# Patient Record
Sex: Female | Born: 1965
Health system: Southern US, Community
[De-identification: ages and names within clinical notes are randomized; demographics above are authoritative.]

## PROBLEM LIST (undated history)

## (undated) DIAGNOSIS — K219 Gastro-esophageal reflux disease without esophagitis: Secondary | ICD-10-CM

## (undated) DIAGNOSIS — D069 Carcinoma in situ of cervix, unspecified: Secondary | ICD-10-CM

## (undated) DIAGNOSIS — H409 Unspecified glaucoma: Secondary | ICD-10-CM

## (undated) DIAGNOSIS — M810 Age-related osteoporosis without current pathological fracture: Secondary | ICD-10-CM

## (undated) DIAGNOSIS — D219 Benign neoplasm of connective and other soft tissue, unspecified: Secondary | ICD-10-CM

## (undated) DIAGNOSIS — T8859XA Other complications of anesthesia, initial encounter: Secondary | ICD-10-CM

## (undated) DIAGNOSIS — K589 Irritable bowel syndrome without diarrhea: Secondary | ICD-10-CM

## (undated) HISTORY — DX: Benign neoplasm of connective and other soft tissue, unspecified: D21.9

## (undated) HISTORY — DX: Gastro-esophageal reflux disease without esophagitis: K21.9

## (undated) HISTORY — DX: Carcinoma in situ of cervix, unspecified: D06.9

## (undated) HISTORY — PX: COLPOSCOPY: SHX161

## (undated) HISTORY — DX: Unspecified glaucoma: H40.9

## (undated) HISTORY — PX: SKIN CANCER EXCISION: SHX779

## (undated) HISTORY — PX: PELVIC LAPAROSCOPY: SHX162

## (undated) HISTORY — DX: Age-related osteoporosis without current pathological fracture: M81.0

## (undated) HISTORY — DX: Other disorders of iron metabolism: E83.19

## (undated) HISTORY — DX: Irritable bowel syndrome, unspecified: K58.9

---

## 1985-09-27 ENCOUNTER — Encounter: Payer: Self-pay | Admitting: Gastroenterology

## 1990-07-01 HISTORY — PX: PELVIC LAPAROSCOPY: SHX162

## 1997-07-01 HISTORY — PX: CERVICAL BIOPSY  W/ LOOP ELECTRODE EXCISION: SUR135

## 1998-05-08 ENCOUNTER — Other Ambulatory Visit: Admission: RE | Admit: 1998-05-08 | Discharge: 1998-05-08 | Payer: Self-pay | Admitting: Obstetrics and Gynecology

## 1998-12-13 ENCOUNTER — Other Ambulatory Visit: Admission: RE | Admit: 1998-12-13 | Discharge: 1998-12-13 | Payer: Self-pay | Admitting: Obstetrics and Gynecology

## 1999-02-28 ENCOUNTER — Other Ambulatory Visit: Admission: RE | Admit: 1999-02-28 | Discharge: 1999-02-28 | Payer: Self-pay | Admitting: Obstetrics and Gynecology

## 1999-07-13 ENCOUNTER — Other Ambulatory Visit: Admission: RE | Admit: 1999-07-13 | Discharge: 1999-07-13 | Payer: Self-pay | Admitting: Obstetrics and Gynecology

## 2000-04-23 ENCOUNTER — Other Ambulatory Visit: Admission: RE | Admit: 2000-04-23 | Discharge: 2000-04-23 | Payer: Self-pay | Admitting: Obstetrics and Gynecology

## 2000-07-18 ENCOUNTER — Encounter: Payer: Self-pay | Admitting: Obstetrics and Gynecology

## 2000-07-18 ENCOUNTER — Ambulatory Visit (HOSPITAL_COMMUNITY): Admission: RE | Admit: 2000-07-18 | Discharge: 2000-07-18 | Payer: Self-pay | Admitting: Obstetrics and Gynecology

## 2000-11-20 ENCOUNTER — Other Ambulatory Visit: Admission: RE | Admit: 2000-11-20 | Discharge: 2000-11-20 | Payer: Self-pay | Admitting: Obstetrics and Gynecology

## 2001-04-15 ENCOUNTER — Encounter: Payer: Self-pay | Admitting: Gastroenterology

## 2001-08-25 ENCOUNTER — Other Ambulatory Visit: Admission: RE | Admit: 2001-08-25 | Discharge: 2001-08-25 | Payer: Self-pay | Admitting: Obstetrics and Gynecology

## 2002-07-01 HISTORY — PX: EXPLORATORY LAPAROTOMY: SUR591

## 2002-09-13 ENCOUNTER — Other Ambulatory Visit: Admission: RE | Admit: 2002-09-13 | Discharge: 2002-09-13 | Payer: Self-pay | Admitting: Obstetrics and Gynecology

## 2003-04-08 ENCOUNTER — Inpatient Hospital Stay (HOSPITAL_COMMUNITY): Admission: RE | Admit: 2003-04-08 | Discharge: 2003-04-10 | Payer: Self-pay | Admitting: Obstetrics and Gynecology

## 2003-04-08 ENCOUNTER — Encounter (INDEPENDENT_AMBULATORY_CARE_PROVIDER_SITE_OTHER): Payer: Self-pay

## 2003-10-25 ENCOUNTER — Encounter: Payer: Self-pay | Admitting: Family Medicine

## 2003-12-28 ENCOUNTER — Other Ambulatory Visit: Admission: RE | Admit: 2003-12-28 | Discharge: 2003-12-28 | Payer: Self-pay | Admitting: Obstetrics and Gynecology

## 2004-01-31 ENCOUNTER — Encounter: Payer: Self-pay | Admitting: Family Medicine

## 2004-02-12 ENCOUNTER — Inpatient Hospital Stay (HOSPITAL_COMMUNITY): Admission: AD | Admit: 2004-02-12 | Discharge: 2004-02-12 | Payer: Self-pay | Admitting: Obstetrics and Gynecology

## 2004-08-02 ENCOUNTER — Ambulatory Visit (HOSPITAL_COMMUNITY): Admission: RE | Admit: 2004-08-02 | Discharge: 2004-08-02 | Payer: Self-pay | Admitting: Specialist

## 2005-02-04 ENCOUNTER — Other Ambulatory Visit: Admission: RE | Admit: 2005-02-04 | Discharge: 2005-02-04 | Payer: Self-pay | Admitting: Obstetrics and Gynecology

## 2005-08-28 ENCOUNTER — Encounter: Payer: Self-pay | Admitting: Family Medicine

## 2005-09-12 ENCOUNTER — Encounter: Payer: Self-pay | Admitting: Family Medicine

## 2005-09-19 ENCOUNTER — Encounter: Payer: Self-pay | Admitting: Family Medicine

## 2006-02-13 ENCOUNTER — Other Ambulatory Visit: Admission: RE | Admit: 2006-02-13 | Discharge: 2006-02-13 | Payer: Self-pay | Admitting: Obstetrics and Gynecology

## 2007-03-18 ENCOUNTER — Other Ambulatory Visit: Admission: RE | Admit: 2007-03-18 | Discharge: 2007-03-18 | Payer: Self-pay | Admitting: Obstetrics and Gynecology

## 2007-05-26 ENCOUNTER — Ambulatory Visit: Payer: Self-pay | Admitting: Gastroenterology

## 2007-05-26 LAB — CONVERTED CEMR LAB
ALT: 16 units/L (ref 0–35)
AST: 15 units/L (ref 0–37)
Albumin: 3.8 g/dL (ref 3.5–5.2)
Alkaline Phosphatase: 65 units/L (ref 39–117)
BUN: 7 mg/dL (ref 6–23)
Basophils Relative: 0.6 % (ref 0.0–1.0)
Bilirubin, Direct: 0.1 mg/dL (ref 0.0–0.3)
CO2: 32 meq/L (ref 19–32)
CRP, High Sensitivity: <1 — ABNORMAL LOW (ref 0.00–5.00)
Calcium: 9 mg/dL (ref 8.4–10.5)
Chloride: 104 meq/L (ref 96–112)
Creatinine, Ser: 0.7 mg/dL (ref 0.4–1.2)
Eosinophils Relative: 1.8 % (ref 0.0–5.0)
Ferritin: 9.9 ng/mL — ABNORMAL LOW (ref 10.0–291.0)
Folate: 12.2 ng/mL
GFR calc Af Amer: 118 mL/min
GFR calc non Af Amer: 98 mL/min
Glucose, Bld: 100 mg/dL — ABNORMAL HIGH (ref 70–99)
HCT: 41.7 % (ref 36.0–46.0)
Hemoglobin: 14.3 g/dL (ref 12.0–15.0)
Iron: 101 ug/dL (ref 42–145)
Lymphocytes Relative: 42.4 % (ref 12.0–46.0)
MCHC: 34.3 g/dL (ref 30.0–36.0)
MCV: 97.7 fL (ref 78.0–100.0)
Monocytes Relative: 4.8 % (ref 3.0–11.0)
Neutrophils Relative %: 50.4 % (ref 43.0–77.0)
Platelets: 277 10*3/uL (ref 150–400)
Potassium: 4 meq/L (ref 3.5–5.1)
RBC: 4.26 M/uL (ref 3.87–5.11)
RDW: 11.5 % (ref 11.5–14.6)
Saturation Ratios: 38.7 % (ref 20.0–50.0)
Sed Rate: 17 mm/hr (ref 0–25)
Sodium: 141 meq/L (ref 135–145)
TSH: 1.57 microintl units/mL (ref 0.35–5.50)
Tissue Transglutaminase Ab, IgA: 0.3 units (ref <7)
Total Bilirubin: 0.7 mg/dL (ref 0.3–1.2)
Total Protein: 6.8 g/dL (ref 6.0–8.3)
Transferrin: 186.4 mg/dL — ABNORMAL LOW (ref >=212.0)
Vitamin B-12: 178 pg/mL — ABNORMAL LOW (ref 211–911)
WBC: 9.8 10*3/uL (ref 4.5–10.5)

## 2007-05-27 ENCOUNTER — Ambulatory Visit (HOSPITAL_COMMUNITY): Admission: RE | Admit: 2007-05-27 | Discharge: 2007-05-27 | Payer: Self-pay | Admitting: Gastroenterology

## 2007-06-12 ENCOUNTER — Ambulatory Visit: Payer: Self-pay | Admitting: Gastroenterology

## 2007-06-19 DIAGNOSIS — R1032 Left lower quadrant pain: Secondary | ICD-10-CM | POA: Insufficient documentation

## 2007-06-19 DIAGNOSIS — N809 Endometriosis, unspecified: Secondary | ICD-10-CM | POA: Insufficient documentation

## 2007-06-19 DIAGNOSIS — K625 Hemorrhage of anus and rectum: Secondary | ICD-10-CM | POA: Insufficient documentation

## 2007-06-19 DIAGNOSIS — K5730 Diverticulosis of large intestine without perforation or abscess without bleeding: Secondary | ICD-10-CM | POA: Insufficient documentation

## 2007-06-19 DIAGNOSIS — K589 Irritable bowel syndrome without diarrhea: Secondary | ICD-10-CM | POA: Insufficient documentation

## 2008-04-12 ENCOUNTER — Encounter: Payer: Self-pay | Admitting: Obstetrics and Gynecology

## 2008-04-12 ENCOUNTER — Other Ambulatory Visit: Admission: RE | Admit: 2008-04-12 | Discharge: 2008-04-12 | Payer: Self-pay | Admitting: Obstetrics and Gynecology

## 2008-04-12 ENCOUNTER — Ambulatory Visit: Payer: Self-pay | Admitting: Obstetrics and Gynecology

## 2008-08-29 LAB — CONVERTED CEMR LAB: Pap Smear: NORMAL

## 2008-09-28 ENCOUNTER — Other Ambulatory Visit: Admission: RE | Admit: 2008-09-28 | Discharge: 2008-09-28 | Payer: Self-pay | Admitting: Obstetrics and Gynecology

## 2008-09-28 ENCOUNTER — Ambulatory Visit: Payer: Self-pay | Admitting: Obstetrics and Gynecology

## 2008-09-28 ENCOUNTER — Encounter: Payer: Self-pay | Admitting: Obstetrics and Gynecology

## 2008-12-19 ENCOUNTER — Telehealth (INDEPENDENT_AMBULATORY_CARE_PROVIDER_SITE_OTHER): Payer: Self-pay | Admitting: *Deleted

## 2008-12-19 ENCOUNTER — Ambulatory Visit: Payer: Self-pay | Admitting: Family Medicine

## 2008-12-19 DIAGNOSIS — M719 Bursopathy, unspecified: Secondary | ICD-10-CM

## 2008-12-19 DIAGNOSIS — R5381 Other malaise: Secondary | ICD-10-CM | POA: Insufficient documentation

## 2008-12-19 DIAGNOSIS — N390 Urinary tract infection, site not specified: Secondary | ICD-10-CM | POA: Insufficient documentation

## 2008-12-19 DIAGNOSIS — R5383 Other fatigue: Secondary | ICD-10-CM | POA: Insufficient documentation

## 2008-12-19 DIAGNOSIS — M67919 Unspecified disorder of synovium and tendon, unspecified shoulder: Secondary | ICD-10-CM | POA: Insufficient documentation

## 2009-03-08 ENCOUNTER — Ambulatory Visit: Payer: Self-pay | Admitting: Family Medicine

## 2009-03-08 DIAGNOSIS — J020 Streptococcal pharyngitis: Secondary | ICD-10-CM | POA: Insufficient documentation

## 2009-03-08 LAB — CONVERTED CEMR LAB: Rapid Strep: POSITIVE

## 2009-04-06 ENCOUNTER — Ambulatory Visit: Payer: Self-pay | Admitting: Family Medicine

## 2009-04-06 DIAGNOSIS — L600 Ingrowing nail: Secondary | ICD-10-CM | POA: Insufficient documentation

## 2009-04-17 ENCOUNTER — Ambulatory Visit: Payer: Self-pay | Admitting: Family Medicine

## 2009-04-17 DIAGNOSIS — M79609 Pain in unspecified limb: Secondary | ICD-10-CM | POA: Insufficient documentation

## 2009-04-24 ENCOUNTER — Ambulatory Visit: Payer: Self-pay | Admitting: Obstetrics and Gynecology

## 2009-04-24 ENCOUNTER — Other Ambulatory Visit: Admission: RE | Admit: 2009-04-24 | Discharge: 2009-04-24 | Payer: Self-pay | Admitting: Obstetrics and Gynecology

## 2009-04-24 ENCOUNTER — Encounter: Payer: Self-pay | Admitting: Obstetrics and Gynecology

## 2009-10-19 ENCOUNTER — Ambulatory Visit: Payer: Self-pay | Admitting: Family Medicine

## 2009-10-19 DIAGNOSIS — S92919A Unspecified fracture of unspecified toe(s), initial encounter for closed fracture: Secondary | ICD-10-CM | POA: Insufficient documentation

## 2010-04-23 ENCOUNTER — Ambulatory Visit: Payer: Self-pay | Admitting: Internal Medicine

## 2010-04-23 DIAGNOSIS — J069 Acute upper respiratory infection, unspecified: Secondary | ICD-10-CM | POA: Insufficient documentation

## 2010-05-02 ENCOUNTER — Other Ambulatory Visit: Admission: RE | Admit: 2010-05-02 | Discharge: 2010-05-02 | Payer: Self-pay | Admitting: Obstetrics and Gynecology

## 2010-05-02 ENCOUNTER — Ambulatory Visit: Payer: Self-pay | Admitting: Obstetrics and Gynecology

## 2010-05-04 ENCOUNTER — Ambulatory Visit: Payer: Self-pay | Admitting: Obstetrics and Gynecology

## 2010-08-02 NOTE — Assessment & Plan Note (Signed)
Summary: CONGESTION,ST/CLE   Vital Signs:  Patient profile:   45 year old female Weight:      188.50 pounds Temp:     98.7 degrees F oral Pulse rate:   76 / minute Pulse rhythm:   regular BP sitting:   110 / 72  (left arm) Cuff size:   large  Vitals Entered By: Selena Batten Dance CMA Duncan Dull) (April 23, 2010 9:10 AM) CC: Congestion   History of Present Illness: CC: congestion  3d h/o drainage back of throat, as well as congestion in head.  Feling achey, body pains, back pain.  No cough.  No fever.  just nasal congestion.  Has tried ibuprofen and nyquil.    No abd pain, n/v/d.  No rashes.  No significant sore throat.  + 2yo son sick at home.  + husband sick as well.  No smokers at home.  Current Medications (verified): 1)  Post-Operative Shoe .... Re: 826.0, Toe Fracture  Allergies: 1)  ! Sulfa  Past History:  Social History: Last updated: 12/19/2008 Occupation: Homemaker Married Never Smoked Alcohol use-no Drug use-no Regular exercise-yes  Review of Systems       per HPI  Physical Exam  General:  WDWN, congested Head:  Normocephalic and atraumatic without obvious abnormalities. No apparent alopecia or balding. Eyes:  vision grossly intact.   Ears:  External ear exam shows no significant lesions or deformities.  Otoscopic examination reveals clear canals, tympanic membranes are intact bilaterally without bulging, retraction, inflammation or discharge. Hearing is grossly normal bilaterally. Nose:  External nasal examination shows no deformity or inflammation. slight nasal erythema Mouth:  Oral mucosa and oropharynx without lesions or exudates.  Teeth in good repair.  slight pharyngeal erythema  MMM Neck:  supple.  minimal AC LAD (tender but no nodes palpated) Lungs:  Normal respiratory effort, chest expands symmetrically. Lungs are clear to auscultation, no crackles or wheezes. Heart:  Normal rate and regular rhythm. S1 and S2 normal without gallop, murmur, click, rub or  other extra sounds. Pulses:  2+ rad pulses Extremities:  no edema Skin:  Intact without suspicious lesions or rashes   Impression & Recommendations:  Problem # 1:  VIRAL URI (ICD-465.9) acute URTI.  treat supportively.  red flags to return disucssed.  to call at end of week if still with sxs, would be concern for possible sinusitis and consider abx treatment.  Complete Medication List: 1)  Post-operative Shoe  .... Re: 826.0, toe fracture  Patient Instructions: 1)  Sounds like you have a viral upper respiratory infection. 2)  Antibiotics are not needed for this.  Viral infections usually take 7-10 days to resolve. 3)  Use medication as prescribed: afrin nasal spray for 2-3 days max, push fluids as much as you can.  Continue ibuprofen.  guaifenesin 400mg  1 1/2 pill in am and at noon with plenty of fluids. 4)  Please return if you are not improving as expected, or if you have high fevers (>101.5) or difficulty swallowing, trouble breathing. 5)  Call us by end of week if not feeling better. 6)  Call clinic with questions.  Pleasure to see you today!    Orders Added: 1)  Est. Patient Level III [99213]    Prior Medications: POST-OPERATIVE SHOE () Re: 826.0, toe fracture Current Allergies (reviewed today): ! SULFA

## 2010-08-02 NOTE — Miscellaneous (Signed)
Summary: Vaccine Records/[facility].  Vaccine Records/[facility].   Imported By: Lanelle Bal 12/20/2008 14:30:42  _____________________________________________________________________  External Attachment:    Type:   Image     Comment:   External Document

## 2010-08-02 NOTE — Assessment & Plan Note (Signed)
Summary: STREP THROAT/[facility]   Vital Signs:  Patient profile:   45 year old female Height:      67 inches Weight:      183 pounds BMI:     28.77 Temp:     98.9 degrees F oral Pulse rate:   84 / minute Pulse rhythm:   regular BP sitting:   100 / 70  (left arm) Cuff size:   regular  Vitals Entered By: Lewanda Rife LPN (March 08, 2009 9:52 AM)  CC:  sorethroat.  History of Present Illness: Here for ST--onset x 2d--aching and ST, fever--worse today --no known strep exposure --son  1 and 1/2 yrs old on ABT currently for ear infection since end of last week --hurts to swallow--cereal this morning  --vomited yesterday morning with post nasal drip  Allergies: 1)  ! Sulfa  Review of Systems      See HPI  Physical Exam  General:  alert, well-developed, well-nourished, and well-hydrated.  sick Ears:  R ear normal and L ear normal.   Nose:  no airflow obstruction, mucosal erythema, and mucosal edema.  sinuses +,- Mouth:  no exudates and pharyngeal erythema.   Lungs:  normal respiratory effort, no intercostal retractions, no accessory muscle use, and normal breath sounds.  moist cough only Cervical Nodes:  shotty tender tonsilar nodes, no anterior cervical adenopathy and no posterior cervical adenopathy.   Psych:  Oriented X3 and good eye contact.     Impression & Recommendations:  Problem # 1:  STREPTOCOCCAL PHARYNGITIS (ICD-034.0) Assessment New  will start on amoxicillin two times a day x 10d gargle freq soft simple foods as tolerated and increased by mouth fluids tylenol or IBP as needed fever The following medications were removed from the medication list:    Augmentin 875-125 Mg Tabs (Amoxicillin-pot clavulanate) .Marland Kitchen... 1 by mouth two times a day Her updated medication list for this problem includes:    Diclofenac Sodium 75 Mg Tbec (Diclofenac sodium) .Marland Kitchen... 1 by mouth two times a day    Motrin Ib 200 Mg Tabs (Ibuprofen) .Marland Kitchen... Three caplets every six hours  Amoxicillin 500 Mg Caps (Amoxicillin) .Marland Kitchen... Take 2 two times a day  Orders: Rapid Strep (16109)  Complete Medication List: 1)  Diclofenac Sodium 75 Mg Tbec (Diclofenac sodium) .Marland Kitchen.. 1 by mouth two times a day 2)  Motrin Ib 200 Mg Tabs (Ibuprofen) .... Three caplets every six hours 3)  Amoxicillin 500 Mg Caps (Amoxicillin) .... Take 2 two times a day Prescriptions: AMOXICILLIN 500 MG CAPS (AMOXICILLIN) take 2 two times a day  #40 x 0   Entered and Authorized by:   Gildardo Griffes FNP   Signed by:   Gildardo Griffes FNP on 03/08/2009   Method used:   Electronically to        [facility]* (retail)       [address]       [place], [place]  [address]       Ph: [phone]       Fax: [phone]   RxID:   [id number]   Current Allergies (reviewed today): ! SULFA  Laboratory Results  Date/Time Received: March 08, 2009 9:54 AM  Date/Time Reported: March 08, 2009 9:54 AM   Other Tests  Rapid Strep: positive  Kit Test Internal QC: Positive   (Normal Range: Negative)

## 2010-08-02 NOTE — Assessment & Plan Note (Signed)
Summary: NEW PT TO EST/CLE   Vital Signs:  Patient profile:   45 year old female Height:      67 inches Weight:      181.0 pounds BMI:     28.45 Temp:     98.2 degrees F oral Pulse rate:   88 / minute Pulse rhythm:   regular BP sitting:   110 / 70  (left arm) Cuff size:   regular  History of Present Illness: Chief complaint new patient  45 year old female:  former patient of Dr. Artis Flock.   Before she left [place] she got terribly sick. Lost her voice, and a lot of swollen gland and phlegm going down her throat. Second week in May went to the urgent care in [place]. Put on some Amox for ten days. this is now been over a month, and she continues to have lymphadenopathy, doesn't feel particularly well as when going down her throat and has some occasional cough. She overall feels sort of weak and has some aches and pains.  R shoulder RTC: no specific instance of injury, however the patient does have a new child, 63-month-old, and has been lifting the child significantly. She does have pain with overhead motion and pain with reaching across her body. She denies a prior dislocation, subluxation, or traumatic fracture. At this point she is only tried some occasional over-the-counter medications, and she is done in rehabilitation. He does not wake her up at night, however most of the pain is anterior and somewhat lateral as well. No numbness or tingling   Preventive Screening-Counseling & Management  Alcohol-Tobacco     Smoking Status: never  Caffeine-Diet-Exercise     Caffeine use/day: none     Does Patient Exercise: yes  Safety-Violence-Falls     Seat Belt Use: yes      Drug Use:  no.    Allergies (verified): 1)  ! Sulfa  Past History:  Past Medical History: UTI'S, CHRONIC IRRITABLE BOWEL SYNDROME DIVERTICULOSIS OF COLON Family Hx of CARCINOMA IN SITU OF COLON  ENDOMETRIOSIS   Past Surgical History: OCT 2004 removal of fibroid tumor in uterus 1992 laproscopy for  endometriosis repeated in 1998  Family History: Family History of Arthritis Family History of Neurological disorder (alzeheimers)  Social History: Occupation: Futures trader Married Never Smoked Alcohol use-no Drug use-no Regular exercise-yes Occupation:  stay at home mom Smoking Status:  never Drug Use:  no Does Patient Exercise:  yes Ethnicity:  White Risk analyst Use:  yes Alcohol:  None Caffeine use/day:  none Education:  College  Review of Systems       REVIEW OF SYSTEMS  GEN: No systemic complaints, no fevers, chills, sweats, or other acute illnesses MSK: Detailed in the HPI GI: tolerating PO intake without difficulty Neuro: No numbness, parasthesias, or tingling associated. Otherwise the pertinent positives of the ROS are noted above.   General:  Complains of fatigue and fever. ENT:  Complains of nasal congestion, postnasal drainage, and sore throat. CV:  Denies chest pain or discomfort. Resp:  Complains of cough.  Physical Exam  General:  Well-developed,well-nourished,in no acute distress; alert,appropriate and cooperative throughout examination Head:  Normocephalic and atraumatic without obvious abnormalities. No apparent alopecia or balding. Eyes:  vision grossly intact.   Ears:  External ear exam shows no significant lesions or deformities.  Otoscopic examination reveals clear canals, tympanic membranes are intact bilaterally without bulging, retraction, inflammation or discharge. Hearing is grossly normal bilaterally. Nose:  External nasal examination shows no deformity  or inflammation. Nasal mucosa are pink and moist without lesions or exudates. Mouth:  Oral mucosa and oropharynx without lesions or exudates.  Teeth in good repair. Neck:  supple.  some LAD Lungs:  Normal respiratory effort, chest expands symmetrically. Lungs are clear to auscultation, no crackles or wheezes. Heart:  Normal rate and regular rhythm. S1 and S2 normal without gallop, murmur, click, rub  or other extra sounds. Abdomen:  Bowel sounds positive,abdomen soft and non-tender without masses, organomegaly or hernias noted. Msk:  Shoulder: R Inspection: No muscle wasting or winging Ecchymosis/edema: neg  AC joint, scapula, clavicle: NT Cervical spine: NT, full ROM Spurling's: neg Abduction: full, 5/5 Flexion: full, 5/5 IR, full, lift-off: 5/5 ER at neutral: full, 5/5 AC crossover: POSITVE Neer: pos Hawkins: pos Drop Test: neg Empty Can: pos Supraspinatus insertion: mild-mod T Bicipital groove: NT Speed's: neg Yergason's: neg Sulcus sign: neg Scapular dyskinesis: none C5-T1 intact  Neuro: Sensation intact Grip 5/5  Extremities:  no edema Neurologic:  alert & oriented X3.   Psych:  Cognition and judgment appear intact. Alert and cooperative with normal attention span and concentration. No apparent delusions, illusions, hallucinations   Impression & Recommendations:  Problem # 1:  ROTATOR CUFF SYNDROME, RIGHT (ICD-726.10) Assessment New Shoulder anatomy was reviewed with the patient using and anatomical model.   Rotator cuff strengthening and scapular stabilization exercises were reviewed with the patient.  A handout was given based on a shoulder program from Dr. Graciella Freer of [facility] and the [facility].  Retraining shoulder mechanics and function was emphasized to the patient with rehab done at least 5-6 days a week.  The patient could benefit from formal PT to assist with scapular stabilization and RTC strengthening.   Problem # 2:  FATIGUE (ICD-780.79) Assessment: New ongoing illness with lymphadenopathy, given the length of illness, greater than one-month, I think a course of antibiotics is reasonable. We'll try to broaden his coverage with Augmentin.  Also given Diflucan in case yeast infection ensues.  very well could be viral illness such as CMV, ebv, or other illness contracted while in [place]. if she continues to do poorly, I would have to do a  broad lab work evaluation to try to identify cause, however at this point after our discussion, she and I are both comfortable with doing some trial prior to do an extensive laboratory workup.  Complete Medication List: 1)  Diclofenac Sodium 75 Mg Tbec (Diclofenac sodium) .Marland Kitchen.. 1 by mouth two times a day 2)  Augmentin 875-125 Mg Tabs (Amoxicillin-pot clavulanate) .Marland Kitchen.. 1 by mouth two times a day 3)  Fluconazole 150 Mg Tabs (Fluconazole) .Marland Kitchen.. 1 by mouth x 1  Patient Instructions: 1)  follow-up 6 weeks Prescriptions: FLUCONAZOLE 150 MG TABS (FLUCONAZOLE) 1 by mouth x 1  #1 x 1   Entered and Authorized by:   Hannah Beat MD   Signed by:   Hannah Beat MD on 12/19/2008   Method used:   Print then Give to Patient   RxID:   [id number] AUGMENTIN 875-125 MG TABS (AMOXICILLIN-POT CLAVULANATE) 1 by mouth two times a day  #20 x 0   Entered and Authorized by:   Hannah Beat MD   Signed by:   Hannah Beat MD on 12/19/2008   Method used:   Print then Give to Patient   RxID:   [id number] DICLOFENAC SODIUM 75 MG TBEC (DICLOFENAC SODIUM) 1 by mouth two times a day  #60 x 1   Entered and Authorized by:  Hannah Beat MD   Signed by:   Hannah Beat MD on 12/19/2008   Method used:   Print then Give to Patient   RxID:   [id number]   Prior Medications (reviewed today): None Current Allergies (reviewed today): ! SULFA  Preventive Care Screening  Pap Smear:    Date:  08/29/2008    Next Due:  08/2009    Results:  normal   Mammogram:    Date:  01/30/2008    Next Due:  01/2009    Results:  normal   Last Tetanus Booster:    Date:  09/29/2008    Results:  Tdap   Prior Values:    Colonoscopy:  Results: Normal. Location:  [facility] .   (04/15/2001)   Prevention & Chronic Care Immunizations   Influenza vaccine: Not documented    Tetanus booster: 09/29/2008: Tdap    Pneumococcal vaccine: Not documented  Other Screening   Pap smear: normal   (08/29/2008)   Pap smear due: 08/2009    Mammogram: normal  (01/30/2008)   Mammogram due: 01/2009    Smoking status: never  (12/19/2008)  Lipids   Total Cholesterol: Not documented   LDL: Not documented   LDL Direct: Not documented   HDL: Not documented   Triglycerides: Not documented

## 2010-08-02 NOTE — Letter (Signed)
Summary: [facility]  [facility]   Imported By: Lanelle Bal 02/06/2009 13:00:23  _____________________________________________________________________  External Attachment:    Type:   Image     Comment:   External Document

## 2010-08-02 NOTE — Letter (Signed)
Summary: Bradd Canary, MD  Bradd Canary, MD   Imported By: Lanelle Bal 02/06/2009 13:06:43  _____________________________________________________________________  External Attachment:    Type:   Image     Comment:   External Document

## 2010-08-02 NOTE — Procedures (Signed)
Summary: Gastroenterology EGD  Gastroenterology EGD   Imported By: Francee Piccolo CMA 07/16/2007 14:21:33  _____________________________________________________________________  External Attachment:    Type:   Image     Comment:   External Document

## 2010-08-02 NOTE — Assessment & Plan Note (Signed)
Summary: INFECTED TOENAIL/[facility]   Vital Signs:  Patient profile:   45 year old female Height:      67 inches Weight:      184.8 pounds BMI:     29.05 Temp:     98.3 degrees F oral Pulse rate:   88 / minute Pulse rhythm:   regular BP sitting:   106 / 70  (left arm) Cuff size:   regular  Vitals Entered By: Benny Lennert CMA Duncan Dull) (April 06, 2009 8:56 AM)  History of Present Illness: Chief complaint infected big toe  Over past few months... nail darkened, has had ingrown toenail on right edge on 1st digit.  Has seen oozing yellow/clear bloody fluid. Very tender, warm.  Has self treated with nail clippers and warm compresses./soaks. 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  No fever.    Has history of toenail fungus.Jennifer Kitchen3-4 years ago.   Problems Prior to Update: 1)  Streptococcal Pharyngitis  (ICD-034.0) 2)  Rotator Cuff Syndrome, Right  (ICD-726.10) 3)  Fatigue  (ICD-780.79) 4)  Uti's, Chronic  (ICD-599.0) 5)  Irritable Bowel Syndrome  (ICD-564.1) 6)  Diverticulosis of Colon  (ICD-562.10) 7)  Rectal Bleeding  (ICD-569.3) 8)  Fh of Carcinoma in Situ of Colon  (ICD-230.3) 9)  Abdominal Pain, Left Lower Quadrant  (ICD-789.04) 10)  S/P Laparoscopy With Excision of Endometrial Tissue  () 11)  Endometriosis  (ICD-617.9)  Current Medications (verified): 1)  Keflex 500 Mg Caps (Cephalexin) .Jennifer Velez.. 1 Tab By Mouth Three Times A Day X 7 Days  Allergies: 1)  ! Sulfa  Past History:  Past medical, surgical, family and social histories (including risk factors) reviewed, and no changes noted (except as noted below).  Past Medical History: Reviewed history from 01/30/2009 and no changes required. UTI'S, CHRONIC IRRITABLE BOWEL SYNDROME DIVERTICULOSIS OF COLON Family Hx of CARCINOMA IN SITU OF COLON  ENDOMETRIOSIS  h/o BPPV  Past Surgical History: Reviewed history from 12/19/2008 and no changes required. OCT 2004 removal of fibroid tumor  in uterus 1992 laproscopy for endometriosis repeated in 1998  Family History: Reviewed history from 12/19/2008 and no changes required. Family History of Arthritis Family History of Neurological disorder (alzeheimers)  Social History: Reviewed history from 12/19/2008 and no changes required. Occupation: Homemaker Married Never Smoked Alcohol use-no Drug use-no Regular exercise-yes  Review of Systems General:  Denies fatigue and fever. CV:  Denies chest pain or discomfort.  Physical Exam  General:  Well-developed,well-nourished,in no acute distress; alert,appropriate and cooperative throughout examination Pulses:  R and L posterior tibial pulses are full and equal bilaterally  Extremities:  right 1st digit, medial edge... .Jennifer Kitchenno fluctuance but indurated tissue medially Clearyellow tinged dried drainage at edge   Impression & Recommendations:  Problem # 1:  INGROWN TOENAIL, INFECTED (ICD-703.0) No clear abcess/paronycia that needs to be drained. Ingrown toenail is primary issue...will treat with warm compresses oral antibiotic, but pt needs to return for toenail removal.  The following medications were removed from the medication list:    Amoxicillin 500 Mg Caps (Amoxicillin) .Jennifer Velez... Take 2 two times a day Her updated medication list for this problem includes:    Keflex 500 Mg Caps (Cephalexin) .Jennifer Velez... 1 tab by mouth three times a day x 7 days  Complete Medication List: 1)  Keflex 500 Mg Caps (Cephalexin) .Jennifer Velez.. 1 tab by mouth three times a day x 7 days  Patient Instructions: 1)  Warm compresses three times a day.  Apply antibiotic ointment. 2)  Treeat with oral antibiotic initially.  3)  Return in 1 week for ingrown toenail.Please have patient make the appointment with Dr. Patsy Lager.  Prescriptions: KEFLEX 500 MG CAPS (CEPHALEXIN) 1 tab by mouth three times a day x 7 days  #21 x 0   Entered and Authorized by:   Kerby Nora MD   Signed by:   Kerby Nora MD on 04/06/2009   Method  used:   Electronically to        [facility]* (retail)       [address]       [place], [place]  [address]       Ph: [phone]       Fax: [phone]   RxID:   [id number]   Current Allergies (reviewed today): ! SULFA

## 2010-08-02 NOTE — Progress Notes (Signed)
Summary: rxn to med  Phone Note Call from Patient Call back at Home Phone [phone]   Caller: Spouse- hank Call For: [place] Summary of Call: pt seen today given med, husband states pt c/o abd pain, diarrhea, dizziness, and itching  since taking med. please advise, [place] [facility] Initial call taken by: Liane Comber,  December 19, 2008 2:38 PM  Follow-up for Phone Call        have her d/c both the voltaren and augmentin  Instead of Augmentin - call in Levaquin 500 mg, 1 by mouth daily x 10 days.  Instead of voltaren, can take ibuprofen or alleve over the counter.  I would stop all of them now, OK to take Benadryl. Unusual reaction - Voltaren can cause nausea in some people (dicolfenac).  So, Stop all meds, drink fluids, take benadryl. If trouble breathing, to ER. Basic nausea, care, BRAT diet Follow-up by: Hannah Beat MD,  December 19, 2008 2:51 PM  Additional Follow-up for Phone Call Additional follow up Details #1::        patient advised and medication called in Additional Follow-up by: Benny Lennert CMA,  December 19, 2008 4:29 PM

## 2010-08-02 NOTE — Assessment & Plan Note (Signed)
Summary: REMOVE TOENAIL   History of Present Illness: 45 year old female presents with R great toe f/u for ingrown toenail.  R medial, has been ongoing now for about 6 months - placed on ABX by Dr. Ermalene Searing -- not sure if this has significantly improved.   Continues to have some toe pain.  Allergies: 1)  ! Sulfa  Past History:  Past medical, surgical, family and social histories (including risk factors) reviewed, and no changes noted (except as noted below).  Past Medical History: Reviewed history from 01/30/2009 and no changes required. UTI'S, CHRONIC IRRITABLE BOWEL SYNDROME DIVERTICULOSIS OF COLON Family Hx of CARCINOMA IN SITU OF COLON  ENDOMETRIOSIS  h/o BPPV  Past Surgical History: Reviewed history from 12/19/2008 and no changes required. OCT 2004 removal of fibroid tumor in uterus 1992 laproscopy for endometriosis repeated in 1998  Family History: Reviewed history from 12/19/2008 and no changes required. Family History of Arthritis Family History of Neurological disorder (alzeheimers)  Social History: Reviewed history from 12/19/2008 and no changes required. Occupation: Homemaker Married Never Smoked Alcohol use-no Drug use-no Regular exercise-yes  Review of Systems       Toe pain no f/c/sweats no n/v/d  Physical Exam  General:  GEN: Well-developed,well-nourished,in no acute distress; alert,appropriate and cooperative throughout examination HEENT: Normocephalic and atraumatic without obvious abnormalities. No apparent alopecia or balding. Ears, externally no deformities PULM: Breathing comfortably in no respiratory distress EXT: No clubbing, cyanosis, or edema PSYCH: Normally interactive. Cooperative during the interview. Pleasant. Friendly and conversant. Not anxious or depressed appearing. Normal, full affect.  Skin:  R great toe with medial redness, swelling, painful to touch, some crusting at edge of nail.   Impression &  Recommendations:  Problem # 1:  PAIN IN SOFT TISSUES OF LIMB (ICD-729.5) Assessment Unchanged Toe pain, Tylenol, Motrin, Vicodin as needed   Problem # 2:  INGROWN TOENAIL, INFECTED (ICD-703.0)  Medial R toenail removed  see below  Her updated medication list for this problem includes:    Keflex 500 Mg Caps (Cephalexin) .Marland Kitchen... 1 tab by mouth three times a day x 7 days  Orders: Nail avulsion, partial or complete (11730)  Complete Medication List: 1)  Keflex 500 Mg Caps (Cephalexin) .Marland Kitchen.. 1 tab by mouth three times a day x 7 days 2)  Hydrocodone-acetaminophen 5-325 Mg Tabs (Hydrocodone-acetaminophen) .Marland Kitchen.. 1 - 2  by mouth up to 4 times per day as needed for pain Prescriptions: HYDROCODONE-ACETAMINOPHEN 5-325 MG TABS (HYDROCODONE-ACETAMINOPHEN) 1 - 2  by mouth up to 4 times per day as needed for pain  #30 x 0   Entered and Authorized by:   Hannah Beat MD   Signed by:   Hannah Beat MD on 04/17/2009   Method used:   Print then Give to Patient   RxID:   [id number]      PROCEDURE:  Toenail Removal Site: R great toenail Anesthesia: 9 cc of 1/2 Marcaine 0.5% and 1/2 Lidocaine 1% in a digital block Procedure: Prepped with betadine x 4, toenail lifters used without difficulty and toenail cutters used to remove medial nail to base. Hypertrophic tissue removed medially with scissors. Phenol applied to base of nail x 2. No complications and minimal bleeding. Disposition: To house

## 2010-08-02 NOTE — Letter (Signed)
Summary: [facility]  [facility]   Imported By: Lanelle Bal 02/06/2009 13:02:53  _____________________________________________________________________  External Attachment:    Type:   Image     Comment:   External Document

## 2010-08-02 NOTE — Procedures (Signed)
Summary: [facility]   Imported By: Francee Piccolo CMA 07/16/2007 14:17:43  _____________________________________________________________________  External Attachment:    Type:   Image     Comment:   External Document

## 2010-08-02 NOTE — Assessment & Plan Note (Signed)
Summary: BROKEN TOE ON LEFT FOOT/[facility]   Vital Signs:  Patient profile:   45 year old female Height:      67 inches Weight:      184.8 pounds BMI:     29.05 Temp:     98.1 degrees F oral Pulse rate:   80 / minute Pulse rhythm:   regular BP sitting:   108 / 82  (left arm) Cuff size:   regular  Vitals Entered By: Benny Lennert CMA Duncan Dull) (October 19, 2009 12:16 PM)  History of Present Illness: Chief complaint ? broken toe  The patient injured her 4th toe approx 2 weeks ago, DOI 08/14/2009.  L 4th toe has been swollen, tender and bruised.  Has been wearing a post-operative shoe that is now falling apart.   h/o multiple fx toes in the past.   REVIEW OF SYSTEMS  GEN: No systemic complaints, no fevers, chills, sweats, or other acute illnesses MSK: Detailed in the HPI GI: tolerating PO intake without difficulty Neuro: No numbness, parasthesias, or tingling associated. Otherwise the pertinent positives of the ROS are noted above.    Allergies: 1)  ! Sulfa  Past History:  Past medical, surgical, family and social histories (including risk factors) reviewed, and no changes noted (except as noted below).  Past Medical History: Reviewed history from 01/30/2009 and no changes required. UTI'S, CHRONIC IRRITABLE BOWEL SYNDROME DIVERTICULOSIS OF COLON Family Hx of CARCINOMA IN SITU OF COLON  ENDOMETRIOSIS  h/o BPPV  Past Surgical History: Reviewed history from 12/19/2008 and no changes required. OCT 2004 removal of fibroid tumor in uterus 1992 laproscopy for endometriosis repeated in 1998  Family History: Reviewed history from 12/19/2008 and no changes required. Family History of Arthritis Family History of Neurological disorder (alzeheimers)  Social History: Reviewed history from 12/19/2008 and no changes required. Occupation: Homemaker Married Never Smoked Alcohol use-no Drug use-no Regular exercise-yes  Physical Exam  General:  GEN:  Well-developed,well-nourished,in no acute distress; alert,appropriate and cooperative throughout examination HEENT: Normocephalic and atraumatic without obvious abnormalities. No apparent alopecia or balding. Ears, externally no deformities PULM: Breathing comfortably in no respiratory distress EXT: No clubbing, cyanosis, or edema PSYCH: Normally interactive. Cooperative during the interview. Pleasant. Friendly and conversant. Not anxious or depressed appearing. Normal, full affect.  Msk:  Echymosis: no Edema: no ROM: full LE B Gait: heel toe, non-antalgic MT pain: no Callus pattern: none Lateral Mall: NT Medial Mall: NT Talus: NT Navicular: NT Cuboid: NT Calcaneous: NT Metatarsals: NT 5th MT: NT Phalanges: MARKEDLY TENDER ON L 4TH TOE Fat Pad: NT Peroneals: NT Post Tib: NT Great Toe: Nml motion Ant Drawer: neg ATFL: NT CFL: NT Deltoid: NT    Impression & Recommendations:  Problem # 1:  CLOSED FRACTURE OF ONE OR MORE PHALANGES OF FOOT (ICD-826.0) Assessment New Xray, 4th toe Indication: pain Findings: There is evidence of a minimally displaced, minimally angulated stable fracture IP fracture of the 4th toe  The patient will be placed in a post-operative shoe for immobilization, cont to wear for the next 2-3 weeks. Tape as able with tearable athletic tape.  A UNIVERSAL FRACTURE CHARGE HAS BEEN APPLIED IN THE CARE OF THIS INJURY.  No co-pay should be applied in the future, and there is a 90 day follow-up period for subsequent care of this injury.   Orders: Toe Fx (36644)  Complete Medication List: 1)  Post-operative Shoe  .... Re: 826.0, toe fracture  Other Orders: Radiology other (Radiology Other) Prescriptions: POST-OPERATIVE SHOE Re: 826.0, toe  fracture  #1 x 0   Entered and Authorized by:   Hannah Beat MD   Signed by:   Hannah Beat MD on 10/19/2009   Method used:   Print then Give to Patient   RxID:   [id number]   Current Allergies (reviewed  today): ! SULFA

## 2010-09-07 ENCOUNTER — Other Ambulatory Visit: Payer: Self-pay | Admitting: Family Medicine

## 2010-09-07 ENCOUNTER — Encounter: Payer: Self-pay | Admitting: Family Medicine

## 2010-09-07 ENCOUNTER — Ambulatory Visit (INDEPENDENT_AMBULATORY_CARE_PROVIDER_SITE_OTHER): Payer: 59 | Admitting: Family Medicine

## 2010-09-07 DIAGNOSIS — J029 Acute pharyngitis, unspecified: Secondary | ICD-10-CM | POA: Insufficient documentation

## 2010-09-07 LAB — MONONUCLEOSIS SCREEN: Mono Screen: NEGATIVE

## 2010-09-07 LAB — CONVERTED CEMR LAB: Rapid Strep: POSITIVE

## 2010-09-08 ENCOUNTER — Encounter: Payer: Self-pay | Admitting: Family Medicine

## 2010-09-11 NOTE — Assessment & Plan Note (Signed)
Summary: ST,HA/CLE   [facility]   Vital Signs:  Patient profile:   45 year old female Height:      67 inches Weight:      181 pounds BMI:     28.45 Temp:     98.4 degrees F oral Pulse rate:   76 / minute Pulse rhythm:   regular BP sitting:   100 / 70  (left arm) Cuff size:   large  Vitals Entered By: Benny Lennert CMA Duncan Dull) (September 07, 2010 9:59 AM)  History of Present Illness: Chief complaint sore throat, headache  Acute Visit History:      The patient complains of headache and sore throat.  These symptoms began 2 weeks ago.  She denies cough, earache, fever, nasal discharge, and nausea.  Other comments include: swollen glands.Marland Kitchen and tender. OSme post nasal drip.        Problems Prior to Update: 1)  Viral Uri  (ICD-465.9) 2)  Closed Fracture of One or More Phalanges of Foot  (ICD-826.0) 3)  Pain in Soft Tissues of Limb  (ICD-729.5) 4)  Pain in Soft Tissues of Limb  (ICD-729.5) 5)  Ingrown Toenail, Infected  (ICD-703.0) 6)  Streptococcal Pharyngitis  (ICD-034.0) 7)  Rotator Cuff Syndrome, Right  (ICD-726.10) 8)  Fatigue  (ICD-780.79) 9)  Uti's, Chronic  (ICD-599.0) 10)  Irritable Bowel Syndrome  (ICD-564.1) 11)  Diverticulosis of Colon  (ICD-562.10) 12)  Rectal Bleeding  (ICD-569.3) 13)  Fh of Carcinoma in Situ of Colon  (ICD-230.3) 14)  Abdominal Pain, Left Lower Quadrant  (ICD-789.04) 15)  S/P Laparoscopy With Excision of Endometrial Tissue  () 16)  Endometriosis  (ICD-617.9)  Current Medications (verified): 1)  Amoxicillin 500 Mg Tabs (Amoxicillin) .... 2 Tab By Mouth Two Times A Day X 10 Days  Allergies: 1)  ! Sulfa  Past History:  Past medical, surgical, family and social histories (including risk factors) reviewed, and no changes noted (except as noted below).  Past Medical History: Reviewed history from 01/30/2009 and no changes required. UTI'S, CHRONIC IRRITABLE BOWEL SYNDROME DIVERTICULOSIS OF COLON Family Hx of CARCINOMA IN SITU OF COLON  ENDOMETRIOSIS    h/o BPPV  Past Surgical History: Reviewed history from 12/19/2008 and no changes required. OCT 2004 removal of fibroid tumor in uterus 1992 laproscopy for endometriosis repeated in 1998  Family History: Reviewed history from 12/19/2008 and no changes required. Family History of Arthritis Family History of Neurological disorder (alzeheimers)  Social History: Reviewed history from 12/19/2008 and no changes required. Occupation: Homemaker Married Never Smoked Alcohol use-no Drug use-no Regular exercise-yes  Review of Systems      See HPI       facial headache and neck soreness General:  Complains of fatigue; full ROM of neck. CV:  Denies chest pain or discomfort. Resp:  Denies shortness of breath, sputum productive, and wheezing.  Physical Exam  General:  Well-developed,well-nourished,in no acute distress; alert,appropriate and cooperative throughout examination Nose:  External nasal examination shows no deformity or inflammation. Nasal mucosa are pink and moist without lesions or exudates. Mouth:  pharynx pink and moist, no exudates, and no posterior lymphoid hypertrophy.   Neck:  cervical lymphadenopathy.   Lungs:  Normal respiratory effort, chest expands symmetrically. Lungs are clear to auscultation, no crackles or wheezes. Heart:  Normal rate and regular rhythm. S1 and S2 normal without gallop, murmur, click, rub or other extra sounds. Pulses:  R and L posterior tibial pulses are full and equal bilaterally  Extremities:  no edema  Impression & Recommendations:  Problem # 1:  PHARYNGITIS, ACUTE (ICD-462) Strep test was borderline positive ... has some symptoms that correspon with this diagnosis.Marland Kitchen but chronicity of symptoms and no fever is unusual.. also having some PND.   Will send for strep culture... check mono as well given fatigue. Will treat emperically with amox.  Her updated medication list for this problem includes:    Amoxicillin 500 Mg Tabs (Amoxicillin)  .Marland Kitchen... 2 tab by mouth two times a day x 10 days  Orders: TLB-Mono Test (Monospot) [id number]) T-Culture, Throat ([id number])  Complete Medication List: 1)  Amoxicillin 500 Mg Tabs (Amoxicillin) .... 2 tab by mouth two times a day x 10 days  Other Orders: Rapid Strep ([id number])  Patient Instructions: 1)  Start antibiotic.Marland Kitchen we will call with lab results early next week. 2)  Call if fever on antibiotics.Marland Kitchen or headhace worsening.  3)  Ibuprofen as needed sore throat. Prescriptions: AMOXICILLIN 500 MG TABS (AMOXICILLIN) 2 tab by mouth two times a day x 10 days  #40 x 0   Entered and Authorized by:   Kerby Nora MD   Signed by:   Kerby Nora MD on 09/07/2010   Method used:   Electronically to        [facility]* (retail)       [address]       [place], [place]  [address]       Ph: [phone]       Fax: [phone]   RxID:   [id number]    Orders Added: 1)  Rapid Strep [[id number]] 2)  TLB-Mono Test (Monospot) [[id number]] 3)  T-Culture, Throat [[id number]] 4)  Est. Patient Level III [32202]    Prior Medications (reviewed today): None Current Allergies (reviewed today): ! SULFA  Laboratory Results    Other Tests  Rapid Strep: positive  Kit Test Internal QC: Negative   (Normal Range: Negative)

## 2010-11-13 NOTE — Assessment & Plan Note (Signed)
[facility]                         GASTROENTEROLOGY OFFICE NOTE   NAME:Arlington, ALANA DAYTON                         MRN:          [id number]  DATE:06/12/2007                            DOB:          1966/05/03    Shahed has really had no response in terms of her gas, bloating, and  vague abdominal discomfort to anticholinergic or probiotic therapy. She  had a CT scan of the abdomen on 05/27/2007 that showed some benign cysts  in her kidneys and spleen, and a 2.1 sonometer uterine fibroid. CT scan  otherwise was unremarkable, as was laboratory data except for a low  serum B12 level of 178 ng%. and a slightly low ferritin of 9.9 nanograms  percent. Her CBC and metabolic profile otherwise were normal and her  celiac panel was negative.   VITAL SIGNS:  Her weight today is 185 pounds and her blood pressure is  92/60, and pulse was 60 and regular.  ABDOMEN:  Unremarkable.   ASSESSMENT:  I think that it is certainly possible that Marnesha has  idiopathic bacterial overgrowth syndrome with associated B12 deficiency.  I have decided at this time to treat her for this condition and see how  she does symptomatically before repeating her colonoscopy that was last  done in 2002. She is also followed by Dr. Eda Paschal and may need follow  up laparoscopic exam depending on her clinical course. She has had  previous resection of a localized uterine fibroid and lysis of  endometrial implants.   RECOMMENDATIONS:  1. Xifaxan 400 mg t.i.d. for 10 days along with continued Align      probiotic therapy.  2. GI follow up in approximately a months' time and decide if further      workup is needed including a colonoscopy and perhaps an endoscopy      and small bowel biopsy.  3. Parental B12 replacement therapy begun today.     Vania Rea. Jarold Motto, MD, Caleen Essex, FAGA  Electronically Signed    DRP/MedQ  DD: 06/12/2007  DT: 06/13/2007  Job #: [id number]   cc:   Reuel Boom L.  Eda Paschal, M.D.

## 2010-11-13 NOTE — Assessment & Plan Note (Signed)
[facility]                         GASTROENTEROLOGY OFFICE NOTE   NAME:Jennifer Velez, Jennifer Velez                         MRN:          [id number]  DATE:05/26/2007                            DOB:          Jul 31, 1965    Ms. Kretchmer is a 45 year old white female, self-referred for evaluation of  abdominal gas, abdominal bloating, left lower quadrant pain, and  alternating diarrhea and constipation.   HISTORY OF PRESENT ILLNESS:  I have known Ms. Commerford for many years.  She  has had recurrent pelvic endometriosis with some colonic involvement  requiring laparoscopic lysis of endometrial implants by Dr. Eda Paschal  going back as far as 1992.  She relates that she also, in 2004, had  removal of a uterine tumor by Dr. Eda Paschal.  I do not have these  records for review.  In any case, she now describes rather persistent  left lower quadrant pain with rather severe abdominal gas, bloating, and  passage of flatus with alternating diarrhea and constipation, mostly  diarrhea.  She has had no rectal bleeding, anorexia, weight loss, fever,  chills, upper GI, or hepatobiliary complaints.  She denies abuse of  sorbitol, fructose, and does not use Lactose in her diet.  Her family of  origin is unclear, and she gives no history of osteoporosis or other  known complications of malabsorption.  She denies any upper GI  complaints such as reflux, dysphagia, never had hepatitis or  pancreatitis.   On reviewing her chart, she had a negative CT scan of the abdomen in  1998.  She relates that she recently had multiple lab data performed by  Dr. Artis Flock which was unremarkable.  She and her husband are in the  process of adopting a child from [place].  She did have endoscopic exam in  1987 and colonoscopy exam in October 2002.   PAST MEDICAL HISTORY:  Otherwise unremarkable without chronic medical  problems.   MEDICATIONS:  None.   ALLERGIES:  SULFA.   FAMILY HISTORY:  Remarkable in that her  mother has colon polyps, and she  had an aunt that has ulcerative colitis.   SOCIAL HISTORY:  She is married and lives with her husband.  She does  not smoke or use ethanol.   REVIEW OF SYSTEMS:  Noncontributory.  Last menstrual period was May 15, 2007.  She denies cardiovascular, pulmonary, genitourinary,  neurologic, orthopedic, endocrine, or neuropsychiatric difficulties  otherwise.   EXAM:  Healthy-appearing white female appearing her stated age.  She is 5 feet 7 inches and weighs 186 pounds.  Blood pressure is 110/78,  pulse 66 and regular.  I could not appreciate stigmata of chronic liver disease or thyromegaly.  Her chest was clear, and she was in a regular rhythm without murmurs,  gallops, or rubs.  ABDOMEN:  No organomegaly, masses, but there was some tenderness over  the left lower quadrant area without a definite mass.  Bowel sounds were  normal.  EXTREMITIES:  Extremities are unremarkable.  MENTAL STATUS:  Clear.  She refused rectal exam.   ASSESSMENT:  Ms. Schoppe has a long history  of irritable bowel syndrome  complicated by endometrial involvement of her rectosigmoid area.  She  has had several surgical procedures and laparoscopies by Dr. Eda Paschal.   RECOMMENDATIONS:  1. Check  pelvic - abdominal CT scan.  2. Consider a followup colonoscopy depending on CT results.  3. Screening laboratory parameters including CBC, sed rate, CRP, liver      profile, and celiac panel.  4. Trial of low gas diet along with Align probiotic therapy.  5. Trial of Bentyl 10 mg  t.i.d. before meals.  6. The patient may need repeat laparoscopic exam per Dr. Eda Paschal      depending on clinical workup and clinical course.     Vania Rea. Jarold Motto, MD, Caleen Essex, FAGA  Electronically Signed    DRP/MedQ  DD: 05/26/2007  DT: 05/26/2007  Job #: [id number]   cc:   Reuel Boom L. Eda Paschal, M.D.

## 2010-11-16 NOTE — Discharge Summary (Signed)
   NAME:  Jennifer Velez, Jennifer Velez                            ACCOUNT NO.:  [id number]   MEDICAL RECORD NO.:  [id number]                   PATIENT TYPE:  INP   LOCATION:  9303                                 FACILITY:  [facility]   PHYSICIAN:  Daniel L. Eda Paschal, M.D.           DATE OF BIRTH:  09/05/1965   DATE OF ADMISSION:  04/08/2003  DATE OF DISCHARGE:  04/10/2003                                 DISCHARGE SUMMARY   HOSPITAL COURSE:  The patient is a 45 year old gravida 1, para 0, AB 1 who  was admitted to the hospital with progressively increasing dysmenorrhea, a  large leiomyomata uteri and a history of endometriosis.  On the day of  admission she was taken to the operating room, a myomectomy and excision of  endometriosis was done through a laparotomy incision.  Postoperatively she  did well and by the second postoperative day she was voiding well, passing  gas, and was ready for discharge.   DISCHARGE MEDICATION:  Motrin for pain relief.   WOUND CARE:  Routine.   FOLLOW UP:  She will return to the office in 2 days for staple removal.   CONDITION ON DISCHARGE:  Improved.   PATHOLOGY:  Final pathology report not available at time of dictation.   DISCHARGE DIAGNOSIS:  Increasing dysmenorrhea with leiomyomata uteri and  endometriosis.   OPERATION:  Myomectomy and excision of endometriosis.                                               Daniel L. Eda Paschal, M.D.    Tonette Bihari  D:  04/10/2003  T:  04/10/2003  Job:  [id number]

## 2010-11-16 NOTE — H&P (Signed)
Jennifer Velez, Jennifer Velez                              ACCOUNT NO.:  [id number]   MEDICAL RECORD NO.:  [id number]                   PATIENT TYPE:   LOCATION:                                       FACILITY:   PHYSICIAN:  Daniel L. Eda Paschal, M.D.           DATE OF BIRTH:   DATE OF ADMISSION:  04/08/2003  DATE OF DISCHARGE:                                HISTORY & PHYSICAL   CHIEF COMPLAINT:  Symptomatic fibroid.   HISTORY OF PRESENT ILLNESS:  The patient is a 45 year old gravida 1, para 0,  AB1 who had presented to the office with progressively increasing  dysmenorrhea.  On ultrasound the patient has an 8 cm leiomyoma which I  believe at least partially explains her severe dysmenorrhea.  It is not  responding to Motrin or other nonsteroidal anti-inflammatory drugs.  She  also has a history of endometriosis and has had two previous laparoscopies  for treatment of that.  As a result of the above and also her desire to  conceive, she enters the hospital now for myomectomy.  We will also excise  any recurrent endometriosis that is found.  She understands that there is a  small, but real risk of hysterectomy as a result of the above.   PAST MEDICAL HISTORY:  1. Two previous laparoscopies for endometriosis.  2. A LEEP done for CIN III.   CURRENT MEDICATIONS:  Nonsteroidal anti-inflammatory drugs.   ALLERGIES:  SULFA.   FAMILY HISTORY:  Completely noncontributory.   REVIEW OF SYSTEMS:  HEENT:  Negative.  CARDIAC:  Negative.  RESPIRATORY:  Negative.  GASTROINTESTINAL:  Negative.  GENITOURINARY:  Negative.  NEUROMUSCULAR:  Negative.  ENDOCRINE:  Negative.  PSYCHIATRIC:  Negative.   PHYSICAL EXAMINATION:  GENERAL:  The patient is a well-developed and well-  nourished female in no acute distress.  VITAL SIGNS:  Blood pressure 110/60, pulse 80 and regular, respirations 16  and nonlabored.  She is afebrile.  HEENT:  All within normal limits.  NECK:  Supple.  Trachea midline.  Thyroid is not  enlarged.  LUNGS:  Clear to P&A.  HEART:  No thrills, heaves, or murmurs.  BREASTS:  No masses.  ABDOMEN:  Soft without guarding, rebound, or masses.  PELVIC:  External is within normal limits.  BUS within normal limits.  Vaginal examination is normal.  Cervix is clean.  Uterus is enlarged by an 8-  9 cm fibroid.  In addition, there is nodularity behind the uterus.  Adnexa  are palpably normal.  Rectovaginal is confirmatory.  EXTREMITIES:  Within normal limits.   ADMISSION IMPRESSION:  1. Large myoma.  2. History of endometriosis.  3. Severe progressive dysmenorrhea.   PLAN:  Exploratory laparotomy with myomectomy and excision of endometriosis.  Daniel L. Eda Paschal, M.D.    Tonette Bihari  D:  04/08/2003  T:  04/08/2003  Job:  [id number]

## 2010-11-16 NOTE — Op Note (Signed)
NAME:  Jennifer Velez, Jennifer Velez                            ACCOUNT NO.:  [id number]   MEDICAL RECORD NO.:  [id number]                   PATIENT TYPE:  INP   LOCATION:  9399                                 FACILITY:  [facility]   PHYSICIAN:  Daniel L. Eda Paschal, M.D.           DATE OF BIRTH:  1965-10-17   DATE OF PROCEDURE:  04/08/2003  DATE OF DISCHARGE:                                 OPERATIVE REPORT   PREOPERATIVE DIAGNOSES:  1. Severe dysmenorrhea.  2. Leiomyomata uteri.  3. Recurrent endometriosis.   POSTOPERATIVE DIAGNOSES:  1. Severe dysmenorrhea.  2. Leiomyomata uteri.  3. Recurrent endometriosis.   OPERATION:  Exploratory laparotomy with myomectomy and excision of  endometriosis of both ovaries.   SURGEON:  Daniel L. Eda Paschal, M.D.   FIRST ASSISTANT:  Raynald Kemp, M.D.   ANESTHESIA:  General endotracheal.   FINDINGS:  At the time of surgery the patient had a very large 8 cm myoma  that came off the posterior wall of the uterus.  Although it was not  pedunculated, it really did not go deep into the myometrium and once it had  been removed, there really was very little myometrial disturbance.  From  that point of view, I think the patient could be allowed to labor.  The  fallopian tubes were normal with luxuriant fimbriae.  Ovaries had evidence  of endometriosis on both of them.  On the left ovary it was a reddish  endometriosis that had formed almost an adhesive-type covering to the ovary,  and it covered almost the entire ovary although it did not limit the  mobility of the ovary.  On the right it was more brownish-black  endometriosis and was more spotty and probably covered a half of the ovary.  When indigo carmine was introduced before the myomectomy and again after the  myomectomy, neither tube was patent; however, she was just finishing her  period and there was some thought that this might have something to do with  it.  It clearly did not look like she had tubal disease  in terms of how her  tubes looked, and a previous HSG had shown patency.  Clearly the tubes had  not been compromised during the surgery.   DESCRIPTION OF PROCEDURE:  After adequate general endotracheal anesthesia,  the patient was placed in the supine position and prepped and draped in the  usual sterile manner.  A Jarcho cannula was inserted into the uterus and a  Foley catheter was put into the bladder.  A Pfannenstiel incision was made.  The fascia was opened transversely.  The peritoneum was entered vertically.  Subcutaneous bleeders were clamped and bovied as encountered.  When the  peritoneal cavity was opened, the above findings were noted.  First the  myoma was grasped and elevated.  A plane could almost be created undermining  the serosa to separate most of the myoma from the  myometrium.  Then the  myoma was cut into and by doing this, the final plane could be seen and the  myoma could be removed.  It weighed 171 g.  The defect in the myometrium,  which was very superficial, was closed with a running locking 0 Vicryl, and  the serosal defect was closed with a 0 Prolene.  Attention was next turned  to the ovaries.  Microsurgical technique was utilized.  Copious irrigation  was done with Ringer's lactate.  A [employer] Micro-Electro unit was utilized  and all areas of endometriosis were either excised or cauterized.  At the  termination of the procedure both ovaries were now clean and there was no  bleeding noted.  Copious irrigation was done with Ringer's lactate.  Two  sponge, needle, and instrument counts were correct.  The peritoneum was  closed with a running 0 Vicryl, subfascial and suprafascial spaces were  copiously irrigated with Ringer's lactate, and then the fascia was closed  with two running 0 Vicryl.  The skin was closed with staples.  Estimated  blood loss for the entire procedure was 500 mL with none replaced.  The  patient tolerated the procedure well and left the  operating room in  satisfactory condition draining clear urine from her Foley catheter.                                               Daniel L. Eda Paschal, M.D.    Tonette Bihari  D:  04/08/2003  T:  04/08/2003  Job:  [id number]

## 2011-01-24 ENCOUNTER — Encounter: Payer: Self-pay | Admitting: Gynecology

## 2011-01-29 ENCOUNTER — Telehealth: Payer: Self-pay

## 2011-01-29 NOTE — Telephone Encounter (Signed)
Yes

## 2011-01-29 NOTE — Telephone Encounter (Signed)
PT SET UP APPT FOR 01-31-11 TO SEE YOU BECAUSE OF NO PERIOD [facility] 2012. SEE YOUR OV NOTE OF 11/2/111. YOU TOLD HER TO CALL IF NO PERIOD IF GREATER  THAN 60 DAYS. SHE STATES SHE JUST GOT "SO BUSY" FORGOT TO CALL TIL RECENTLY.  SINCE SHE MADE THE 01/31/11 APPT. WITH YOU-- SHE STARTED HER PERIOD 01-17-11 THRU 01-23-11. STATES IT WAS INTERMITTENT SEVERE HEAVY BLEEDING WITH CRAMPS. DO YOU STILL WANT HER TO COME IN ON 01-31-11?

## 2011-01-29 NOTE — Telephone Encounter (Signed)
PT. NOTIFIED BY VOICEMAIL TO KEEP 01-31-11 APPT. PER DR. GOTTSEGENS NOTE.

## 2011-01-29 NOTE — Telephone Encounter (Signed)
yes

## 2011-01-31 ENCOUNTER — Ambulatory Visit (INDEPENDENT_AMBULATORY_CARE_PROVIDER_SITE_OTHER): Payer: 59 | Admitting: Obstetrics and Gynecology

## 2011-01-31 ENCOUNTER — Encounter: Payer: Self-pay | Admitting: Obstetrics and Gynecology

## 2011-01-31 DIAGNOSIS — R635 Abnormal weight gain: Secondary | ICD-10-CM

## 2011-01-31 DIAGNOSIS — N951 Menopausal and female climacteric states: Secondary | ICD-10-CM

## 2011-01-31 DIAGNOSIS — Z780 Asymptomatic menopausal state: Secondary | ICD-10-CM

## 2011-01-31 NOTE — Progress Notes (Signed)
The patient came in today with the following history. She had a period in March and then ever had another one until July. She is having severe hot flashes although she said it got better since she started her period in July. She is also having a normal weight gain.  Abdomen is soft without guarding rebound or masses. Pelvic exam: External within normal limits BUS is within normal limits,  vaginal exam within normal limits, cervix is clean ,uterus is normal size and shape, adnexa is within normal limits rectovaginal is confirmatory.  Assessment: 1. Menopausal symptoms with amenorrhea 2. Weight gain  Plan: We do an FSH and a TSH. She will call me for the results in the next. We had a long discussion about HRT. I told her I favored her taking it especially at her age therefore but the risks of her small except for slight increased risk of DVT.

## 2011-02-04 ENCOUNTER — Ambulatory Visit (INDEPENDENT_AMBULATORY_CARE_PROVIDER_SITE_OTHER): Payer: 59 | Admitting: Obstetrics and Gynecology

## 2011-02-04 ENCOUNTER — Encounter: Payer: Self-pay | Admitting: Obstetrics and Gynecology

## 2011-02-04 DIAGNOSIS — N390 Urinary tract infection, site not specified: Secondary | ICD-10-CM

## 2011-02-04 DIAGNOSIS — B373 Candidiasis of vulva and vagina: Secondary | ICD-10-CM

## 2011-02-04 DIAGNOSIS — R82998 Other abnormal findings in urine: Secondary | ICD-10-CM

## 2011-02-04 DIAGNOSIS — R319 Hematuria, unspecified: Secondary | ICD-10-CM

## 2011-02-04 MED ORDER — FLUCONAZOLE 200 MG PO TABS: 200.0000 mg | ORAL_TABLET | Freq: Every day | ORAL | Status: AC

## 2011-02-04 MED ORDER — NITROFURANTOIN MONOHYD MACRO 100 MG PO CAPS: 100.0000 mg | ORAL_CAPSULE | Freq: Two times a day (BID) | ORAL | Status: AC

## 2011-02-04 NOTE — Progress Notes (Signed)
The patient came in today with dysuria, frequency ,back pain, low-grade temperature.this all has an onset of 48-hours.  Abdomen is soft without guarding rebound or masses. External within normal limits, BUN is within normal limits, vaginal exam is positive for KOH, cervix is clean, uterus is normal size and shape but somewhat tender, adnexa fails to reveal masses.  Assessment: 1. UTI  2.vaginitis.  Plan: 1. Macrobid twice a day for 7 days 2. Diflucan 200 mg daily for 4 days 3. Urine sent for culture her. We will tell patient she needs followup culture.

## 2011-02-06 NOTE — Progress Notes (Signed)
Addended by: Trellis Paganini on: 02/06/2011 04:52 PM   Modules accepted: Orders

## 2011-02-06 NOTE — Progress Notes (Signed)
Quick Note:  PT. NOTIFIED OF URINE CULTURE RESULTS PER DR. GOTTSEGENS NOTE BELOW. ______

## 2011-02-20 ENCOUNTER — Encounter: Payer: Self-pay | Admitting: Obstetrics and Gynecology

## 2011-02-20 ENCOUNTER — Other Ambulatory Visit: Payer: 59 | Admitting: Gynecology

## 2011-02-20 DIAGNOSIS — N390 Urinary tract infection, site not specified: Secondary | ICD-10-CM

## 2011-02-20 DIAGNOSIS — Z5189 Encounter for other specified aftercare: Secondary | ICD-10-CM

## 2011-03-14 ENCOUNTER — Telehealth: Payer: Self-pay

## 2011-03-14 NOTE — Telephone Encounter (Addendum)
OPENED IN ERROR

## 2011-05-10 ENCOUNTER — Other Ambulatory Visit (HOSPITAL_COMMUNITY)
Admission: RE | Admit: 2011-05-10 | Discharge: 2011-05-10 | Disposition: A | Discharge status: Home / Self Care | Payer: 59 | Source: Ambulatory Visit | Attending: Obstetrics and Gynecology | Admitting: Obstetrics and Gynecology

## 2011-05-10 ENCOUNTER — Ambulatory Visit (INDEPENDENT_AMBULATORY_CARE_PROVIDER_SITE_OTHER): Payer: 59 | Admitting: Obstetrics and Gynecology

## 2011-05-10 ENCOUNTER — Encounter: Payer: Self-pay | Admitting: Obstetrics and Gynecology

## 2011-05-10 VITALS — BP 124/78 | Ht 67.0 in | Wt 184.0 lb

## 2011-05-10 DIAGNOSIS — N951 Menopausal and female climacteric states: Secondary | ICD-10-CM

## 2011-05-10 DIAGNOSIS — K589 Irritable bowel syndrome without diarrhea: Secondary | ICD-10-CM | POA: Insufficient documentation

## 2011-05-10 DIAGNOSIS — R823 Hemoglobinuria: Secondary | ICD-10-CM

## 2011-05-10 DIAGNOSIS — Z780 Asymptomatic menopausal state: Secondary | ICD-10-CM

## 2011-05-10 DIAGNOSIS — D069 Carcinoma in situ of cervix, unspecified: Secondary | ICD-10-CM | POA: Insufficient documentation

## 2011-05-10 DIAGNOSIS — Z01419 Encounter for gynecological examination (general) (routine) without abnormal findings: Secondary | ICD-10-CM | POA: Insufficient documentation

## 2011-05-10 DIAGNOSIS — E7800 Pure hypercholesterolemia, unspecified: Secondary | ICD-10-CM

## 2011-05-10 NOTE — Progress Notes (Signed)
Patient came to see me today for annual GYN exam. This summer she developed amenorrhea with hot flashes. She had an elevated FSH. We didn't intervene with HRT. Starting in September she's had regular periods without hot flashes. She has dysmenorrhea and menorrhagia due to her endometriosis but for the moment ibuprofen is adequate. She is up-to-date on mammograms. It is time for lab work and she ate 3 hours before office visit.  Physical examination:  Kennon Portela present HEENT within normal limits. Neck: Thyroid not large. No masses. Supraclavicular nodes: not enlarged. Breasts: Examined in both sitting midline position. No skin changes and no masses. Abdomen: Soft no guarding rebound or masses or hernia. Pelvic: External: Within normal limits. BUS: Within normal limits. Vaginal:within normal limits. Good estrogen effect. No evidence of cystocele rectocele or enterocele. Cervix: clean. Uterus: Normal size and shape. Adnexa: No masses. Rectovaginal exam: Confirmatory and negative. Extremities: Within normal limits.  Assessment: Endometriosis. Previous diagnosis of menopause probably reversed.  Plan: We rechecked an [facility]. We'll inform patient.

## 2011-08-12 ENCOUNTER — Other Ambulatory Visit: Payer: Self-pay | Admitting: Family Medicine

## 2011-08-12 ENCOUNTER — Telehealth: Payer: Self-pay | Admitting: Family Medicine

## 2011-08-12 DIAGNOSIS — Z79899 Other long term (current) drug therapy: Secondary | ICD-10-CM

## 2011-08-12 DIAGNOSIS — Z209 Contact with and (suspected) exposure to unspecified communicable disease: Secondary | ICD-10-CM

## 2011-08-12 DIAGNOSIS — Z0000 Encounter for general adult medical examination without abnormal findings: Secondary | ICD-10-CM

## 2011-08-12 NOTE — Telephone Encounter (Signed)
Patient has a physical appointment scheduled for 09/02/11 with labs prior.  Patient does not know what labs are ordered for physicals, but she needs the following labs done for an adoption.  Could you please add these labs to the order if necessary:  HB Antigen; HIV; Blood Count HB HCT WBC; Urinalysis

## 2011-08-12 NOTE — Telephone Encounter (Signed)
Labs ordered for future.

## 2011-08-12 NOTE — Telephone Encounter (Signed)
Same as husband

## 2011-08-22 ENCOUNTER — Other Ambulatory Visit (INDEPENDENT_AMBULATORY_CARE_PROVIDER_SITE_OTHER): Payer: 59

## 2011-08-22 DIAGNOSIS — Z209 Contact with and (suspected) exposure to unspecified communicable disease: Secondary | ICD-10-CM

## 2011-08-22 DIAGNOSIS — Z0000 Encounter for general adult medical examination without abnormal findings: Secondary | ICD-10-CM

## 2011-08-22 DIAGNOSIS — Z79899 Other long term (current) drug therapy: Secondary | ICD-10-CM

## 2011-08-22 LAB — URINALYSIS, ROUTINE W REFLEX MICROSCOPIC
Bilirubin Urine: NEGATIVE
Hgb urine dipstick: NEGATIVE
Ketones, ur: NEGATIVE
Nitrite: NEGATIVE
Specific Gravity, Urine: 1.02 (ref 1.000–1.030)
Total Protein, Urine: NEGATIVE
Urine Glucose: NEGATIVE
Urobilinogen, UA: 0.2 (ref 0.0–1.0)
pH: 7 (ref 5.0–8.0)

## 2011-08-22 LAB — CBC WITH DIFFERENTIAL/PLATELET
Basophils Absolute: 0 10*3/uL (ref 0.0–0.1)
Basophils Relative: 0.7 % (ref 0.0–3.0)
Eosinophils Absolute: 0.1 10*3/uL (ref 0.0–0.7)
Eosinophils Relative: 1.4 % (ref 0.0–5.0)
HCT: 42.1 % (ref 36.0–46.0)
Hemoglobin: 14.4 g/dL (ref 12.0–15.0)
Lymphocytes Relative: 46.4 % — ABNORMAL HIGH (ref 12.0–46.0)
Lymphs Abs: 3 10*3/uL (ref 0.7–4.0)
MCHC: 34.1 g/dL (ref 30.0–36.0)
MCV: 99.5 fl (ref 78.0–100.0)
Monocytes Absolute: 0.4 10*3/uL (ref 0.1–1.0)
Monocytes Relative: 5.9 % (ref 3.0–12.0)
Neutro Abs: 2.9 10*3/uL (ref 1.4–7.7)
Neutrophils Relative %: 45.6 % (ref 43.0–77.0)
Platelets: 254 10*3/uL (ref 150.0–400.0)
RBC: 4.23 Mil/uL (ref 3.87–5.11)
RDW: 12.5 % (ref 11.5–14.6)
WBC: 6.4 10*3/uL (ref 4.5–10.5)

## 2011-08-22 LAB — BASIC METABOLIC PANEL
BUN: 12 mg/dL (ref 6–23)
CO2: 30 mEq/L (ref 19–32)
Calcium: 9 mg/dL (ref 8.4–10.5)
Chloride: 103 mEq/L (ref 96–112)
Creatinine, Ser: 0.7 mg/dL (ref 0.4–1.2)
GFR: 95.26 mL/min (ref >60.00)
Glucose, Bld: 102 mg/dL — ABNORMAL HIGH (ref 70–99)
Potassium: 4 mEq/L (ref 3.5–5.1)
Sodium: 140 mEq/L (ref 135–145)

## 2011-08-23 LAB — HIV ANTIBODY (ROUTINE TESTING W REFLEX): HIV: NONREACTIVE

## 2011-08-23 LAB — HEPATITIS B SURFACE ANTIBODY, QUANTITATIVE: Hepatitis B-Post: 113 m[IU]/mL

## 2011-08-26 ENCOUNTER — Other Ambulatory Visit: Payer: 59

## 2011-09-02 ENCOUNTER — Encounter: Payer: Self-pay | Admitting: Family Medicine

## 2011-09-02 ENCOUNTER — Ambulatory Visit (INDEPENDENT_AMBULATORY_CARE_PROVIDER_SITE_OTHER): Payer: 59 | Admitting: Family Medicine

## 2011-09-02 DIAGNOSIS — Z0000 Encounter for general adult medical examination without abnormal findings: Secondary | ICD-10-CM

## 2011-09-02 NOTE — Progress Notes (Signed)
Patient Name: Jennifer Velez Date of Birth: [birth year] Medical Record Number: [id number] Gender: female Date of Encounter: 09/02/2011  History of Present Illness:  Jennifer Velez is a 46 y.o. very pleasant female patient who presents with the following:  Health Maintenance Summary Reviewed and updated, unless pt declines services.  Tobacco History Reviewed. Non-smoker Alcohol: No concerns, no excessive use Exercise Habits: Some activity, rec at least 30 mins 5 times a week STD concerns: none Drug Use: None Menses regular: no - going through menopause Lumps or breast concerns: no  Health Maintenance  Topic Date Due  . Mammogram  02/20/2012  . Influenza Vaccine  03/31/2012  . Pap Smear  05/09/2012  . Tetanus/tdap  09/30/2018   Labs reviewed with the patient.   CBC:    Component Value Date/Time   WBC 6.4 08/22/2011 0925   HGB 14.4 08/22/2011 0925   HCT 42.1 08/22/2011 0925   PLT 254.0 08/22/2011 0925   MCV 99.5 08/22/2011 0925   NEUTROABS 2.9 08/22/2011 0925   LYMPHSABS 3.0 08/22/2011 0925   MONOABS 0.4 08/22/2011 0925   EOSABS 0.1 08/22/2011 0925   BASOSABS 0.0 08/22/2011 0925    Basic Metabolic Panel:    Component Value Date/Time   NA 140 08/22/2011 0925   K 4.0 08/22/2011 0925   CL 103 08/22/2011 0925   CO2 30 08/22/2011 0925   BUN 12 08/22/2011 0925   CREATININE 0.7 08/22/2011 0925   GLUCOSE 102* 08/22/2011 0925   CALCIUM 9.0 08/22/2011 0925    Lab Results  Component Value Date   ALT 16 05/26/2007   AST 15 05/26/2007   ALKPHOS 65 05/26/2007   BILITOT 0.7 05/26/2007    Patient Active Problem List  Diagnoses  . DIVERTICULOSIS OF COLON  . IRRITABLE BOWEL SYNDROME  . UTI'S, CHRONIC  . ENDOMETRIOSIS  . FATIGUE  . ABDOMINAL PAIN, LEFT LOWER QUADRANT  . CLOSED FRACTURE OF ONE OR MORE PHALANGES OF FOOT  . CIN III (cervical intraepithelial neoplasia III)   Past Medical History  Diagnosis Date  . Endometriosis   . Fibroid   . CIN III (cervical intraepithelial neoplasia  III)   . IBS (irritable bowel syndrome)    Past Surgical History  Procedure Date  . Pelvic laparoscopy   . Cervical biopsy  w/ loop electrode excision 1999  . Exploratory laparotomy 2004    myomectomy and excision endometriosis  . Colposcopy    History  Substance Use Topics  . Smoking status: Never Smoker   . Smokeless tobacco: Never Used  . Alcohol Use: No   Family History  Problem Relation Age of Onset  . Diverticulitis Mother   . Alzheimer's disease Maternal Aunt   . Alzheimer's disease Maternal Grandmother    Allergies  Allergen Reactions  . Sulfonamide Derivatives    No current outpatient prescriptions on file prior to visit.     Medication list has been reviewed and updated.  Review of Systems:  General: Denies fever, chills, sweats. No significant weight loss. Eyes: Denies blurring,significant itching ENT: Denies earache, sore throat, and hoarseness.  Cardiovascular: Denies chest pains, palpitations, dyspnea on exertion,  Respiratory: Denies cough, dyspnea at rest,wheeezing Breast: no concerns about lumps GI: Denies nausea, vomiting, diarrhea, constipation, change in bowel habits, abdominal pain, melena, hematochezia GU: Denies dysuria, hematuria, urinary hesitancy, nocturia, denies STD risk, no concerns about discharge Musculoskeletal: INTERMITTENT BACK PAIN Derm: Denies rash, itching Neuro: Denies  paresthesias, frequent falls, frequent headaches Psych: Denies depression, anxiety Endocrine: Denies cold intolerance,  heat intolerance, polydipsia Heme: Denies enlarged lymph nodes Allergy: No hayfever   Physical Examination: Filed Vitals:   09/02/11 0854  BP: 120/78  Pulse: 74  Temp: 98.1 F (36.7 C)  TempSrc: Oral  Height: 5\' 7"  (1.702 m)  Weight: 161 lb 6.4 oz (82.736 kg)  SpO2: 98%    Body mass index is 28.57 kg/(m^2).   Wt Readings from Last 3 Encounters:  09/02/11 182 lb 6.4 oz (82.736 kg)  05/10/11 184 lb (83.462 kg)  09/07/10 181 lb  (82.101 kg)    GEN: well developed, well nourished, no acute distress Eyes: conjunctiva and lids normal, PERRLA, EOMI ENT: TM clear, nares clear, oral exam WNL Neck: supple, no lymphadenopathy, no thyromegaly, no JVD Pulm: clear to auscultation and percussion, respiratory effort normal CV: regular rate and rhythm, S1-S2, no murmur, rub or gallop, no bruits Chest: no scars, masses, no lumps BREAST: defer GI: soft, non-tender; no hepatosplenomegaly, masses; active bowel sounds all quadrants GU: defer Lymph: no cervical, axillary or inguinal adenopathy MSK: gait normal, muscle tone and strength WNL, no joint swelling, effusions, discoloration, crepitus. Essentially normal range of motion at the waist. Some mild tenderness at the SI joints as well as in the paraspinal region around L4-S1. SKIN: clear, good turgor, color WNL, no rashes, lesions, or ulcerations Neuro: normal mental status, normal strength, sensation, and motion Psych: alert; oriented to person, place and time, normally interactive and not anxious or depressed in appearance.   Assessment and Plan: Health Maint: The patient's preventative maintenance and recommended screening tests for an annual wellness exam were reviewed in full today. Brought up to date unless services declined.  Counselled on the importance of diet, exercise, and its role in overall health and mortality. The patient's FH and SH was reviewed, including their home life, tobacco status, and drug and alcohol status.  All adoption forms completed  Given basic back HEP

## 2011-11-14 ENCOUNTER — Telehealth: Payer: Self-pay | Admitting: Family Medicine

## 2011-11-14 NOTE — Telephone Encounter (Signed)
The patient's questions were forwarded to billing for review and awaiting response.  Meantime, patient has called to check on status and I explained I would send to another person/supervisor for response.  Sent to Anabell Trisha Mangle and Cydney Ok for review.

## 2011-12-05 NOTE — Telephone Encounter (Signed)
Jennifer Velez contacted [employer] and they said that the were recoding and rebilling. Notified husband.

## 2012-03-04 ENCOUNTER — Encounter: Payer: Self-pay | Admitting: Obstetrics and Gynecology

## 2012-05-13 ENCOUNTER — Encounter: Payer: 59 | Admitting: Obstetrics and Gynecology

## 2012-06-11 ENCOUNTER — Encounter: Payer: Self-pay | Admitting: Obstetrics and Gynecology

## 2012-06-11 ENCOUNTER — Ambulatory Visit (INDEPENDENT_AMBULATORY_CARE_PROVIDER_SITE_OTHER): Payer: 59 | Admitting: Obstetrics and Gynecology

## 2012-06-11 VITALS — BP 120/74 | Ht 67.5 in | Wt 182.0 lb

## 2012-06-11 DIAGNOSIS — Z780 Asymptomatic menopausal state: Secondary | ICD-10-CM

## 2012-06-11 DIAGNOSIS — D219 Benign neoplasm of connective and other soft tissue, unspecified: Secondary | ICD-10-CM | POA: Insufficient documentation

## 2012-06-11 DIAGNOSIS — N809 Endometriosis, unspecified: Secondary | ICD-10-CM | POA: Insufficient documentation

## 2012-06-11 DIAGNOSIS — K589 Irritable bowel syndrome without diarrhea: Secondary | ICD-10-CM | POA: Insufficient documentation

## 2012-06-11 DIAGNOSIS — Z01419 Encounter for gynecological examination (general) (routine) without abnormal findings: Secondary | ICD-10-CM

## 2012-06-11 LAB — CBC WITH DIFFERENTIAL/PLATELET
Basophils Absolute: 0 10*3/uL (ref 0.0–0.1)
Basophils Relative: 1 % (ref 0–1)
Eosinophils Absolute: 0.1 10*3/uL (ref 0.0–0.7)
Eosinophils Relative: 2 % (ref 0–5)
HCT: 39.5 % (ref 36.0–46.0)
Hemoglobin: 14.3 g/dL (ref 12.0–15.0)
Lymphocytes Relative: 45 % (ref 12–46)
Lymphs Abs: 2.3 10*3/uL (ref 0.7–4.0)
MCH: 34.1 pg — ABNORMAL HIGH (ref 26.0–34.0)
MCHC: 36.2 g/dL — ABNORMAL HIGH (ref 30.0–36.0)
MCV: 94.3 fL (ref 78.0–100.0)
Monocytes Absolute: 0.4 10*3/uL (ref 0.1–1.0)
Monocytes Relative: 8 % (ref 3–12)
Neutro Abs: 2.4 10*3/uL (ref 1.7–7.7)
Neutrophils Relative %: 44 % (ref 43–77)
Platelets: 273 10*3/uL (ref 150–400)
RBC: 4.19 MIL/uL (ref 3.87–5.11)
RDW: 13.4 % (ref 11.5–15.5)
WBC: 5.2 10*3/uL (ref 4.0–10.5)

## 2012-06-11 LAB — LIPID PANEL
Cholesterol: 179 mg/dL (ref 0–200)
HDL: 46 mg/dL (ref >39)
LDL Cholesterol: 117 mg/dL — ABNORMAL HIGH (ref 0–99)
Total CHOL/HDL Ratio: 3.9 Ratio
Triglycerides: 80 mg/dL (ref <150)
VLDL: 16 mg/dL (ref 0–40)

## 2012-06-11 NOTE — Progress Notes (Signed)
Patient came back to see me today for her annual GYN exam. Last year she had an elevated FSH. She has had just one episode of bleeding for short period of time. For a while she had significant hot flashes but they have resolved. She has not had a baseline bone density. She had her mammogram this year. She does her blood work elsewhere. She has a strong history of endometriosis. She has had 2 laparoscopies for this. She also had an exploratory laparotomy with myomectomy and excision of endometriosis of both ovaries in 2004. She had a LEEP for CIN 3 in 1999 and has had normal Pap smears since then. Her last Pap smear was 2012. She has adopted one child and is getting ready to adopt the second child. She is not having any pelvic pain.  Physical examination:Jennifer Velez present. HEENT within normal limits. Neck: Thyroid not large. No masses. Supraclavicular nodes: not enlarged. Breasts: Examined in both sitting and lying  position. No skin changes and no masses. Abdomen: Soft no guarding rebound or masses or hernia. Pelvic: External: Within normal limits. BUS: Within normal limits. Vaginal:within normal limits. Good estrogen effect. No evidence of cystocele rectocele or enterocele. Cervix: clean. Uterus: Normal size and shape. Adnexa: No masses. Rectovaginal exam: Confirmatory and negative. Extremities: Within normal limits.  Assessment: #1. CIN-3 #2. Endometriosis  Plan: Continue yearly mammograms. Schedule  bone density. Call when necessary need for HRT. The new Pap smear guidelines were discussed with the patient. Pap done.

## 2012-06-11 NOTE — Patient Instructions (Signed)
Schedule  bone density. Report abnormal bleeding.

## 2012-06-12 LAB — URINALYSIS W MICROSCOPIC + REFLEX CULTURE
Bacteria, UA: NONE SEEN
Bilirubin Urine: NEGATIVE
Casts: NONE SEEN
Crystals: NONE SEEN
Glucose, UA: NEGATIVE mg/dL
Hgb urine dipstick: NEGATIVE
Ketones, ur: NEGATIVE mg/dL
Leukocytes, UA: NEGATIVE
Nitrite: NEGATIVE
Protein, ur: NEGATIVE mg/dL
Specific Gravity, Urine: 1.017 (ref 1.005–1.030)
Squamous Epithelial / LPF: NONE SEEN
Urobilinogen, UA: 0.2 mg/dL (ref 0.0–1.0)
pH: 7 (ref 5.0–8.0)

## 2012-10-20 ENCOUNTER — Ambulatory Visit (INDEPENDENT_AMBULATORY_CARE_PROVIDER_SITE_OTHER): Payer: 59

## 2012-10-20 DIAGNOSIS — M8580 Other specified disorders of bone density and structure, unspecified site: Secondary | ICD-10-CM

## 2012-10-20 DIAGNOSIS — Z780 Asymptomatic menopausal state: Secondary | ICD-10-CM

## 2012-10-20 DIAGNOSIS — M899 Disorder of bone, unspecified: Secondary | ICD-10-CM

## 2012-10-22 ENCOUNTER — Other Ambulatory Visit: Payer: Self-pay | Admitting: Gynecology

## 2012-10-22 DIAGNOSIS — M8580 Other specified disorders of bone density and structure, unspecified site: Secondary | ICD-10-CM

## 2012-10-26 ENCOUNTER — Encounter: Payer: Self-pay | Admitting: Gynecology

## 2012-10-29 ENCOUNTER — Other Ambulatory Visit: Payer: Self-pay | Admitting: *Deleted

## 2012-10-29 DIAGNOSIS — M8580 Other specified disorders of bone density and structure, unspecified site: Secondary | ICD-10-CM

## 2012-11-04 ENCOUNTER — Telehealth: Payer: Self-pay | Admitting: *Deleted

## 2012-11-04 NOTE — Telephone Encounter (Signed)
Pts husband called c/o pt throwing up with bile and green stuff in vomit and it has an odor. They called at 445pm and she has been sick since 12noon today. I advised an urgent care or ER visit. He did mention that she was bleeding vaginally too. I advised to get the vomiting under control then to follow up with [place] regarding her bleeding due to her being in menopause. They agreed that she would go to urgent care or er. KW

## 2012-11-05 ENCOUNTER — Ambulatory Visit (INDEPENDENT_AMBULATORY_CARE_PROVIDER_SITE_OTHER): Payer: 59 | Admitting: Gynecology

## 2012-11-05 ENCOUNTER — Encounter: Payer: Self-pay | Admitting: Gynecology

## 2012-11-05 ENCOUNTER — Other Ambulatory Visit: Payer: Self-pay | Admitting: Gynecology

## 2012-11-05 DIAGNOSIS — M549 Dorsalgia, unspecified: Secondary | ICD-10-CM

## 2012-11-05 DIAGNOSIS — M949 Disorder of cartilage, unspecified: Secondary | ICD-10-CM

## 2012-11-05 DIAGNOSIS — N950 Postmenopausal bleeding: Secondary | ICD-10-CM

## 2012-11-05 DIAGNOSIS — M8580 Other specified disorders of bone density and structure, unspecified site: Secondary | ICD-10-CM

## 2012-11-05 DIAGNOSIS — M899 Disorder of bone, unspecified: Secondary | ICD-10-CM

## 2012-11-05 DIAGNOSIS — R32 Unspecified urinary incontinence: Secondary | ICD-10-CM

## 2012-11-05 LAB — CBC WITH DIFFERENTIAL/PLATELET
Basophils Absolute: 0 10*3/uL (ref 0.0–0.1)
Basophils Relative: 0 % (ref 0–1)
Eosinophils Absolute: 0 10*3/uL (ref 0.0–0.7)
Eosinophils Relative: 0 % (ref 0–5)
HCT: 40.7 % (ref 36.0–46.0)
Hemoglobin: 14.3 g/dL (ref 12.0–15.0)
Lymphocytes Relative: 18 % (ref 12–46)
Lymphs Abs: 1.1 10*3/uL (ref 0.7–4.0)
MCH: 34 pg (ref 26.0–34.0)
MCHC: 35.1 g/dL (ref 30.0–36.0)
MCV: 96.9 fL (ref 78.0–100.0)
Monocytes Absolute: 0.5 10*3/uL (ref 0.1–1.0)
Monocytes Relative: 8 % (ref 3–12)
Neutro Abs: 4.5 10*3/uL (ref 1.7–7.7)
Neutrophils Relative %: 74 % (ref 43–77)
Platelets: 226 10*3/uL (ref 150–400)
RBC: 4.2 MIL/uL (ref 3.87–5.11)
RDW: 13.4 % (ref 11.5–15.5)
WBC: 6.1 10*3/uL (ref 4.0–10.5)

## 2012-11-05 LAB — URINALYSIS W MICROSCOPIC + REFLEX CULTURE
Casts: NONE SEEN
Glucose, UA: NEGATIVE mg/dL
Ketones, ur: 15 mg/dL — AB
Leukocytes, UA: NEGATIVE
Nitrite: NEGATIVE
Protein, ur: 100 mg/dL — AB
Specific Gravity, Urine: 1.025 (ref 1.005–1.030)
Urobilinogen, UA: 1 mg/dL (ref 0.0–1.0)
pH: 5.5 (ref 5.0–8.0)

## 2012-11-05 LAB — TSH: TSH: 0.707 u[IU]/mL (ref 0.350–4.500)

## 2012-11-05 LAB — HCG, SERUM, QUALITATIVE: Preg, Serum: NEGATIVE

## 2012-11-05 MED ORDER — MEGESTROL ACETATE 20 MG PO TABS: 20.0000 mg | ORAL_TABLET | Freq: Every day | ORAL | Status: DC

## 2012-11-05 NOTE — Patient Instructions (Signed)
Follow up for ultrasound as scheduled 

## 2012-11-05 NOTE — Progress Notes (Addendum)
Patient presents complaining of vaginal bleeding. Patient of Dr. Verl Dicker who is over one year without bleeding and elevated FSH historically. Noted this past week breast tenderness and bloating in a premenstrual type feeling and then started bleeding in the last 24-40 hours fairly heavily. Also episode of vomiting in the last 48 hours where she also has become incontinent with the vomiting. On questioning she does have a history of SUI symptoms with loss of urine with coughing sneezing laughing. Does have history of leiomyoma and endometriosis status post exploratory laparotomy 2004 by Dr. Eda Paschal. Also notes some low back pain although this is been more of a chronic issue and she does a lot of yard work with bending and stooping. No neurologic symptoms such as leg pain radiation numbness tingling weakness. Lastly recently DEXA 09/2012 T score at -2.1 FRAX 4.1%/0.4%.  Exam was Radiographer, therapeutic Spine straight without paramount muscle spasm or point tenderness. No CVA tenderness. Pelvic external BUS vagina with menses type flow. Cervix grossly normal. Uterus retroverted grossly normal in size midline mobile nontender. Adnexa without masses or tenderness.  Assessment and plan: 1. Postmenopausal bleeding. History of early menopause. Sounds as if she had an aberrant ovulation historically with breast tenderness and bloating preceding the bleeding. We'll check baseline CBC hCG and sonohysterogram. Discussed various possibilities and differential diagnoses with her. Patient will follow up for results and ultrasound. 2. Low back pain/nausea vomiting. Urine does show rbc's. I discussed possibilities of stone but again difficult to interpret given the vaginal bleeding. She has no overt CVA tenderness or abdominal symptoms on exam. Will monitor at present. Would consider urologic evaluation if continues.  I suspect her low back pain is more orthopedic in a given the names of practice is to call and be evaluated this  and seems to be more of a chronic issue. 3. Stress urinary incontinence. Chronic in nature. Reviewed options to include Kegel exercises versus urologic referral to consider surgery. Patient would prefer to monitor at present. 4. Osteopenia T score -2.1 FRAX 4.1%/0.4%. Check vitamin D TSH. Increase calcium vitamin D reviewed. Increased weight-bearing exercise discussed. She is active on the farm working outside. Plan repeat DEXA in 2 years for trending and consideration for treatment if worsening.

## 2012-11-06 LAB — VITAMIN D 25 HYDROXY (VIT D DEFICIENCY, FRACTURES): Vit D, 25-Hydroxy: 33 ng/mL (ref 30–89)

## 2012-11-08 LAB — URINE CULTURE: Colony Count: >=100000

## 2012-11-09 ENCOUNTER — Encounter: Payer: Self-pay | Admitting: Gynecology

## 2012-11-09 ENCOUNTER — Ambulatory Visit (INDEPENDENT_AMBULATORY_CARE_PROVIDER_SITE_OTHER): Payer: 59

## 2012-11-09 ENCOUNTER — Ambulatory Visit (INDEPENDENT_AMBULATORY_CARE_PROVIDER_SITE_OTHER): Payer: 59 | Admitting: Gynecology

## 2012-11-09 DIAGNOSIS — D259 Leiomyoma of uterus, unspecified: Secondary | ICD-10-CM

## 2012-11-09 DIAGNOSIS — N950 Postmenopausal bleeding: Secondary | ICD-10-CM

## 2012-11-09 DIAGNOSIS — D252 Subserosal leiomyoma of uterus: Secondary | ICD-10-CM

## 2012-11-09 DIAGNOSIS — D251 Intramural leiomyoma of uterus: Secondary | ICD-10-CM

## 2012-11-09 DIAGNOSIS — N839 Noninflammatory disorder of ovary, fallopian tube and broad ligament, unspecified: Secondary | ICD-10-CM

## 2012-11-09 DIAGNOSIS — M549 Dorsalgia, unspecified: Secondary | ICD-10-CM

## 2012-11-09 DIAGNOSIS — N8300 Follicular cyst of ovary, unspecified side: Secondary | ICD-10-CM

## 2012-11-09 NOTE — Patient Instructions (Addendum)
Office will call with biopsy results 

## 2012-11-09 NOTE — Progress Notes (Signed)
Patient presents for sonohysterogram due to history of postmenopausal/perimenopausal bleeding. She states she is almost one year out from her LMP although not quite one year. Had episode of bleeding per my prior note. Had elevated FSH per Dr. Eda Paschal in the past.  Ultrasound shows uterus is overall normal in size with endometrial echo 12.7 mm. Several small myomas noted the largest 27 mm. Right and left ovaries grossly normal. Probable corpus luteum left ovary. Sonohysterogram performed sterile technique easy catheter introduction good distention no abnormalities noted. Endometrial sample taken. Patient tolerated well.  Assessment and plan: Peri/postmenopausal bleeding which I think was ovulatory given her other symptoms of bloating breast tenderness PMS type symptoms. Ultrasound overall was normal. Does have a generous endometrium which I think is in response to the ovulation. Endometrial sample taken. At this point recommend observation as long as biopsy is normal. Assuming no further bleeding then we'll monitor. If recurrent bleeding or other issues she knows to call.

## 2012-11-11 ENCOUNTER — Other Ambulatory Visit: Payer: Self-pay | Admitting: *Deleted

## 2012-11-11 ENCOUNTER — Telehealth: Payer: Self-pay | Admitting: *Deleted

## 2012-11-11 MED ORDER — CIPROFLOXACIN HCL 250 MG PO TABS: 250.0000 mg | ORAL_TABLET | Freq: Two times a day (BID) | ORAL | Status: DC

## 2012-11-11 NOTE — Telephone Encounter (Signed)
Yes I would take one Megace pill a day for the next several days. If the bleeding continues I would recommend office visit.

## 2012-11-11 NOTE — Telephone Encounter (Signed)
Pt had SHGM on Monday had some spotting after procedure which she knew that was expected, but pt said yesterday bleeding increased and today heavy. She does have some megace left over asked if you want her to take some? Please advise

## 2012-11-11 NOTE — Telephone Encounter (Signed)
Pt informed with the below note. 

## 2012-11-30 ENCOUNTER — Telehealth: Payer: Self-pay | Admitting: *Deleted

## 2012-11-30 NOTE — Telephone Encounter (Signed)
Pt called stated has been seen 2x lately with bleeding. And is still bleeding. Per last telephone call from pt, Dr Audie Box advised Ov if still had problems with it. I left this message on her VM. KW

## 2012-12-02 ENCOUNTER — Ambulatory Visit (INDEPENDENT_AMBULATORY_CARE_PROVIDER_SITE_OTHER): Payer: 59 | Admitting: Gynecology

## 2012-12-02 ENCOUNTER — Encounter: Payer: Self-pay | Admitting: Gynecology

## 2012-12-02 DIAGNOSIS — N920 Excessive and frequent menstruation with regular cycle: Secondary | ICD-10-CM

## 2012-12-02 LAB — CBC WITH DIFFERENTIAL/PLATELET
Basophils Absolute: 0.1 10*3/uL (ref 0.0–0.1)
Basophils Relative: 1 % (ref 0–1)
Eosinophils Absolute: 0.1 10*3/uL (ref 0.0–0.7)
Eosinophils Relative: 1 % (ref 0–5)
HCT: 37.5 % (ref 36.0–46.0)
Hemoglobin: 13.3 g/dL (ref 12.0–15.0)
Lymphocytes Relative: 52 % — ABNORMAL HIGH (ref 12–46)
Lymphs Abs: 3.4 10*3/uL (ref 0.7–4.0)
MCH: 33.5 pg (ref 26.0–34.0)
MCHC: 35.5 g/dL (ref 30.0–36.0)
MCV: 94.5 fL (ref 78.0–100.0)
Monocytes Absolute: 0.5 10*3/uL (ref 0.1–1.0)
Monocytes Relative: 8 % (ref 3–12)
Neutro Abs: 2.5 10*3/uL (ref 1.7–7.7)
Neutrophils Relative %: 38 % — ABNORMAL LOW (ref 43–77)
Platelets: 231 10*3/uL (ref 150–400)
RBC: 3.97 MIL/uL (ref 3.87–5.11)
RDW: 13 % (ref 11.5–15.5)
WBC: 6.5 10*3/uL (ref 4.0–10.5)

## 2012-12-02 MED ORDER — MEGESTROL ACETATE 20 MG PO TABS: 20.0000 mg | ORAL_TABLET | Freq: Two times a day (BID) | ORAL | Status: DC

## 2012-12-02 NOTE — Patient Instructions (Signed)
Start Megace twice daily. Call if bleeding continues.

## 2012-12-02 NOTE — Progress Notes (Signed)
Patient presents with continued bleeding since her sonohysterogram. It ranges from light to very heavy with bleeding 2 episodes. Had been treated with Megace before the ultrasound and stopped it with the sonohysterogram. Her sonohysterogram was negative except for a small intramural myoma. She did have apparent tufting of the endometrium anteriorly which was thought to be catheter related. No overt obvious polyp or submucous myoma. Her endometrial biopsy showed proliferative endometrium. Her TSH and hemoglobin were normal. Has known elevated FSH in the past.  Exam with Sherrilyn Rist Asst. Abdomen soft nontender without masses guarding rebound organomegaly. External BUS vagina with light menses flow. Cervix normal. Uterus normal size midline mobile nontender. Adnexa without masses or tenderness.  Assessment and plan: Dysfunctional bleeding. Thought to be a aberrant ovulation with subsequent menses. Biopsy negative. Sonohysterogram with small myoma. Cavity grossly normal with the exception of some tufting of the endometrium. Recheck CBC and FSH. Megace 20 mg twice a day #40. If bleeding resolves then will follow. If persists discussed various options to include low-dose oral contraceptives, hysteroscopy D&C make sure that we're not missing a cavitary abnormality up to including hysterectomy.

## 2012-12-03 LAB — FOLLICLE STIMULATING HORMONE: FSH: 61.5 m[IU]/mL

## 2013-03-23 ENCOUNTER — Encounter: Payer: Self-pay | Admitting: Gynecology

## 2013-05-06 ENCOUNTER — Other Ambulatory Visit: Payer: Self-pay

## 2013-05-10 ENCOUNTER — Encounter: Payer: Self-pay | Admitting: Internal Medicine

## 2013-05-10 ENCOUNTER — Ambulatory Visit (INDEPENDENT_AMBULATORY_CARE_PROVIDER_SITE_OTHER): Payer: 59 | Admitting: Internal Medicine

## 2013-05-10 VITALS — BP 110/74 | HR 76 | Temp 99.0°F | Ht 67.0 in | Wt 187.0 lb

## 2013-05-10 DIAGNOSIS — J029 Acute pharyngitis, unspecified: Secondary | ICD-10-CM

## 2013-05-10 DIAGNOSIS — J020 Streptococcal pharyngitis: Secondary | ICD-10-CM

## 2013-05-10 LAB — POCT RAPID STREP A (OFFICE): Rapid Strep A Screen: POSITIVE — AB

## 2013-05-10 MED ORDER — AMOXICILLIN 500 MG PO CAPS: 500.0000 mg | ORAL_CAPSULE | Freq: Three times a day (TID) | ORAL | Status: DC

## 2013-05-10 NOTE — Progress Notes (Signed)
HPI: Pt presents to the office today with complaints of sore throat, ear pain, and fever that started Saturday evening. Pt endorses sore throat, bilateral ear pain, fever, chills, and fatigue. Pt tried OTC Ibuprofen for pain and fever.  Past Medical History  Diagnosis Date  . CIN III (cervical intraepithelial neoplasia III)   . IBS (irritable bowel syndrome)   . Fibroid   . Endometriosis   . Osteopenia 09/2012    T score -2.1 FRAX 4.1%/0.4%    Current Outpatient Prescriptions  Medication Sig Dispense Refill  . Calcium Carbonate-Vitamin D (CALCIUM + D PO) Take by mouth.      . IBUPROFEN PO Take by mouth.      . megestrol (MEGACE) 20 MG tablet Take 1 tablet (20 mg total) by mouth 2 (two) times daily.  40 tablet  0   No current facility-administered medications for this visit.    Allergies  Allergen Reactions  . Sulfonamide Derivatives     Family History  Problem Relation Age of Onset  . Diverticulitis Mother   . Alzheimer's disease Maternal Aunt   . Alzheimer's disease Maternal Grandmother   . Alzheimer's disease Maternal Aunt     History   Social History  . Marital Status: Married    Spouse Name: N/A    Number of Children: N/A  . Years of Education: N/A   Occupational History  . Not on file.   Social History Main Topics  . Smoking status: Never Smoker   . Smokeless tobacco: Never Used  . Alcohol Use: No  . Drug Use: No  . Sexual Activity: Yes    Birth Control/ Protection: None   Other Topics Concern  . Not on file   Social History Narrative  . No narrative on file    ROS:  Constitutional:Endorses fever, chills, malaise, and fatigue.  Denies abrupt weight changes.  HEENT:Endorses ear pain and sore throat.  Denies  ringing in the ears, wax buildup, runny nose, nasal congestion, or bloody nose Respiratory: Denies difficulty breathing, shortness of breath, cough or sputum production.  .  Gastrointestinal: Denies abdominal pain, bloating, nausea, vomiting,  constipation, diarrhea or blood in the stool.    No other specific complaints in a complete review of systems (except as listed in HPI above).  PE:  BP 110/74  Pulse 76  Temp(Src) 99 F (37.2 C) (Oral)  Ht 5\' 7"  (1.702 m)  Wt 187 lb (84.823 kg)  BMI 29.28 kg/m2  LMP 11/02/2012 Wt Readings from Last 3 Encounters:  05/10/13 187 lb (84.823 kg)  06/11/12 182 lb (82.555 kg)  09/02/11 182 lb 6.4 oz (82.736 kg)    General: Appears their stated age, well developed, well nourished in NAD. HEENT: Head: normal shape and size; Eyes: sclera white, no icterus, conjunctiva pink, PERRLA and EOMs intact; Ears: Tm's gray and intact, normal light reflex; Nose: mucosa pink and moist, septum midline; Throat/Mouth: Teeth present, mucosa pink and moist, Bilateral tonsils 1+, with bilateral excudate and erythema.  Neck: Normal range of motion. Neck supple, trachea midline. Bilateral Anterior cervical lymphadenopathy, mobile, >1cm, tender to palpation. Cardiovascular: Normal rate and rhythm. S1,S2 noted.  No murmur, rubs or gallops noted. No JVD or BLE edema. No carotid bruits noted. Pulmonary/Chest: Normal effort and positive vesicular breath sounds. No respiratory distress. No wheezes, rales or ronchi noted.  Psychiatric: Mood and affect normal. Behavior is normal. Judgment and thought content normal.    Assessment and Plan: Strep throat: Positive Rapid strep Prescribed Amoxicillin 500mg   take one tablet three times a day for 10 days; #30, no refills Gargle with warm salt water Follow up in 3-5 days if no improvement or worsening symptoms  \Omayra Tulloch, Jacques Earthly, Student-NP

## 2013-05-10 NOTE — Progress Notes (Signed)
HPI  Pt presents to the clinic today with c/o sore throat, ear pain and fever. This started 2 days ago. She has taken Ibuprofen OTC for pain and fever. She has no history of allergies or asthma. She has had sick contacts.  Review of Systems      Past Medical History  Diagnosis Date  . CIN III (cervical intraepithelial neoplasia III)   . IBS (irritable bowel syndrome)   . Fibroid   . Endometriosis   . Osteopenia 09/2012    T score -2.1 FRAX 4.1%/0.4%    Family History  Problem Relation Age of Onset  . Diverticulitis Mother   . Alzheimer's disease Maternal Aunt   . Alzheimer's disease Maternal Grandmother   . Alzheimer's disease Maternal Aunt     History   Social History  . Marital Status: Married    Spouse Name: N/A    Number of Children: N/A  . Years of Education: N/A   Occupational History  . Not on file.   Social History Main Topics  . Smoking status: Never Smoker   . Smokeless tobacco: Never Used  . Alcohol Use: No  . Drug Use: No  . Sexual Activity: Yes    Birth Control/ Protection: None   Other Topics Concern  . Not on file   Social History Narrative  . No narrative on file    Allergies  Allergen Reactions  . Sulfonamide Derivatives      Constitutional: Positive headache, fatigue and fever. Denies abrupt weight changes.  HEENT:  Positive sore throat. Denies eye redness, eye pain, pressure behind the eyes, facial pain, nasal congestion, ear pain, ringing in the ears, wax buildup, runny nose or bloody nose. Respiratory: Denies cough, difficulty breathing or shortness of breath.  Cardiovascular: Denies chest pain, chest tightness, palpitations or swelling in the hands or feet.   No other specific complaints in a complete review of systems (except as listed in HPI above).  Objective:   BP 110/74  Pulse 76  Temp(Src) 99 F (37.2 C) (Oral)  Ht 5\' 7"  (1.702 m)  Wt 187 lb (84.823 kg)  BMI 29.28 kg/m2  LMP 11/02/2012 Wt Readings from Last 3  Encounters:  05/10/13 187 lb (84.823 kg)  06/11/12 182 lb (82.555 kg)  09/02/11 182 lb 6.4 oz (82.736 kg)     General: Appears her stated age, well developed, well nourished in NAD. HEENT: Head: normal shape and size; Eyes: sclera white, no icterus, conjunctiva pink, PERRLA and EOMs intact; Ears: Tm's gray and intact, normal light reflex; Nose: mucosa pink and moist, septum midline; Throat/Mouth: + PND. Teeth present, mucosa erythematous and moist, white exudate noted on bilateral tonsillar pillars, no lesions or ulcerations noted.  Neck: Mild  tonsillar lymphadenopathy. Neck supple, trachea midline. No massses, lumps or thyromegaly present.  Cardiovascular: Normal rate and rhythm. S1,S2 noted.  No murmur, rubs or gallops noted. No JVD or BLE edema. No carotid bruits noted. Pulmonary/Chest: Normal effort and positive vesicular breath sounds. No respiratory distress. No wheezes, rales or ronchi noted.      Assessment & Plan:   Acute Bacterial Pharyngitis:  RST- positive eRx for Amoxicillin 500 mg TID x 10days Gargle with salt water for comfort.  RTC as needed or if symptoms persist.

## 2013-05-10 NOTE — Patient Instructions (Signed)
Strep Throat  Strep throat is an infection of the throat caused by a bacteria named Streptococcus pyogenes. Your caregiver may call the infection streptococcal "tonsillitis" or "pharyngitis" depending on whether there are signs of inflammation in the tonsils or back of the throat. Strep throat is most common in children aged 49 15 years during the cold months of the year, but it can occur in people of any age during any season. This infection is spread from person to person (contagious) through coughing, sneezing, or other close contact.  SYMPTOMS   · Fever or chills.  · Painful, swollen, red tonsils or throat.  · Pain or difficulty when swallowing.  · White or yellow spots on the tonsils or throat.  · Swollen, tender lymph nodes or "glands" of the neck or under the jaw.  · Red rash all over the body (rare).  DIAGNOSIS   Many different infections can cause the same symptoms. A test must be done to confirm the diagnosis so the right treatment can be given. A "rapid strep test" can help your caregiver make the diagnosis in a few minutes. If this test is not available, a light swab of the infected area can be used for a throat culture test. If a throat culture test is done, results are usually available in a day or two.  TREATMENT   Strep throat is treated with antibiotic medicine.  HOME CARE INSTRUCTIONS   · Gargle with 1 tsp of salt in 1 cup of warm water, 3 4 times per day or as needed for comfort.  · Family members who also have a sore throat or fever should be tested for strep throat and treated with antibiotics if they have the strep infection.  · Make sure everyone in your household washes their hands well.  · Do not share food, drinking cups, or personal items that could cause the infection to spread to others.  · You may need to eat a soft food diet until your sore throat gets better.  · Drink enough water and fluids to keep your urine clear or pale yellow. This will help prevent dehydration.  · Get plenty of  rest.  · Stay home from school, daycare, or work until you have been on antibiotics for 24 hours.  · Only take over-the-counter or prescription medicines for pain, discomfort, or fever as directed by your caregiver.  · If antibiotics are prescribed, take them as directed. Finish them even if you start to feel better.  SEEK MEDICAL CARE IF:   · The glands in your neck continue to enlarge.  · You develop a rash, cough, or earache.  · You cough up green, yellow-brown, or bloody sputum.  · You have pain or discomfort not controlled by medicines.  · Your problems seem to be getting worse rather than better.  SEEK IMMEDIATE MEDICAL CARE IF:   · You develop any new symptoms such as vomiting, severe headache, stiff or painful neck, chest pain, shortness of breath, or trouble swallowing.  · You develop severe throat pain, drooling, or changes in your voice.  · You develop swelling of the neck, or the skin on the neck becomes red and tender.  · You have a fever.  · You develop signs of dehydration, such as fatigue, dry mouth, and decreased urination.  · You become increasingly sleepy, or you cannot wake up completely.  Document Released: 06/14/2000 Document Revised: 06/03/2012 Document Reviewed: 08/16/2010  ExitCare® Patient Information ©2014 [employer].

## 2013-05-10 NOTE — Addendum Note (Signed)
Addended by: Lorre Munroe on: 05/10/2013 11:06 AM   Modules accepted: Orders

## 2013-05-10 NOTE — Progress Notes (Signed)
Pre-visit discussion using our clinic review tool. No additional management support is needed unless otherwise documented below in the visit note.  

## 2013-05-24 ENCOUNTER — Telehealth: Payer: Self-pay | Admitting: Family Medicine

## 2013-05-24 ENCOUNTER — Other Ambulatory Visit: Payer: Self-pay | Admitting: Internal Medicine

## 2013-05-24 MED ORDER — CEFDINIR 300 MG PO CAPS: 300.0000 mg | ORAL_CAPSULE | Freq: Two times a day (BID) | ORAL | Status: DC

## 2013-05-24 MED ORDER — PREDNISONE 10 MG PO TABS: ORAL_TABLET | ORAL | Status: DC

## 2013-05-24 NOTE — Telephone Encounter (Signed)
Patient notified as instructed by telephone. Pt said both of her ears are hurting; pain scale 6-7/ Pts glands feel swollen on sides of neck but pt said not like strep where when she swallows it is like cut glass. Pt leaves tomorrow morning for 4 1/2 hr car trip and visiting older family members and request antibiotic to [facility].Please advise.

## 2013-05-24 NOTE — Telephone Encounter (Signed)
Pt left vm that she was dx with strep throat on 11/10 by Nicki Reaper, NP.  She has finished course of abx but is still having a sore throat and her ears are "stopped up".  She is leaving to go out of town tomorrow and is requesting another round of abx.  Forwarded to PCP and Nicki Reaper, NP.

## 2013-05-24 NOTE — Telephone Encounter (Signed)
Jennifer Velez has left for the day and pt request cb since going out of town 05/25/13 in AM. Pt want to start antibiotic tonight. Pt request cb today at [phone].[place]

## 2013-05-24 NOTE — Telephone Encounter (Signed)
Jennifer Velez notified antibiotic has been sent to [facility].  Dosing instructions given.  Advised if not improving to call and schedule an office visit after she gets back in to town after the holiday.

## 2013-05-24 NOTE — Telephone Encounter (Signed)
omnicef 300 mg, 2 tabs po daily x 10 days, #20

## 2013-05-24 NOTE — Telephone Encounter (Signed)
She shouldn't need another round of abx, lets try some prednisone to see if this will help with inflammation I have called it into her pharmacy

## 2013-05-24 NOTE — Addendum Note (Signed)
Addended by: Damita Lack on: 05/24/2013 04:49 PM   Modules accepted: Orders

## 2013-06-17 ENCOUNTER — Ambulatory Visit (INDEPENDENT_AMBULATORY_CARE_PROVIDER_SITE_OTHER): Payer: 59 | Admitting: Women's Health

## 2013-06-17 ENCOUNTER — Other Ambulatory Visit (HOSPITAL_COMMUNITY)
Admission: RE | Admit: 2013-06-17 | Discharge: 2013-06-17 | Disposition: A | Discharge status: Home / Self Care | Payer: 59 | Source: Ambulatory Visit | Attending: Gynecology | Admitting: Gynecology

## 2013-06-17 ENCOUNTER — Encounter: Payer: Self-pay | Admitting: Women's Health

## 2013-06-17 VITALS — BP 112/72 | Ht 67.5 in | Wt 189.8 lb

## 2013-06-17 DIAGNOSIS — Z13220 Encounter for screening for lipoid disorders: Secondary | ICD-10-CM

## 2013-06-17 DIAGNOSIS — Z01419 Encounter for gynecological examination (general) (routine) without abnormal findings: Secondary | ICD-10-CM | POA: Insufficient documentation

## 2013-06-17 DIAGNOSIS — Z833 Family history of diabetes mellitus: Secondary | ICD-10-CM

## 2013-06-17 LAB — LIPID PANEL
Cholesterol: 177 mg/dL (ref 0–200)
HDL: 38 mg/dL — ABNORMAL LOW (ref >39)
LDL Cholesterol: 107 mg/dL — ABNORMAL HIGH (ref 0–99)
Total CHOL/HDL Ratio: 4.7 Ratio
Triglycerides: 162 mg/dL — ABNORMAL HIGH (ref <150)
VLDL: 32 mg/dL (ref 0–40)

## 2013-06-17 LAB — GLUCOSE, RANDOM: Glucose, Bld: 102 mg/dL — ABNORMAL HIGH (ref 70–99)

## 2013-06-17 LAB — CBC WITH DIFFERENTIAL/PLATELET
Basophils Absolute: 0 10*3/uL (ref 0.0–0.1)
Basophils Relative: 0 % (ref 0–1)
Eosinophils Absolute: 0.1 10*3/uL (ref 0.0–0.7)
Eosinophils Relative: 1 % (ref 0–5)
HCT: 38.5 % (ref 36.0–46.0)
Hemoglobin: 13.7 g/dL (ref 12.0–15.0)
Lymphocytes Relative: 34 % (ref 12–46)
Lymphs Abs: 2.4 10*3/uL (ref 0.7–4.0)
MCH: 32.7 pg (ref 26.0–34.0)
MCHC: 35.6 g/dL (ref 30.0–36.0)
MCV: 91.9 fL (ref 78.0–100.0)
Monocytes Absolute: 0.4 10*3/uL (ref 0.1–1.0)
Monocytes Relative: 6 % (ref 3–12)
Neutro Abs: 4.1 10*3/uL (ref 1.7–7.7)
Neutrophils Relative %: 59 % (ref 43–77)
Platelets: 296 10*3/uL (ref 150–400)
RBC: 4.19 MIL/uL (ref 3.87–5.11)
RDW: 13.2 % (ref 11.5–15.5)
WBC: 7 10*3/uL (ref 4.0–10.5)

## 2013-06-17 NOTE — Patient Instructions (Signed)
Health Recommendations for Postmenopausal Women Respected and ongoing research has looked at the most common causes of death, disability, and poor quality of life in postmenopausal women. The causes include heart disease, diseases of blood vessels, diabetes, depression, cancer, and bone loss (osteoporosis). Many things can be done to help lower the chances of developing these and other common problems: CARDIOVASCULAR DISEASE Heart Disease: A heart attack is a medical emergency. Know the signs and symptoms of a heart attack. Below are things women can do to reduce their risk for heart disease.   Do not smoke. If you smoke, quit.  Aim for a healthy weight. Being overweight causes many preventable deaths. Eat a healthy and balanced diet and drink an adequate amount of liquids.  Get moving. Make a commitment to be more physically active. Aim for 30 minutes of activity on most, if not all days of the week.  Eat for heart health. Choose a diet that is low in saturated fat and cholesterol and eliminate trans fat. Include whole grains, vegetables, and fruits. Read and understand the labels on food containers before buying.  Know your numbers. Ask your caregiver to check your blood pressure, cholesterol (total, HDL, LDL, triglycerides) and blood glucose. Work with your caregiver on improving your entire clinical picture.  High blood pressure. Limit or stop your table salt intake (try salt substitute and food seasonings). Avoid salty foods and drinks. Read labels on food containers before buying. Eating well and exercising can help control high blood pressure. STROKE  Stroke is a medical emergency. Stroke may be the result of a blood clot in a blood vessel in the brain or by a brain hemorrhage (bleeding). Know the signs and symptoms of a stroke. To lower the risk of developing a stroke:  Avoid fatty foods.  Quit smoking.  Control your diabetes, blood pressure, and irregular heart rate. THROMBOPHLEBITIS  (BLOOD CLOT) OF THE LEG  Becoming overweight and leading a stationary lifestyle may also contribute to developing blood clots. Controlling your diet and exercising will help lower the risk of developing blood clots. CANCER SCREENING  Breast Cancer: Take steps to reduce your risk of breast cancer.  You should practice "breast self-awareness." This means understanding the normal appearance and feel of your breasts and should include breast self-examination. Any changes detected, no matter how small, should be reported to your caregiver.  After age 40, you should have a clinical breast exam (CBE) every year.  Starting at age 40, you should consider having a mammogram (breast X-ray) every year.  If you have a family history of breast cancer, talk to your caregiver about genetic screening.  If you are at high risk for breast cancer, talk to your caregiver about having an MRI and a mammogram every year.  Intestinal or Stomach Cancer: Tests to consider are a rectal exam, fecal occult blood, sigmoidoscopy, and colonoscopy. Women who are high risk may need to be screened at an earlier age and more often.  Cervical Cancer:  Beginning at age 30, you should have a Pap test every 3 years as long as the past 3 Pap tests have been normal.  If you have had past treatment for cervical cancer or a condition that could lead to cancer, you need Pap tests and screening for cancer for at least 20 years after your treatment.  If you had a hysterectomy for a problem that was not cancer or a condition that could lead to cancer, then you no longer need Pap tests.    If you are between ages 65 and 70, and you have had normal Pap tests going back 10 years, you no longer need Pap tests.  If Pap tests have been discontinued, risk factors (such as a new sexual partner) need to be reassessed to determine if screening should be resumed.  Some medical problems can increase the chance of getting cervical cancer. In these  cases, your caregiver may recommend more frequent screening and Pap tests.  Uterine Cancer: If you have vaginal bleeding after reaching menopause, you should notify your caregiver.  Ovarian cancer: Other than yearly pelvic exams, there are no reliable tests available to screen for ovarian cancer at this time except for yearly pelvic exams.  Lung Cancer: Yearly chest X-rays can detect lung cancer and should be done on high risk women, such as cigarette smokers and women with chronic lung disease (emphysema).  Skin Cancer: A complete body skin exam should be done at your yearly examination. Avoid overexposure to the sun and ultraviolet light lamps. Use a strong sun block cream when in the sun. All of these things are important in lowering the risk of skin cancer. MENOPAUSE Menopause Symptoms: Hormone therapy products are effective for treating symptoms associated with menopause:  Moderate to severe hot flashes.  Night sweats.  Mood swings.  Headaches.  Tiredness.  Loss of sex drive.  Insomnia.  Other symptoms. Hormone replacement carries certain risks, especially in older women. Women who use or are thinking about using estrogen or estrogen with progestin treatments should discuss that with their caregiver. Your caregiver will help you understand the benefits and risks. The ideal dose of hormone replacement therapy is not known. The Food and Drug Administration (FDA) has concluded that hormone therapy should be used only at the lowest doses and for the shortest amount of time to reach treatment goals.  OSTEOPOROSIS Protecting Against Bone Loss and Preventing Fracture: If you use hormone therapy for prevention of bone loss (osteoporosis), the risks for bone loss must outweigh the risk of the therapy. Ask your caregiver about other medications known to be safe and effective for preventing bone loss and fractures. To guard against bone loss or fractures, the following is recommended:  If  you are less than age 50, take 1000 mg of calcium and at least 600 mg of Vitamin D per day.  If you are greater than age 50 but less than age 70, take 1200 mg of calcium and at least 600 mg of Vitamin D per day.  If you are greater than age 70, take 1200 mg of calcium and at least 800 mg of Vitamin D per day. Smoking and excessive alcohol intake increases the risk of osteoporosis. Eat foods rich in calcium and vitamin D and do weight bearing exercises several times a week as your caregiver suggests. DIABETES Diabetes Melitus: If you have Type I or Type 2 diabetes, you should keep your blood sugar under control with diet, exercise and recommended medication. Avoid too many sweets, starchy and fatty foods. Being overweight can make control more difficult. COGNITION AND MEMORY Cognition and Memory: Menopausal hormone therapy is not recommended for the prevention of cognitive disorders such as Alzheimer's disease or memory loss.  DEPRESSION  Depression may occur at any age, but is common in elderly women. The reasons may be because of physical, medical, social (loneliness), or financial problems and needs. If you are experiencing depression because of medical problems and control of symptoms, talk to your caregiver about this. Physical activity and   exercise may help with mood and sleep. Community and volunteer involvement may help your sense of value and worth. If you have depression and you feel that the problem is getting worse or becoming severe, talk to your caregiver about treatment options that are best for you. ACCIDENTS  Accidents are common and can be serious in the elderly woman. Prepare your house to prevent accidents. Eliminate throw rugs, place hand bars in the bath, shower and toilet areas. Avoid wearing high heeled shoes or walking on wet, snowy, and icy areas. Limit or stop driving if you have vision or hearing problems, or you feel you are unsteady with you movements and  reflexes. HEPATITIS C Hepatitis C is a type of viral infection affecting the liver. It is spread mainly through contact with blood from an infected person. It can be treated, but if left untreated, it can lead to severe liver damage over years. Many people who are infected do not know that the virus is in their blood. If you are a "baby-boomer", it is recommended that you have one screening test for Hepatitis C. IMMUNIZATIONS  Several immunizations are important to consider having during your senior years, including:   Tetanus, diptheria, and pertussis booster shot.  Influenza every year before the flu season begins.  Pneumonia vaccine.  Shingles vaccine.  Others as indicated based on your specific needs. Talk to your caregiver about these. Document Released: 08/09/2005 Document Revised: 06/03/2012 Document Reviewed: 04/04/2008 ExitCare Patient Information 2014 [employer].  

## 2013-06-17 NOTE — Progress Notes (Signed)
Jennifer Velez May 09, 1966 [id number]    History:    The patient presents for annual exam.  Postmenopausal with no bleeding. FSH  59 - 11/2012.  DUB, negative sonohysterogram and negative biopsy 08/2012 bleeding stopped with Megace. CIN-3 1999 LEEP with normal Paps after. 09/2012 Osteopenia T score -2.1, FRAX 4.1%/0.4%.  Normal mammograms history. Negative colonoscopy 2002, history of IBS.  Past medical history, past surgical history, family history and social history were all reviewed and documented in the EPIC chart. Infertility/endometriosis.  Has 2 adopted special needs sons from [place] ages 2, Molli Haz[place] cleft lip and palate and 49 yo, both doing well. Stay-at-home mom, had been in education.  ROS:  A  ROS was performed and pertinent positives and negatives are included in the history.  Exam:  Filed Vitals:   06/17/13 1212  BP: 112/72    General appearance:  Normal Head/Neck:  Normal, without cervical or supraclavicular adenopathy. Thyroid:  Symmetrical, normal in size, without palpable masses or nodularity. Respiratory  Effort:  Normal  Auscultation:  Clear without wheezing or rhonchi Cardiovascular  Auscultation:  Regular rate, without rubs, murmurs or gallops  Edema/varicosities:  Not grossly evident Abdominal  Soft,nontender, without masses, guarding or rebound.  Liver/spleen:  No organomegaly noted  Hernia:  None appreciated  Skin  Inspection:  Grossly normal  Palpation:  Grossly normal Neurologic/psychiatric  Orientation:  Normal with appropriate conversation.  Mood/affect:  Normal  Genitourinary    Breasts: Examined lying and sitting.     Right: Without masses, retractions, discharge or axillary adenopathy.     Left: Without masses, retractions, discharge or axillary adenopathy.   Inguinal/mons:  Normal without inguinal adenopathy  External genitalia:  Normal  BUS/Urethra/Skene's glands:  Normal  Bladder:  Normal  Vagina:  Normal  Cervix:  Normal  Uterus:  normal in  size, shape and contour.  Midline and mobile  Adnexa/parametria:     Rt: Without masses or tenderness.   Lt: Without masses or tenderness.  Anus and perineum: Normal  Digital rectal exam: Normal sphincter tone without palpated masses or tenderness  Assessment/Plan:  47 y.o.MWF G0  for annual exam with no complaints.  Postmenopausal/no bleeding Endometriosis 1999 LEEP with normal Paps after IBS Osteopenia.  Plan: SBE's, continue annual mammogram, calcium rich diet, vitamin D 2000 daily, continue regular exercise. Home safety and fall prevention discussed. Repeat colonoscopy. CBC, glucose, lipid panel, UA, Pap. Instructed to call if any further bleeding.   Harrington Challenger WHNP, 1:18 PM 06/17/2013

## 2013-06-18 LAB — URINALYSIS W MICROSCOPIC + REFLEX CULTURE
Bacteria, UA: NONE SEEN
Bilirubin Urine: NEGATIVE
Casts: NONE SEEN
Crystals: NONE SEEN
Glucose, UA: NEGATIVE mg/dL
Hgb urine dipstick: NEGATIVE
Ketones, ur: NEGATIVE mg/dL
Leukocytes, UA: NEGATIVE
Nitrite: NEGATIVE
Protein, ur: NEGATIVE mg/dL
Specific Gravity, Urine: 1.021 (ref 1.005–1.030)
Squamous Epithelial / LPF: NONE SEEN
Urobilinogen, UA: 0.2 mg/dL (ref 0.0–1.0)
pH: 7 (ref 5.0–8.0)

## 2013-08-10 ENCOUNTER — Telehealth: Payer: Self-pay | Admitting: Family Medicine

## 2013-08-10 NOTE — Telephone Encounter (Signed)
Patient Information:  Caller Name: Mallie Mussel  Phone: [phone]  Patient: Jennifer Velez, Jennifer Velez  Gender: Female  DOB: 10-28-1965  Age: 48 Years  PCP: Owens Loffler (Family Practice)  Pregnant: No  Office Follow Up:  Does the office need to follow up with this patient?: Yes  Instructions For The Office: would like an appt today if one opens up; can be at the office in 78mins; wants to know if there is anything she can start on today  RN Note:  appt scheduled 2/11 at [place]; doesn't want to use [facility] or another [facility] location; advised against taking Prednisone and Cefdinir left over from last illness, een though sxs similar  Symptoms  Reason For Call & Symptoms: c/o cold/cough that started 08/07/13; had a fever Sunday, but not today; c/o chest and head congestion; sore in chest; hoarse; coughing up some yellowy green phlegm; nasal congestion is the same; sore throat; slight chills  Reviewed Health History In EMR: Yes  Reviewed Medications In EMR: Yes  Reviewed Allergies In EMR: Yes  Reviewed Surgeries / Procedures: Yes  Date of Onset of Symptoms: 08/07/2013  Treatments Tried: Motrin, Nyquil  Treatments Tried Worked: Yes OB / GYN:  LMP: Unknown  Guideline(s) Used:  Cough  Disposition Per Guideline:   Go to Office Now  Reason For Disposition Reached:   Wheezing is present  Advice Given:  Coughing Spasms:  Drink warm fluids. Inhale warm mist (Reason: both relax the airway and loosen up the phlegm).  Suck on cough drops or hard candy to coat the irritated throat.  Prevent Dehydration:  Drink adequate liquids.  This will help soothe an irritated or dry throat and loosen up the phlegm.  Avoid Tobacco Smoke:  Smoking or being exposed to smoke makes coughs much worse.  Call Back If:  You become worse.  Caution - Dextromethorphan:   Do not try to completely suppress coughs that produce mucus and phlegm. Remember that coughing is helpful in bringing up mucus from the lungs and preventing  pneumonia.  OTC Cough Syrup - Dextromethorphan:  Cough syrups containing the cough suppressant dextromethorphan (DM) may help decrease your cough. Cough syrups work best for coughs that keep you awake at night. They can also sometimes help in the late stages of a respiratory infection when the cough is dry and hacking. They can be used along with cough drops.  RN Overrode Recommendation:  Patient Already Has Appt, Document Patient  2/11 at [phone]

## 2013-08-11 ENCOUNTER — Encounter: Payer: Self-pay | Admitting: Internal Medicine

## 2013-08-11 ENCOUNTER — Ambulatory Visit (INDEPENDENT_AMBULATORY_CARE_PROVIDER_SITE_OTHER): Payer: 59 | Admitting: Internal Medicine

## 2013-08-11 VITALS — BP 106/64 | HR 81 | Temp 98.4°F | Wt 189.8 lb

## 2013-08-11 DIAGNOSIS — J019 Acute sinusitis, unspecified: Secondary | ICD-10-CM | POA: Insufficient documentation

## 2013-08-11 MED ORDER — AMOXICILLIN 500 MG PO TABS: 1000.0000 mg | ORAL_TABLET | Freq: Two times a day (BID) | ORAL | Status: DC

## 2013-08-11 NOTE — Progress Notes (Signed)
   Subjective:    Patient ID: Jennifer Velez, female    DOB: February 14, 1966, 48 y.o.   MRN: [id number]  HPI Has been sick for about 4-5 days Started as aching, fever and head congestion Better 2 days ago after nyquil/ibuprofen Worsened yesterday with congestion in chest and head Terrible chest cough last night Cough produces "yucky" mucus Yellow green nasal discharge  No fever No obvious PND Bad maxillary pressure Ears feel pressure also Tried allergy pill also--no help Some SOB-- chest feels full  Current Outpatient Prescriptions on File Prior to Visit  Medication Sig Dispense Refill  . Calcium Carbonate-Vitamin D (CALCIUM + D PO) Take by mouth.      . IBUPROFEN PO Take by mouth.       No current facility-administered medications on file prior to visit.    Allergies  Allergen Reactions  . Sulfonamide Derivatives     Past Medical History  Diagnosis Date  . CIN III (cervical intraepithelial neoplasia III)   . IBS (irritable bowel syndrome)   . Fibroid   . Endometriosis   . Osteopenia 09/2012    T score -2.1 FRAX 4.1%/0.4%    Past Surgical History  Procedure Laterality Date  . Pelvic laparoscopy    . Cervical biopsy  w/ loop electrode excision  1999  . Exploratory laparotomy  2004    myomectomy and excision endometriosis  . Colposcopy      Family History  Problem Relation Age of Onset  . Diverticulitis Mother   . Alzheimer's disease Maternal Aunt   . Alzheimer's disease Maternal Grandmother   . Alzheimer's disease Maternal Aunt     History   Social History  . Marital Status: Married    Spouse Name: N/A    Number of Children: N/A  . Years of Education: N/A   Occupational History  . Not on file.   Social History Main Topics  . Smoking status: Never Smoker   . Smokeless tobacco: Never Used  . Alcohol Use: No  . Drug Use: No  . Sexual Activity: Yes    Birth Control/ Protection: None   Other Topics Concern  . Not on file   Social History Narrative    . No narrative on file    Review of Systems No rash No vomiting or diarrhea 19 year old son with some cough now    Objective:   Physical Exam  Constitutional: She appears well-developed and well-nourished. No distress.  HENT:  Moderate maxillary tenderness TMs normal Mild nasal inflammation  Neck: Normal range of motion. Neck supple.  Mildly tender anterior cervical nodes  Pulmonary/Chest: Effort normal and breath sounds normal. No respiratory distress. She has no wheezes. She has no rales.          Assessment & Plan:

## 2013-08-11 NOTE — Assessment & Plan Note (Signed)
Seems to have secondary bacterial infection Discussed supportive care amoxil

## 2013-08-11 NOTE — Patient Instructions (Signed)

## 2013-08-11 NOTE — Progress Notes (Signed)
Pre-visit discussion using our clinic review tool. No additional management support is needed unless otherwise documented below in the visit note.  

## 2013-08-13 ENCOUNTER — Telehealth: Payer: Self-pay

## 2013-08-13 MED ORDER — AMOXICILLIN-POT CLAVULANATE 875-125 MG PO TABS: 1.0000 | ORAL_TABLET | Freq: Two times a day (BID) | ORAL | Status: DC

## 2013-08-13 NOTE — Telephone Encounter (Signed)
Pt left v/m was seen 08/11/13 and started taking amoxicillin on 08/11/13; pt still has swelling in face,severe head congestion, eyes are red,pt does not feel any better but pt does not have copay to be rechecked. Pt has prescription of prednisone that pt did not take and pt wanted to know if prednisone would help with swelling in face.Please advise.

## 2013-08-13 NOTE — Telephone Encounter (Signed)
Spoke with patient and advised results rx sent to pharmacy by e-script  

## 2013-08-13 NOTE — Telephone Encounter (Signed)
It might help but it also could be that the amoxicillin isn't strong enough Okay to send Rx for augmentin 875 #20 x 0 1 bid with food  To take if still not better in the next 1-2 days

## 2013-12-20 ENCOUNTER — Ambulatory Visit (INDEPENDENT_AMBULATORY_CARE_PROVIDER_SITE_OTHER): Payer: 59 | Admitting: Internal Medicine

## 2013-12-20 ENCOUNTER — Encounter: Payer: Self-pay | Admitting: Internal Medicine

## 2013-12-20 VITALS — BP 112/64 | HR 94 | Temp 98.3°F | Wt 179.0 lb

## 2013-12-20 DIAGNOSIS — B309 Viral conjunctivitis, unspecified: Secondary | ICD-10-CM

## 2013-12-20 DIAGNOSIS — J209 Acute bronchitis, unspecified: Secondary | ICD-10-CM

## 2013-12-20 DIAGNOSIS — B9689 Other specified bacterial agents as the cause of diseases classified elsewhere: Secondary | ICD-10-CM

## 2013-12-20 DIAGNOSIS — J019 Acute sinusitis, unspecified: Secondary | ICD-10-CM

## 2013-12-20 MED ORDER — AMOXICILLIN-POT CLAVULANATE 875-125 MG PO TABS: 1.0000 | ORAL_TABLET | Freq: Two times a day (BID) | ORAL | Status: DC

## 2013-12-20 NOTE — Progress Notes (Signed)
HPI  Pt presents to the clinic today with c/o cough, chest congestion, shortness of breath, fever, chills, left eye pain and redness. She reports this started 1 week ago. She has run fevers up to 102. The cough is productive of thick green mucous. She is also blowing green mucous out of her nose. Her left eye has been itching and red. She also noticed that it was crusted shut. She has tried Mucinex, Ibuprofen and Tylenol with some relief. She has no history of seasonal allergies or breathing problems that she is aware of. She has had sick contacts.  Review of Systems      Past Medical History  Diagnosis Date  . CIN III (cervical intraepithelial neoplasia III)   . IBS (irritable bowel syndrome)   . Fibroid   . Endometriosis   . Osteopenia 09/2012    T score -2.1 FRAX 4.1%/0.4%    Family History  Problem Relation Age of Onset  . Diverticulitis Mother   . Alzheimer's disease Maternal Aunt   . Alzheimer's disease Maternal Grandmother   . Alzheimer's disease Maternal Aunt     History   Social History  . Marital Status: Married    Spouse Name: N/A    Number of Children: N/A  . Years of Education: N/A   Occupational History  . Not on file.   Social History Main Topics  . Smoking status: Never Smoker   . Smokeless tobacco: Never Used  . Alcohol Use: No  . Drug Use: No  . Sexual Activity: Yes    Birth Control/ Protection: None   Other Topics Concern  . Not on file   Social History Narrative  . No narrative on file    Allergies  Allergen Reactions  . Sulfonamide Derivatives      Constitutional: Positive headache, fatigue and fever. Denies abrupt weight changes.  HEENT:  Positive eye pain, eye redness, nasal congestion and sore throat. Denies pressure behind the eyes, facial pain, ear pain, ringing in the ears, wax buildup, runny nose or bloody nose. Respiratory: Positive cough and shortness of breath. Denies difficulty breathing.  Cardiovascular: Denies chest pain,  chest tightness, palpitations or swelling in the hands or feet.   No other specific complaints in a complete review of systems (except as listed in HPI above).  Objective:   BP 112/64  Pulse 94  Temp(Src) 98.3 F (36.8 C) (Oral)  Wt 179 lb (81.194 kg)  LMP 11/02/2012 Wt Readings from Last 3 Encounters:  12/20/13 179 lb (81.194 kg)  08/11/13 189 lb 12 oz (86.07 kg)  06/17/13 189 lb 12.8 oz (86.093 kg)     General: Appears her stated age, well developed, well nourished in NAD. HEENT: Head: normal shape and size, maxillary sinus tenderness; Left Eyes: sclera injected, no icterus, conjunctiva erythematous, PERRLA and EOMs intact; Ears: Tm's gray and intact, normal light reflex; Nose: mucosa pink and moist, septum midline; Throat/Mouth: + PND. Teeth present, mucosa pink and moist, no exudate noted, no lesions or ulcerations noted.  Neck: Neck supple, trachea midline. No massses, lumps or thyromegaly present.  Cardiovascular: Normal rate and rhythm. S1,S2 noted.  No murmur, rubs or gallops noted. No JVD or BLE edema. No carotid bruits noted. Pulmonary/Chest: Normal effort and coars breath sounds. No respiratory distress. No wheezes, rales noted.      Assessment & Plan:   Acute Sinusitis/Bronchitis with viral conjunctivitis:  Get some rest and drink plenty of water Do salt water gargles for the sore throat eRx for  Augmentin BID x 10 days Delsym OTC for cough Ibuprofen for eye inflammation and fever Warm compresses to left eye TID  RTC as needed or if symptoms persist.

## 2013-12-20 NOTE — Patient Instructions (Addendum)
Sinusitis Sinusitis is redness, soreness, and swelling (inflammation) of the paranasal sinuses. Paranasal sinuses are air pockets within the bones of your face (beneath the eyes, the middle of the forehead, or above the eyes). In healthy paranasal sinuses, mucus is able to drain out, and air is able to circulate through them by way of your nose. However, when your paranasal sinuses are inflamed, mucus and air can become trapped. This can allow bacteria and other germs to grow and cause infection. Sinusitis can develop quickly and last only a short time (acute) or continue over a long period (chronic). Sinusitis that lasts for more than 12 weeks is considered chronic.  CAUSES  Causes of sinusitis include:  Allergies.  Structural abnormalities, such as displacement of the cartilage that separates your nostrils (deviated septum), which can decrease the air flow through your nose and sinuses and affect sinus drainage.  Functional abnormalities, such as when the small hairs (cilia) that line your sinuses and help remove mucus do not work properly or are not present. SYMPTOMS  Symptoms of acute and chronic sinusitis are the same. The primary symptoms are pain and pressure around the affected sinuses. Other symptoms include:  Upper toothache.  Earache.  Headache.  Bad breath.  Decreased sense of smell and taste.  A cough, which worsens when you are lying flat.  Fatigue.  Fever.  Thick drainage from your nose, which often is green and may contain pus (purulent).  Swelling and warmth over the affected sinuses. DIAGNOSIS  Your caregiver will perform a physical exam. During the exam, your caregiver may:  Look in your nose for signs of abnormal growths in your nostrils (nasal polyps).  Tap over the affected sinus to check for signs of infection.  View the inside of your sinuses (endoscopy) with a special imaging device with a light attached (endoscope), which is inserted into your  sinuses. If your caregiver suspects that you have chronic sinusitis, one or more of the following tests may be recommended:  Allergy tests.  Nasal culture--A sample of mucus is taken from your nose and sent to a lab and screened for bacteria.  Nasal cytology--A sample of mucus is taken from your nose and examined by your caregiver to determine if your sinusitis is related to an allergy. TREATMENT  Most cases of acute sinusitis are related to a viral infection and will resolve on their own within 10 days. Sometimes medicines are prescribed to help relieve symptoms (pain medicine, decongestants, nasal steroid sprays, or saline sprays).  However, for sinusitis related to a bacterial infection, your caregiver will prescribe antibiotic medicines. These are medicines that will help kill the bacteria causing the infection.  Rarely, sinusitis is caused by a fungal infection. In theses cases, your caregiver will prescribe antifungal medicine. For some cases of chronic sinusitis, surgery is needed. Generally, these are cases in which sinusitis recurs more than 3 times per year, despite other treatments. HOME CARE INSTRUCTIONS   Drink plenty of water. Water helps thin the mucus so your sinuses can drain more easily.  Use a humidifier.  Inhale steam 3 to 4 times a day (for example, sit in the bathroom with the shower running).  Apply a warm, moist washcloth to your face 3 to 4 times a day, or as directed by your caregiver.  Use saline nasal sprays to help moisten and clean your sinuses.  Take over-the-counter or prescription medicines for pain, discomfort, or fever only as directed by your caregiver. SEEK IMMEDIATE MEDICAL CARE IF:    You have increasing pain or severe headaches.  You have nausea, vomiting, or drowsiness.  You have swelling around your face.  You have vision problems.  You have a stiff neck.  You have difficulty breathing. MAKE SURE YOU:   Understand these  instructions.  Will watch your condition.  Will get help right away if you are not doing well or get worse. Document Released: 06/17/2005 Document Revised: 09/09/2011 Document Reviewed: 07/02/2011 Exit[employer] Patient Information 2015 [employer]. This information is not intended to replace advice given to you by your health care provider. Make sure you discuss any questions you have with your health care provider.  

## 2013-12-20 NOTE — Progress Notes (Signed)
Pre visit review using our clinic review tool, if applicable. No additional management support is needed unless otherwise documented below in the visit note. 

## 2014-01-06 ENCOUNTER — Telehealth: Payer: Self-pay | Admitting: Family Medicine

## 2014-01-06 ENCOUNTER — Other Ambulatory Visit: Payer: Self-pay | Admitting: Internal Medicine

## 2014-01-06 MED ORDER — AZITHROMYCIN 250 MG PO TABS: ORAL_TABLET | ORAL | Status: DC

## 2014-01-06 NOTE — Telephone Encounter (Signed)
Pt is aware as instructed 

## 2014-01-06 NOTE — Telephone Encounter (Signed)
Pt called back to see if there was a response from her initial call. Informed her of what you said about the zyrtec and flonase. Pt states that it is in her chest and coughing up green phlegm that means there is still infection. Pt really would like another antibiotic. She cannot come back in due to her son just having surgery. Please advise.

## 2014-01-06 NOTE — Telephone Encounter (Signed)
The antibiotic should have taken care of it. She should try some zyrtec and flonase for the next few days. If no improvement, she will need to be reevaluated on Monday.

## 2014-01-06 NOTE — Telephone Encounter (Signed)
Pt called, completed her antibiotic on 12/30/13, felt better briefly, however pt is now coughing up green phlegm and eyes are puffy.  Pt would like to get an additional antibiotic called in if possible.  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 Best number to call pt is [phone] Pharmacy of choice is [facility].

## 2014-01-06 NOTE — Telephone Encounter (Signed)
I have called in a zpack. If no improvement, she will need to follow up.

## 2014-03-31 ENCOUNTER — Other Ambulatory Visit: Payer: Self-pay

## 2014-03-31 DIAGNOSIS — Z1239 Encounter for other screening for malignant neoplasm of breast: Secondary | ICD-10-CM

## 2014-04-04 ENCOUNTER — Ambulatory Visit: Payer: 59

## 2014-04-06 ENCOUNTER — Ambulatory Visit
Admission: RE | Admit: 2014-04-06 | Discharge: 2014-04-06 | Disposition: A | Discharge status: Home / Self Care | Payer: 59 | Source: Ambulatory Visit

## 2014-04-06 DIAGNOSIS — Z1239 Encounter for other screening for malignant neoplasm of breast: Secondary | ICD-10-CM

## 2014-05-02 ENCOUNTER — Encounter: Payer: Self-pay | Admitting: Internal Medicine

## 2014-06-20 ENCOUNTER — Encounter: Payer: Self-pay | Admitting: Women's Health

## 2014-06-20 ENCOUNTER — Ambulatory Visit (INDEPENDENT_AMBULATORY_CARE_PROVIDER_SITE_OTHER): Payer: 59 | Admitting: Women's Health

## 2014-06-20 VITALS — BP 110/70 | Ht 67.5 in | Wt 185.8 lb

## 2014-06-20 DIAGNOSIS — Z01419 Encounter for gynecological examination (general) (routine) without abnormal findings: Secondary | ICD-10-CM

## 2014-06-20 DIAGNOSIS — Z13220 Encounter for screening for lipoid disorders: Secondary | ICD-10-CM

## 2014-06-20 DIAGNOSIS — Z780 Asymptomatic menopausal state: Secondary | ICD-10-CM

## 2014-06-20 LAB — URINALYSIS W MICROSCOPIC + REFLEX CULTURE
Bacteria, UA: NONE SEEN
Bilirubin Urine: NEGATIVE
Casts: NONE SEEN
Glucose, UA: NEGATIVE mg/dL
Hgb urine dipstick: NEGATIVE
Ketones, ur: NEGATIVE mg/dL
Nitrite: NEGATIVE
Protein, ur: NEGATIVE mg/dL
Specific Gravity, Urine: 1.025 (ref 1.005–1.030)
Urobilinogen, UA: 0.2 mg/dL (ref 0.0–1.0)
pH: 5.5 (ref 5.0–8.0)

## 2014-06-20 LAB — LIPID PANEL
Cholesterol: 182 mg/dL (ref 0–200)
HDL: 43 mg/dL (ref >39)
LDL Cholesterol: 122 mg/dL — ABNORMAL HIGH (ref 0–99)
Total CHOL/HDL Ratio: 4.2 Ratio
Triglycerides: 84 mg/dL (ref <150)
VLDL: 17 mg/dL (ref 0–40)

## 2014-06-20 LAB — CBC WITH DIFFERENTIAL/PLATELET
Basophils Absolute: 0 10*3/uL (ref 0.0–0.1)
Basophils Relative: 0 % (ref 0–1)
Eosinophils Absolute: 0.1 10*3/uL (ref 0.0–0.7)
Eosinophils Relative: 2 % (ref 0–5)
HCT: 41.5 % (ref 36.0–46.0)
Hemoglobin: 14.1 g/dL (ref 12.0–15.0)
Lymphocytes Relative: 41 % (ref 12–46)
Lymphs Abs: 2.3 10*3/uL (ref 0.7–4.0)
MCH: 32.6 pg (ref 26.0–34.0)
MCHC: 34 g/dL (ref 30.0–36.0)
MCV: 96.1 fL (ref 78.0–100.0)
MPV: 8.9 fL — ABNORMAL LOW (ref 9.4–12.4)
Monocytes Absolute: 0.3 10*3/uL (ref 0.1–1.0)
Monocytes Relative: 6 % (ref 3–12)
Neutro Abs: 2.8 10*3/uL (ref 1.7–7.7)
Neutrophils Relative %: 51 % (ref 43–77)
Platelets: 266 10*3/uL (ref 150–400)
RBC: 4.32 MIL/uL (ref 3.87–5.11)
RDW: 12.9 % (ref 11.5–15.5)
WBC: 5.5 10*3/uL (ref 4.0–10.5)

## 2014-06-20 LAB — COMPREHENSIVE METABOLIC PANEL
ALT: 13 U/L (ref 0–35)
AST: 14 U/L (ref 0–37)
Albumin: 4.2 g/dL (ref 3.5–5.2)
Alkaline Phosphatase: 76 U/L (ref 39–117)
BUN: 13 mg/dL (ref 6–23)
CO2: 30 mEq/L (ref 19–32)
Calcium: 9.4 mg/dL (ref 8.4–10.5)
Chloride: 102 mEq/L (ref 96–112)
Creat: 0.74 mg/dL (ref 0.50–1.10)
Glucose, Bld: 93 mg/dL (ref 70–99)
Potassium: 3.9 mEq/L (ref 3.5–5.3)
Sodium: 139 mEq/L (ref 135–145)
Total Bilirubin: 0.4 mg/dL (ref 0.2–1.2)
Total Protein: 6.8 g/dL (ref 6.0–8.3)

## 2014-06-20 NOTE — Progress Notes (Signed)
Jennifer Velez 08-30-1965 [id number]    History:    Presents for annual exam.  Postmenopausal/no bleeding/no HRT. 1999 LEEP for CIN-3 with normal Paps after. History of infertility/endometriosis/fibroid -  has 2 adopted sons from [place]. Normal mammogram history. 2014 osteopenia T score -2.1 FRAX 4.1%/0.4%.   Past medical history, past surgical history, family history and social history were all reviewed and documented in the EPIC chart. Teacher, doing some substituting. 80-year-old mat as cleft lip and palate, 33-year-old son doing well. Mother questionable Alzheimer's.  ROS:  A ROS was performed and pertinent positives and negatives are included.  Exam:  Filed Vitals:   06/20/14 0925  BP: 110/70    General appearance:  Normal Thyroid:  Symmetrical, normal in size, without palpable masses or nodularity. Respiratory  Auscultation:  Clear without wheezing or rhonchi Cardiovascular  Auscultation:  Regular rate, without rubs, murmurs or gallops  Edema/varicosities:  Not grossly evident Abdominal  Soft,nontender, without masses, guarding or rebound.  Liver/spleen:  No organomegaly noted  Hernia:  None appreciated  Skin  Inspection:  Grossly normal   Breasts: Examined lying and sitting.     Right: Without masses, retractions, discharge or axillary adenopathy.     Left: Without masses, retractions, discharge or axillary adenopathy. Gentitourinary   Inguinal/mons:  Normal without inguinal adenopathy  External genitalia:  Normal  BUS/Urethra/Skene's glands:  Normal  Vagina:  Normal  Cervix:  Normal  Uterus:   normal in size, shape and contour.  Midline and mobile  Adnexa/parametria:     Rt: Without masses or tenderness.   Lt: Without masses or tenderness.  Anus and perineum: Normal  Digital rectal exam: Normal sphincter tone without palpated masses or tenderness  Assessment/Plan:  48 y.o. MWF G0 +2 adopted sons for annual exam.   Postmenopausal/no HRT/no bleeding 1999 LEEP for  CIN-3 with normal Paps after Osteopenia without elevated FRAX IBS  Plan: SBE's, continue annual screening mammogram, calcium rich diet, vitamin D 2000 daily encouraged. Increase regular exercise, home safety, fall prevention discussed. Pap normal 2014, new screening guidelines reviewed. CBC, lipid panel, CMP, vitamin D, UA. Screening colonoscopy, [facility] information given and reviewed. Repeat DEXA next year. Complained of minimal left ankle swelling, Motrin, flat shoes, orthopedist if problem persists.   Huel Cote WHNP, 1:27 PM 06/20/2014

## 2014-06-20 NOTE — Patient Instructions (Signed)
Health Recommendations for Postmenopausal Women Respected and ongoing research has looked at the most common causes of death, disability, and poor quality of life in postmenopausal women. The causes include heart disease, diseases of blood vessels, diabetes, depression, cancer, and bone loss (osteoporosis). Many things can be done to help lower the chances of developing these and other common problems. CARDIOVASCULAR DISEASE Heart Disease: A heart attack is a medical emergency. Know the signs and symptoms of a heart attack. Below are things women can do to reduce their risk for heart disease.   Do not smoke. If you smoke, quit.  Aim for a healthy weight. Being overweight causes many preventable deaths. Eat a healthy and balanced diet and drink an adequate amount of liquids.  Get moving. Make a commitment to be more physically active. Aim for 30 minutes of activity on most, if not all days of the week.  Eat for heart health. Choose a diet that is low in saturated fat and cholesterol and eliminate trans fat. Include whole grains, vegetables, and fruits. Read and understand the labels on food containers before buying.  Know your numbers. Ask your caregiver to check your blood pressure, cholesterol (total, HDL, LDL, triglycerides) and blood glucose. Work with your caregiver on improving your entire clinical picture.  High blood pressure. Limit or stop your table salt intake (try salt substitute and food seasonings). Avoid salty foods and drinks. Read labels on food containers before buying. Eating well and exercising can help control high blood pressure. STROKE  Stroke is a medical emergency. Stroke may be the result of a blood clot in a blood vessel in the brain or by a brain hemorrhage (bleeding). Know the signs and symptoms of a stroke. To lower the risk of developing a stroke:  Avoid fatty foods.  Quit smoking.  Control your diabetes, blood pressure, and irregular heart rate. THROMBOPHLEBITIS  (BLOOD CLOT) OF THE LEG  Becoming overweight and leading a stationary lifestyle may also contribute to developing blood clots. Controlling your diet and exercising will help lower the risk of developing blood clots. CANCER SCREENING  Breast Cancer: Take steps to reduce your risk of breast cancer.  You should practice "breast self-awareness." This means understanding the normal appearance and feel of your breasts and should include breast self-examination. Any changes detected, no matter how small, should be reported to your caregiver.  After age 40, you should have a clinical breast exam (CBE) every year.  Starting at age 40, you should consider having a mammogram (breast X-ray) every year.  If you have a family history of breast cancer, talk to your caregiver about genetic screening.  If you are at high risk for breast cancer, talk to your caregiver about having an MRI and a mammogram every year.  Intestinal or Stomach Cancer: Tests to consider are a rectal exam, fecal occult blood, sigmoidoscopy, and colonoscopy. Women who are high risk may need to be screened at an earlier age and more often.  Cervical Cancer:  Beginning at age 30, you should have a Pap test every 3 years as long as the past 3 Pap tests have been normal.  If you have had past treatment for cervical cancer or a condition that could lead to cancer, you need Pap tests and screening for cancer for at least 20 years after your treatment.  If you had a hysterectomy for a problem that was not cancer or a condition that could lead to cancer, then you no longer need Pap tests.    If you are between ages 65 and 70, and you have had normal Pap tests going back 10 years, you no longer need Pap tests.  If Pap tests have been discontinued, risk factors (such as a new sexual partner) need to be reassessed to determine if screening should be resumed.  Some medical problems can increase the chance of getting cervical cancer. In these  cases, your caregiver may recommend more frequent screening and Pap tests.  Uterine Cancer: If you have vaginal bleeding after reaching menopause, you should notify your caregiver.  Ovarian Cancer: Other than yearly pelvic exams, there are no reliable tests available to screen for ovarian cancer at this time except for yearly pelvic exams.  Lung Cancer: Yearly chest X-rays can detect lung cancer and should be done on high risk women, such as cigarette smokers and women with chronic lung disease (emphysema).  Skin Cancer: A complete body skin exam should be done at your yearly examination. Avoid overexposure to the sun and ultraviolet light lamps. Use a strong sun block cream when in the sun. All of these things are important for lowering the risk of skin cancer. MENOPAUSE Menopause Symptoms: Hormone therapy products are effective for treating symptoms associated with menopause:  Moderate to severe hot flashes.  Night sweats.  Mood swings.  Headaches.  Tiredness.  Loss of sex drive.  Insomnia.  Other symptoms. Hormone replacement carries certain risks, especially in older women. Women who use or are thinking about using estrogen or estrogen with progestin treatments should discuss that with their caregiver. Your caregiver will help you understand the benefits and risks. The ideal dose of hormone replacement therapy is not known. The Food and Drug Administration (FDA) has concluded that hormone therapy should be used only at the lowest doses and for the shortest amount of time to reach treatment goals.  OSTEOPOROSIS Protecting Against Bone Loss and Preventing Fracture If you use hormone therapy for prevention of bone loss (osteoporosis), the risks for bone loss must outweigh the risk of the therapy. Ask your caregiver about other medications known to be safe and effective for preventing bone loss and fractures. To guard against bone loss or fractures, the following is recommended:  If  you are younger than age 50, take 1000 mg of calcium and at least 600 mg of Vitamin D per day.  If you are older than age 50 but younger than age 70, take 1200 mg of calcium and at least 600 mg of Vitamin D per day.  If you are older than age 70, take 1200 mg of calcium and at least 800 mg of Vitamin D per day. Smoking and excessive alcohol intake increases the risk of osteoporosis. Eat foods rich in calcium and vitamin D and do weight bearing exercises several times a week as your caregiver suggests. DIABETES Diabetes Mellitus: If you have type I or type 2 diabetes, you should keep your blood sugar under control with diet, exercise, and recommended medication. Avoid starchy and fatty foods, and too many sweets. Being overweight can make diabetes control more difficult. COGNITION AND MEMORY Cognition and Memory: Menopausal hormone therapy is not recommended for the prevention of cognitive disorders such as Alzheimer's disease or memory loss.  DEPRESSION  Depression may occur at any age, but it is common in elderly women. This may be because of physical, medical, social (loneliness), or financial problems and needs. If you are experiencing depression because of medical problems and control of symptoms, talk to your caregiver about this. Physical   activity and exercise may help with mood and sleep. Community and volunteer involvement may improve your sense of value and worth. If you have depression and you feel that the problem is getting worse or becoming severe, talk to your caregiver about which treatment options are best for you. ACCIDENTS  Accidents are common and can be serious in elderly woman. Prepare your house to prevent accidents. Eliminate throw rugs, place hand bars in bath, shower, and toilet areas. Avoid wearing high heeled shoes or walking on wet, snowy, and icy areas. Limit or stop driving if you have vision or hearing problems, or if you feel you are unsteady with your movements and  reflexes. HEPATITIS C Hepatitis C is a type of viral infection affecting the liver. It is spread mainly through contact with blood from an infected person. It can be treated, but if left untreated, it can lead to severe liver damage over the years. Many people who are infected do not know that the virus is in their blood. If you are a "baby-boomer", it is recommended that you have one screening test for Hepatitis C. IMMUNIZATIONS  Several immunizations are important to consider having during your senior years, including:   Tetanus, diphtheria, and pertussis booster shot.  Influenza every year before the flu season begins.  Pneumonia vaccine.  Shingles vaccine.  Others, as indicated based on your specific needs. Talk to your caregiver about these. Document Released: 08/09/2005 Document Revised: 11/01/2013 Document Reviewed: 04/04/2008 Exit[employer] Patient Information 2015 [employer]. This information is not intended to replace advice given to you by your health care provider. Make sure you discuss any questions you have with your health care provider.  

## 2014-06-21 LAB — VITAMIN D 25 HYDROXY (VIT D DEFICIENCY, FRACTURES): Vit D, 25-Hydroxy: 39 ng/mL (ref 30–100)

## 2014-06-21 LAB — URINE CULTURE
Colony Count: NO GROWTH
Organism ID, Bacteria: NO GROWTH

## 2014-07-27 ENCOUNTER — Encounter: Payer: Self-pay | Admitting: Gastroenterology

## 2014-10-06 ENCOUNTER — Ambulatory Visit (AMBULATORY_SURGERY_CENTER): Payer: Self-pay | Admitting: *Deleted

## 2014-10-06 VITALS — Ht 67.0 in | Wt 184.4 lb

## 2014-10-06 DIAGNOSIS — Z1211 Encounter for screening for malignant neoplasm of colon: Secondary | ICD-10-CM

## 2014-10-06 MED ORDER — MOVIPREP 100 G PO SOLR
ORAL | Status: DC
Start: 2014-10-06 — End: 2014-10-19

## 2014-10-06 NOTE — Progress Notes (Signed)
No egg or soy allergy  No anesthesia or intubation problems per pt  No diet medications taken   

## 2014-10-07 ENCOUNTER — Encounter: Payer: Self-pay | Admitting: Gastroenterology

## 2014-10-19 ENCOUNTER — Ambulatory Visit: Payer: 59 | Admitting: Gastroenterology

## 2014-10-19 ENCOUNTER — Encounter: Payer: Self-pay | Admitting: Gastroenterology

## 2014-10-19 VITALS — BP 115/58 | HR 50 | Temp 97.7°F | Resp 10 | Ht 67.0 in | Wt 184.0 lb

## 2014-10-19 DIAGNOSIS — D124 Benign neoplasm of descending colon: Secondary | ICD-10-CM | POA: Diagnosis not present

## 2014-10-19 DIAGNOSIS — Z1211 Encounter for screening for malignant neoplasm of colon: Secondary | ICD-10-CM | POA: Diagnosis not present

## 2014-10-19 MED ORDER — SODIUM CHLORIDE 0.9 % IV SOLN: 500.0000 mL | INTRAVENOUS | Status: DC

## 2014-10-19 NOTE — Patient Instructions (Signed)
Discharge instructions given. Handouts on polyps and diverticulosis. Resume previous medications. YOU HAD AN ENDOSCOPIC PROCEDURE TODAY AT THE [facility]:   Refer to the procedure report that was given to you for any specific questions about what was found during the examination.  If the procedure report does not answer your questions, please call your gastroenterologist to clarify.  If you requested that your care partner not be given the details of your procedure findings, then the procedure report has been included in a sealed envelope for you to review at your convenience later.  YOU SHOULD EXPECT: Some feelings of bloating in the abdomen. Passage of more gas than usual.  Walking can help get rid of the air that was put into your GI tract during the procedure and reduce the bloating. If you had a lower endoscopy (such as a colonoscopy or flexible sigmoidoscopy) you may notice spotting of blood in your stool or on the toilet paper. If you underwent a bowel prep for your procedure, you may not have a normal bowel movement for a few days.  Please Note:  You might notice some irritation and congestion in your nose or some drainage.  This is from the oxygen used during your procedure.  There is no need for concern and it should clear up in a day or so.  SYMPTOMS TO REPORT IMMEDIATELY:   Following lower endoscopy (colonoscopy or flexible sigmoidoscopy):  Excessive amounts of blood in the stool  Significant tenderness or worsening of abdominal pains  Swelling of the abdomen that is new, acute  Fever of 100F or higher   For urgent or emergent issues, a gastroenterologist can be reached at any hour by calling [phone].   DIET: Your first meal following the procedure should be a small meal and then it is ok to progress to your normal diet. Heavy or fried foods are harder to digest and may make you feel nauseous or bloated.  Likewise, meals heavy in dairy and vegetables can  increase bloating.  Drink plenty of fluids but you should avoid alcoholic beverages for 24 hours.  ACTIVITY:  You should plan to take it easy for the rest of today and you should NOT DRIVE or use heavy machinery until tomorrow (because of the sedation medicines used during the test).    FOLLOW UP: Our staff will call the number listed on your records the next business day following your procedure to check on you and address any questions or concerns that you may have regarding the information given to you following your procedure. If we do not reach you, we will leave a message.  However, if you are feeling well and you are not experiencing any problems, there is no need to return our call.  We will assume that you have returned to your regular daily activities without incident.  If any biopsies were taken you will be contacted by phone or by letter within the next 1-3 weeks.  Please call us at [phone] if you have not heard about the biopsies in 3 weeks.    SIGNATURES/CONFIDENTIALITY: You and/or your care partner have signed paperwork which will be entered into your electronic medical record.  These signatures attest to the fact that that the information above on your After Visit Summary has been reviewed and is understood.  Full responsibility of the confidentiality of this discharge information lies with you and/or your care-partner. 

## 2014-10-19 NOTE — Progress Notes (Signed)
Pt awake and alert, pleased with MAC, Report to RN

## 2014-10-19 NOTE — Op Note (Signed)
[place]  [address] [place], [id number]   COLONOSCOPY PROCEDURE REPORT  PATIENT: Jennifer Velez, Jennifer Velez  MR#: [id number] BIRTHDATE: Apr 03, 1966 , 48  yrs. old GENDER: female ENDOSCOPIST: Milus Banister, MD REFERRED [id number] Celedonio Savage, M.D. PROCEDURE DATE:  10/19/2014 PROCEDURE:   Colonoscopy with snare polypectomy First Screening Colonoscopy - Avg.  risk and is 50 yrs.  old or older Yes.  Prior Negative Screening - Now for repeat screening. N/A  History of Adenoma - Now for follow-up colonoscopy & has been > or = to 3 yrs.  N/A ASA CLASS:   Class II INDICATIONS:Screening for colonic neoplasia and Colorectal Neoplasm Risk Assessment for this procedure is average risk. MEDICATIONS: Propofol 350 mg IV and Monitored anesthesia care  DESCRIPTION OF PROCEDURE:   After the risks benefits and alternatives of the procedure were thoroughly explained, informed consent was obtained.  The digital rectal exam revealed no abnormalities of the rectum.   The [id number]  endoscope was introduced through the anus and advanced to the cecum, which was identified by both the appendix and ileocecal valve. No adverse events experienced.   The quality of the prep was excellent.  The instrument was then slowly withdrawn as the colon was fully examined.  COLON FINDINGS: Two sessile polyps ranging between 3-34mm in size were found in the descending colon.  Polypectomies were performed with a cold snare.  The resection was complete, the polyp tissue was completely retrieved and sent to histology.   There was mild diverticulosis noted throughout the entire examined colon.   The examination was otherwise normal.  Retroflexed views revealed no abnormalities. The time to cecum = 2.6 Withdrawal time = 11.0   The scope was withdrawn and the procedure completed. COMPLICATIONS: There were no immediate complications.  ENDOSCOPIC IMPRESSION: 1.   Two polyps were found, removed and sent to  pathology 2.   Mild diverticulosis was noted throughout the entire examined colon 3.   The examination was otherwise normal  RECOMMENDATIONS: 1. If the polyp(s) removed today are proven to be adenomatous (pre-cancerous) polyps, you will need a repeat colonoscopy in 5 years.  Otherwise you should continue to follow colorectal cancer screening guidelines for "routine risk" patients with colonoscopy in 10 years.  You will receive a letter within 1-2 weeks with the results of your biopsy as well as final recommendations.  Please call my office if you have not received a letter after 3 weeks. 2. You mentioned that you had several gastointestinal issues and so my office will get in touch with you to schedule an office visit to discuss further.  eSigned:  Milus Banister, MD 10/19/2014 11:09 AM

## 2014-10-19 NOTE — Progress Notes (Signed)
Called to room to assist during endoscopic procedure.  Patient ID and intended procedure confirmed with present staff. Received instructions for my participation in the procedure from the performing physician.  

## 2014-10-20 ENCOUNTER — Telehealth: Payer: Self-pay | Admitting: *Deleted

## 2014-10-20 NOTE — Telephone Encounter (Signed)
  Follow up Call-  Call back number 10/19/2014  Post procedure Call Back phone  # [phone]  Permission to leave phone message Yes     Patient questions:  Do you have a fever, pain , or abdominal swelling? No. Pain Score  0 *  Have you tolerated food without any problems? Yes.    Have you been able to return to your normal activities? Yes.    Do you have any questions about your discharge instructions: Diet   No. Medications  No. Follow up visit  No.  Do you have questions or concerns about your Care? No.  Actions: * If pain score is 4 or above: No action needed, pain <4.

## 2014-10-21 ENCOUNTER — Telehealth: Payer: Self-pay

## 2014-10-21 NOTE — Telephone Encounter (Signed)
Pt has been scheduled for ROV per Dr Ardis Hughs and has been given the appt date and time of 12/20/14

## 2014-10-21 NOTE — Telephone Encounter (Signed)
Left message on machine to call back  

## 2014-10-21 NOTE — Telephone Encounter (Signed)
You mentioned that you had several gastointestinal issues and so my office will get in touch with you to schedule an office visit to discuss further.

## 2014-10-25 ENCOUNTER — Encounter: Payer: Self-pay | Admitting: Gastroenterology

## 2014-12-20 ENCOUNTER — Encounter: Payer: Self-pay | Admitting: Gastroenterology

## 2014-12-20 ENCOUNTER — Telehealth: Payer: Self-pay

## 2014-12-20 ENCOUNTER — Ambulatory Visit (INDEPENDENT_AMBULATORY_CARE_PROVIDER_SITE_OTHER): Payer: 59 | Admitting: Gastroenterology

## 2014-12-20 VITALS — BP 94/60 | HR 76 | Ht 67.0 in | Wt 186.2 lb

## 2014-12-20 DIAGNOSIS — K6289 Other specified diseases of anus and rectum: Secondary | ICD-10-CM

## 2014-12-20 MED ORDER — DILTIAZEM GEL 2 %: 1.0000 "application " | Freq: Two times a day (BID) | CUTANEOUS | Status: DC

## 2014-12-20 NOTE — Telephone Encounter (Signed)
Error

## 2014-12-20 NOTE — Patient Instructions (Signed)
Please start taking citrucel (orange flavored) powder fiber supplement.  This may cause some bloating at first but that usually goes away. Begin with a small spoonful and work your way up to a large, heaping spoonful daily over a week. Diltiazem gel, apply twice daily for 6 weeks, 2.0%.  Sitz baths twice before the ointment. Call the office in 6-7 weeks to report on your response.

## 2014-12-20 NOTE — Progress Notes (Signed)
Review of pertinent gastrointestinal problems: 1. Adenomatous polyp; screening colonoscopy 09/2014 Dr. Ardis Hughs, 2 sub CM polyps, one was adenomatous.  Recall recommended at 5 year interval. 2. Pan-diverticulosis noted on 2016 colonoscopy.  HPI: This is a  very pleasant 49 year old woman whom I last saw the time of a screening colonoscopy. At the time of that visit she brought up multiple GI issues and we arranged for this new patient office visit   Chief complaint is anal discomfort  She has anal discomfort, especially with BM. Feels that not everything is coming out.  Having a BM can hurt.  Can have itching. She uses wipes.  She feels like she always has stool left inside, a feeling of incomplete evacuation and some fecal soilage of her underpants not on a daily basis but periodically.    Review of systems: Pertinent positive and negative review of systems were noted in the above HPI section. Complete review of systems was performed and was otherwise normal.   Past Medical History  Diagnosis Date  . CIN III (cervical intraepithelial neoplasia III)   . IBS (irritable bowel syndrome)   . Fibroid   . Endometriosis   . Osteopenia 09/2012    T score -2.1 FRAX 4.1%/0.4%    Past Surgical History  Procedure Laterality Date  . Pelvic laparoscopy      x2  . Cervical biopsy  w/ loop electrode excision  1999  . Exploratory laparotomy  2004    myomectomy and excision endometriosis  . Colposcopy      Current Outpatient Prescriptions  Medication Sig Dispense Refill  . IBUPROFEN PO Take by mouth as needed.     Marland Kitchen VITAMIN D, ERGOCALCIFEROL, PO Take 1 tablet by mouth as needed.     No current facility-administered medications for this visit.    Allergies as of 12/20/2014 - Review Complete 12/20/2014  Allergen Reaction Noted  . Sulfonamide derivatives  12/19/2008    Family History  Problem Relation Age of Onset  . Diverticulitis Mother   . Alzheimer's disease Maternal Aunt   .  Alzheimer's disease Maternal Grandmother   . Alzheimer's disease Maternal Aunt   . Colon cancer Neg Hx   . Esophageal cancer Neg Hx   . Rectal cancer Neg Hx   . Stomach cancer Neg Hx   . Ulcerative colitis Maternal Aunt   . Ulcerative colitis Cousin     History   Social History  . Marital Status: Married    Spouse Name: N/A  . Number of Children: N/A  . Years of Education: N/A   Occupational History  . Not on file.   Social History Main Topics  . Smoking status: Never Smoker   . Smokeless tobacco: Never Used  . Alcohol Use: No  . Drug Use: No  . Sexual Activity: Yes    Birth Control/ Protection: None   Other Topics Concern  . Not on file   Social History Narrative     Physical Exam: BP 94/60 mmHg  Pulse 76  Ht 5\' 7"  (1.702 m)  Wt 186 lb 4 oz (84.482 kg)  BMI 29.16 kg/m2  LMP 11/02/2012 Constitutional: generally well-appearing Psychiatric: alert and oriented x3 Eyes: extraocular movements intact Mouth: oral pharynx moist, no lesions Neck: supple no lymphadenopathy Cardiovascular: heart regular rate and rhythm Lungs: clear to auscultation bilaterally Abdomen: soft, nontender, nondistended, no obvious ascites, no peritoneal signs, normal bowel sounds Extremities: no lower extremity edema bilaterally Skin: no lesions on visible extremities Rectal examination with female assistant  in the room: No external anal hemorrhoids, no obvious anal fissures, anal exam was normal, distal rectal examination was normal without masses or tenderness. Stool was not checked for heme occult given her recent colonoscopy  Assessment and plan: 49 y.o. female with  anal discomfort, fecal soilage  Clinically I suspect she has a small anal fissure just cannot be seen by examination at this point. Recommending she bulk up her stools with fiber supplements and she will apply diltiazem ointment 2% gel twice daily with sitz bath just prior. She will call to report on her response to this  therapy in 6-7 weeks.   Owens Loffler, MD [facility] 12/20/2014, 2:53 PM  Cc: Owens Loffler, MD

## 2014-12-20 NOTE — Telephone Encounter (Signed)
Pt called and states she cannot afford the thirty six dollar charge for the diltiazem gel. Is there another option for this patient?

## 2014-12-22 NOTE — Telephone Encounter (Signed)
Pt aware and will try the prep H and call back next week with if she has no relief

## 2014-12-22 NOTE — Telephone Encounter (Signed)
i don't think any other fissure meds will be less expensive.   She can continue OTC ointments, such as preparation H.  Apply one dab twice daily.  That may be more expensive as well however.

## 2014-12-22 NOTE — Telephone Encounter (Signed)
Left message on machine to call back  

## 2015-03-20 ENCOUNTER — Other Ambulatory Visit: Payer: Self-pay

## 2015-03-20 DIAGNOSIS — Z1231 Encounter for screening mammogram for malignant neoplasm of breast: Secondary | ICD-10-CM

## 2015-04-11 ENCOUNTER — Ambulatory Visit
Admission: RE | Admit: 2015-04-11 | Discharge: 2015-04-11 | Disposition: A | Discharge status: Home / Self Care | Payer: Commercial Managed Care - HMO | Source: Ambulatory Visit

## 2015-04-11 DIAGNOSIS — Z1231 Encounter for screening mammogram for malignant neoplasm of breast: Secondary | ICD-10-CM

## 2015-04-24 ENCOUNTER — Telehealth: Payer: Self-pay | Admitting: *Deleted

## 2015-04-24 DIAGNOSIS — Z01419 Encounter for gynecological examination (general) (routine) without abnormal findings: Secondary | ICD-10-CM

## 2015-04-24 NOTE — Telephone Encounter (Signed)
Okay to have labs checked prior to office visit, the only other labs needed would be a lipid panel, ask if she can come fasting and then all labs could be done on October 27. She has 2 adopted children from [place] ages 36 and 74.

## 2015-04-24 NOTE — Telephone Encounter (Signed)
Pt is scheduled for annual on 05/02/15 in process adopting a child overseas she will need the follow labs done. HBS Antigen, HIV, U/A, CBC. Pt said she will come on 04/27/15 to have this blood work done, and will have her regular lab done day of annual. Okay to place orders?

## 2015-04-25 NOTE — Telephone Encounter (Signed)
Pt said no she will have lipid profile on 05/02/15

## 2015-04-25 NOTE — Telephone Encounter (Signed)
Left on voicemail to call if she would like add the lipid profile to the below, pt asked for lab appointment on 04/27/15 @ 4:00pm for the below.

## 2015-04-27 ENCOUNTER — Other Ambulatory Visit: Payer: Commercial Managed Care - HMO

## 2015-04-27 DIAGNOSIS — Z01419 Encounter for gynecological examination (general) (routine) without abnormal findings: Secondary | ICD-10-CM

## 2015-04-27 LAB — CBC
HCT: 38.3 % (ref 36.0–46.0)
Hemoglobin: 13.1 g/dL (ref 12.0–15.0)
MCH: 32.8 pg (ref 26.0–34.0)
MCHC: 34.2 g/dL (ref 30.0–36.0)
MCV: 95.8 fL (ref 78.0–100.0)
MPV: 8.9 fL (ref 8.6–12.4)
Platelets: 259 10*3/uL (ref 150–400)
RBC: 4 MIL/uL (ref 3.87–5.11)
RDW: 12.9 % (ref 11.5–15.5)
WBC: 6.9 10*3/uL (ref 4.0–10.5)

## 2015-04-27 LAB — HIV ANTIBODY (ROUTINE TESTING W REFLEX): HIV 1&2 Ab, 4th Generation: NONREACTIVE

## 2015-04-28 LAB — URINALYSIS W MICROSCOPIC + REFLEX CULTURE
Bilirubin Urine: NEGATIVE
Casts: NONE SEEN [LPF]
Crystals: NONE SEEN [HPF]
Glucose, UA: NEGATIVE
Hgb urine dipstick: NEGATIVE
Ketones, ur: NEGATIVE
Leukocytes, UA: NEGATIVE
Nitrite: NEGATIVE
Protein, ur: NEGATIVE
RBC / HPF: NONE SEEN RBC/HPF (ref <=2)
Specific Gravity, Urine: 1.026 (ref 1.001–1.035)
WBC, UA: NONE SEEN WBC/HPF (ref <=5)
Yeast: NONE SEEN [HPF]
pH: 6.5 (ref 5.0–8.0)

## 2015-04-28 LAB — HEPATITIS B SURFACE ANTIGEN: Hepatitis B Surface Ag: NEGATIVE

## 2015-05-01 LAB — URINE CULTURE: Colony Count: 75000

## 2015-05-02 ENCOUNTER — Encounter: Payer: Self-pay | Admitting: Women's Health

## 2015-05-02 ENCOUNTER — Other Ambulatory Visit (HOSPITAL_COMMUNITY)
Admission: RE | Admit: 2015-05-02 | Discharge: 2015-05-02 | Disposition: A | Discharge status: Home / Self Care | Payer: Commercial Managed Care - HMO | Source: Ambulatory Visit | Attending: Gynecology | Admitting: Gynecology

## 2015-05-02 ENCOUNTER — Ambulatory Visit (INDEPENDENT_AMBULATORY_CARE_PROVIDER_SITE_OTHER): Payer: Commercial Managed Care - HMO | Admitting: Women's Health

## 2015-05-02 VITALS — BP 122/80 | Ht 67.0 in | Wt 192.0 lb

## 2015-05-02 DIAGNOSIS — M899 Disorder of bone, unspecified: Secondary | ICD-10-CM | POA: Diagnosis not present

## 2015-05-02 DIAGNOSIS — M810 Age-related osteoporosis without current pathological fracture: Secondary | ICD-10-CM

## 2015-05-02 DIAGNOSIS — M8580 Other specified disorders of bone density and structure, unspecified site: Secondary | ICD-10-CM

## 2015-05-02 DIAGNOSIS — Z01419 Encounter for gynecological examination (general) (routine) without abnormal findings: Secondary | ICD-10-CM

## 2015-05-02 LAB — LIPID PANEL
Cholesterol: 186 mg/dL (ref 125–200)
HDL: 35 mg/dL — ABNORMAL LOW (ref >=46)
LDL Cholesterol: 120 mg/dL (ref <130)
Total CHOL/HDL Ratio: 5.3 Ratio — ABNORMAL HIGH (ref <=5.0)
Triglycerides: 155 mg/dL — ABNORMAL HIGH (ref <150)
VLDL: 31 mg/dL — ABNORMAL HIGH (ref <30)

## 2015-05-02 NOTE — Patient Instructions (Signed)

## 2015-05-02 NOTE — Progress Notes (Signed)
Jennifer Velez [birth year] [id number]    History:    Presents for annual exam.  Postmenopausal/no HRT/no bleeding. 1999 LEEP for CIN-3 with normal Paps after. History of infertility and endometriosis. Normal mammogram history. 2014 T score -2.1 FRAX 4.1%/0.4%. 2016 benign colon polyp. 2004 myomectomy.   Past medical history, past surgical history, family history and social history were all reviewed and documented in the EPIC chart. 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 Has 2 sons ages 25 and 73 adopted from [place] 72-year-old has cleft lip and palate that has had surgical repair, adopting a 11-year-old girl with Down's from [place] this year.   ROS:  A ROS was performed and pertinent positives and negatives are included.  Exam:  Filed Vitals:   05/02/15 1007  BP: 122/80    General appearance:  Normal Thyroid:  Symmetrical, normal in size, without palpable masses or nodularity. Respiratory  Auscultation:  Clear without wheezing or rhonchi Cardiovascular  Auscultation:  Regular rate, without rubs, murmurs or gallops  Edema/varicosities:  Not grossly evident Abdominal  Soft,nontender, without masses, guarding or rebound.  Liver/spleen:  No organomegaly noted  Hernia:  None appreciated  Skin  Inspection:  Grossly normal   Breasts: Examined lying and sitting.     Right: Without masses, retractions, discharge or axillary adenopathy.     Left: Without masses, retractions, discharge or axillary adenopathy. Gentitourinary   Inguinal/mons:  Normal without inguinal adenopathy  External genitalia:  Normal  BUS/Urethra/Skene's glands:  Normal  Vagina:  Normal  Cervix:  Normal  Uterus:   normal in size, shape and contour.  Midline and mobile  Adnexa/parametria:     Rt: Without masses or tenderness.   Lt: Without masses or tenderness.  Anus and perineum: Normal  Digital rectal exam: Normal sphincter tone without palpated masses or tenderness  Assessment/Plan:  49 y.o. MWF G1 P0 +2 adopted  (process of adopting third) for annual exam with no complaints.  Postmenopausal/no HRT/no bleeding 1999 LEEP for CIN-3 with normal Paps after History of infertility/endometriosis Osteopenia without elevated FRAX  Plan: UA, Pap with HR HPV typing, lipid screen, had normal labs of CBC, TSH, last week. Physical form filled out for adoption process. SBE's, continue annual screening mammogram, reviewed breast tissue dense best to get 3-D tomography. Continue active lifestyle, exercise, decrease carbs for weight loss encouraged. DEXA will schedule, home safety, fall prevention and importance of weightbearing exercise reviewed.  [place], 1:05 PM 05/02/2015

## 2015-05-04 LAB — CYTOLOGY - PAP

## 2015-06-01 ENCOUNTER — Ambulatory Visit (INDEPENDENT_AMBULATORY_CARE_PROVIDER_SITE_OTHER): Payer: Commercial Managed Care - HMO

## 2015-06-01 ENCOUNTER — Other Ambulatory Visit: Payer: Self-pay | Admitting: Women's Health

## 2015-06-01 DIAGNOSIS — M810 Age-related osteoporosis without current pathological fracture: Secondary | ICD-10-CM

## 2015-06-01 DIAGNOSIS — M8580 Other specified disorders of bone density and structure, unspecified site: Secondary | ICD-10-CM

## 2015-06-01 HISTORY — DX: Age-related osteoporosis without current pathological fracture: M81.0

## 2015-06-05 ENCOUNTER — Telehealth: Payer: Self-pay | Admitting: Gynecology

## 2015-06-05 ENCOUNTER — Encounter: Payer: Self-pay | Admitting: Gynecology

## 2015-06-05 NOTE — Telephone Encounter (Signed)
Tell patient that her bone density shows osteoporosis with loss from her prior study. Recommend baseline TSH, PTH, comprehensive metabolic panel, vitamin D and 24-hour urine collection for calcium excretion office visit after the above lab work returns to discuss treatment options

## 2015-06-05 NOTE — Telephone Encounter (Signed)
Left message for pt to call.

## 2015-06-15 ENCOUNTER — Other Ambulatory Visit: Payer: Self-pay | Admitting: Gynecology

## 2015-06-15 DIAGNOSIS — M810 Age-related osteoporosis without current pathological fracture: Secondary | ICD-10-CM

## 2015-06-15 NOTE — Telephone Encounter (Signed)
Left message for pt to call.

## 2015-06-15 NOTE — Telephone Encounter (Signed)
Patient called in voice mail. I called her back. Line busy x 3.

## 2015-06-15 NOTE — Telephone Encounter (Signed)
Patient informed. Orders placed and lab appt and visit scheduled.

## 2015-06-16 ENCOUNTER — Other Ambulatory Visit: Payer: Self-pay | Admitting: Gynecology

## 2015-06-16 DIAGNOSIS — M810 Age-related osteoporosis without current pathological fracture: Secondary | ICD-10-CM

## 2015-06-27 ENCOUNTER — Other Ambulatory Visit: Payer: Commercial Managed Care - HMO

## 2015-06-27 DIAGNOSIS — M810 Age-related osteoporosis without current pathological fracture: Secondary | ICD-10-CM

## 2015-06-28 LAB — COMPREHENSIVE METABOLIC PANEL
ALT: 15 U/L (ref 6–29)
AST: 14 U/L (ref 10–35)
Albumin: 3.9 g/dL (ref 3.6–5.1)
Alkaline Phosphatase: 68 U/L (ref 33–130)
BUN: 17 mg/dL (ref 7–25)
CO2: 30 mmol/L (ref 20–31)
Calcium: 9.3 mg/dL (ref 8.6–10.4)
Chloride: 103 mmol/L (ref 98–110)
Creat: 0.68 mg/dL (ref 0.50–1.05)
Glucose, Bld: 104 mg/dL — ABNORMAL HIGH (ref 65–99)
Potassium: 3.9 mmol/L (ref 3.5–5.3)
Sodium: 139 mmol/L (ref 135–146)
Total Bilirubin: 0.4 mg/dL (ref 0.2–1.2)
Total Protein: 6.6 g/dL (ref 6.1–8.1)

## 2015-06-28 LAB — PARATHYROID HORMONE, INTACT (NO CA): PTH: 20 pg/mL (ref 14–64)

## 2015-06-28 LAB — TSH: TSH: 1.391 u[IU]/mL (ref 0.350–4.500)

## 2015-06-28 LAB — VITAMIN D 25 HYDROXY (VIT D DEFICIENCY, FRACTURES): Vit D, 25-Hydroxy: 32 ng/mL (ref 30–100)

## 2015-06-29 ENCOUNTER — Other Ambulatory Visit: Payer: Commercial Managed Care - HMO

## 2015-06-29 DIAGNOSIS — M810 Age-related osteoporosis without current pathological fracture: Secondary | ICD-10-CM

## 2015-06-29 LAB — CALCIUM, URINE, 24 HOUR
Calcium, 24 hour urine: 204 mg/(24.h) (ref 35–250)
Calcium, Ur: 12 mg/dL

## 2015-07-04 ENCOUNTER — Ambulatory Visit (INDEPENDENT_AMBULATORY_CARE_PROVIDER_SITE_OTHER): Payer: Commercial Managed Care - HMO | Admitting: Gynecology

## 2015-07-04 ENCOUNTER — Telehealth: Payer: Self-pay | Admitting: *Deleted

## 2015-07-04 ENCOUNTER — Encounter: Payer: Self-pay | Admitting: Gynecology

## 2015-07-04 VITALS — BP 122/78

## 2015-07-04 DIAGNOSIS — M810 Age-related osteoporosis without current pathological fracture: Secondary | ICD-10-CM

## 2015-07-04 DIAGNOSIS — J0120 Acute ethmoidal sinusitis, unspecified: Secondary | ICD-10-CM | POA: Diagnosis not present

## 2015-07-04 MED ORDER — ALENDRONATE SODIUM 70 MG PO TABS: 70.0000 mg | ORAL_TABLET | ORAL | Status: DC

## 2015-07-04 MED ORDER — AZITHROMYCIN 250 MG PO TABS: ORAL_TABLET | ORAL | Status: DC

## 2015-07-04 NOTE — Telephone Encounter (Signed)
PLEASE NOTE: All timestamps contained within this report are represented as [place] Standard Time. CONFIDENTIALTY NOTICE: This fax transmission is intended only for the addressee. It contains information that is legally privileged, confidential or otherwise protected from use or disclosure. If you are not the intended recipient, you are strictly prohibited from reviewing, disclosing, copying using or disseminating any of this information or taking any action in reliance on or regarding this information. If you have received this fax in error, please notify us immediately by telephone so that we can arrange for its return to [place]. Phone: [phone], Toll-Free: [phone], Fax: [phone] Page: 1 of 2 Call Id: [id number] [facility] Patient Name: Jennifer Velez Gender: Female DOB: 1966-04-07 Age: 50 Y 15 D Return Phone Number: [phone] (Primary), [phone] (Secondary) Address: City/State/Zip: [place] Night - Client Client Site [facility] [place], [place] Contact Type Call Call Type Triage / Clinical Relationship To Patient Self Return Phone Number [phone] (Primary) Chief Complaint Runny or [name] states she has had severe sinus congestion. Has tried several OTC meds and they have not helped. Would like something called in. PreDisposition Home Care Nurse Assessment Nurse: Cox, RN, [place] Date/Time (Eastern Time): 07/02/2015 8:09:27 AM Confirm and document reason for call. If symptomatic, describe symptoms. ---Caller states she has sinus pain and congestion for one week, no fever. had sinus infection last June, feels like she did then Has the patient traveled out of the country within the last 30 days? ---No Does the patient have any new or worsening symptoms? ---Yes Will a  triage be completed? ---Yes Related visit to physician within the last 2 weeks? ---No Does the PT have any chronic conditions? (i.e. diabetes, asthma, etc.) ---No Did the patient indicate they were pregnant? ---No Is this a behavioral health or substance abuse call? ---No Guidelines Guideline Title Affirmed Question Affirmed Notes Nurse Date/Time Eilene Ghazi Time) Sinus Pain or Congestion [1] Redness or swelling on the cheek, forehead or around the eye AND [2] no fever Cox, RN, [facility] [id number] AM Disp. Time Eilene Ghazi Time) Disposition Final User 07/02/2015 8:15:47 AM See Physician within 4 Hours (or PCP triage) Yes Cox, RN, [facility] Caller Understands: Yes PLEASE NOTE: All timestamps contained within this report are represented as [place] Standard Time. CONFIDENTIALTY NOTICE: This fax transmission is intended only for the addressee. It contains information that is legally privileged, confidential or otherwise protected from use or disclosure. If you are not the intended recipient, you are strictly prohibited from reviewing, disclosing, copying using or disseminating any of this information or taking any action in reliance on or regarding this information. If you have received this fax in error, please notify us immediately by telephone so that we can arrange for its return to [place]. Phone: [phone], Toll-Free: [phone], Fax: [phone] Page: 2 of 2 Call Id: [id number] Disagree/Comply: Disagree Disagree/Comply Reason: Disagree with instructions Care Advice Given Per Guideline SEE PHYSICIAN WITHIN 4 HOURS (or PCP triage): * IF OFFICE WILL BE CLOSED AND NO PCP TRIAGE: You need to be seen within the next 3 or 4 hours. A nearby Urgent Care Center is often a good source of care. Another choice is to go to the ER. Go sooner if you become worse. PAIN MEDICINES: * For pain relief, take acetaminophen, ibuprofen, or naproxen. CALL BACK IF: * You become worse. CARE ADVICE given per Sinus  Pain or Congestion (Adult) guideline. After Care Instructions Given Call Event Type User Date / Time Description Referrals GO TO FACILITY REFUSED

## 2015-07-04 NOTE — Progress Notes (Signed)
Jennifer Velez 03-25-1966 [id number]        50 y.o.  G1P0010 Presents with 2 issues:  1. Recent DEXA showing osteoporosis -2.8 AP spine with 9% loss from prior 2014 bone density. Recent workup for secondary causes shows a normal serum calcium, comprehensive metabolic panel, TSH, PTH and 24 hour urine collection for calcium excretion. 2. 2-3 weeks of URI with coughing sore throat and periorbital sinus pain. Periorbital sinus pain seems to be worsening. No fevers or chills. No frank sputum.  Past medical history,surgical history, problem list, medications, allergies, family history and social history were all reviewed and documented in the EPIC chart.  Directed ROS with pertinent positives and negatives documented in the history of present illness/assessment and plan.  Exam: Filed Vitals:   07/04/15 1433  BP: 122/78   General appearance:  Normal HEENT normal without evidence of pharyngitis or cervical lymphadenopathy. Reported periorbital discomfort to palpation bilaterally Lungs clear bilaterally Cardiac regular rate no rubs murmurs or gallops  Assessment/Plan:  50 y.o. G1P0010 with:  1. URI with sinusitis.  Will cover with a Z-Pak. Continue with OTC decongestants and cough suppressants that she is using now. Follow up if symptoms persist, worsen or recur. 2. Osteoporosis. Workup for secondary causes negative. Did go through menopause proximally 5 years ago which I think has contributed to her bone loss. No strong family history of osteoporosis.  Discussed options to include observation versus treatment. Treatment options reviewed to include bisphosphonates, Evista, Prolia, Forteo.  I reviewed the risks/benefits of each choice to include GERD, osteonecrosis of the jaw atypical fractures particularly with prolonged use. After lengthy discussion she was to go ahead and try alendronate 70 mg weekly. How to take the medication was discussed and call precautions reviewed. Patient will go ahead and  start this and then we'll see how she does. Will plan repeat DEXA at two-year interval.    Anastasio Auerbach MD, 3:02 PM 07/04/2015

## 2015-07-04 NOTE — Patient Instructions (Signed)
Start on the alendronate one pill weekly as we discussed. Call if any issues with this.  Add an extra 1000 units of vitamin D to whatever you're taking now. You should be getting a total of 1200-1500 mg calcium total dietary per day.  Start the Z-Pak antibiotics for the sinusitis. Take 2 pills today and then 1 pill daily for the next 4 days.

## 2015-08-07 ENCOUNTER — Encounter: Payer: Self-pay | Admitting: Internal Medicine

## 2015-08-07 ENCOUNTER — Ambulatory Visit (INDEPENDENT_AMBULATORY_CARE_PROVIDER_SITE_OTHER): Payer: Commercial Managed Care - HMO | Admitting: Internal Medicine

## 2015-08-07 VITALS — BP 114/70 | HR 83 | Temp 98.3°F | Wt 194.0 lb

## 2015-08-07 DIAGNOSIS — J309 Allergic rhinitis, unspecified: Secondary | ICD-10-CM | POA: Diagnosis not present

## 2015-08-07 MED ORDER — PREDNISONE 10 MG PO TABS: ORAL_TABLET | ORAL | Status: DC

## 2015-08-07 NOTE — Progress Notes (Signed)
Pre visit review using our clinic review tool, if applicable. No additional management support is needed unless otherwise documented below in the visit note. 

## 2015-08-07 NOTE — Patient Instructions (Signed)
Allergic Rhinitis Allergic rhinitis is when the mucous membranes in the nose respond to allergens. Allergens are particles in the air that cause your body to have an allergic reaction. This causes you to release allergic antibodies. Through a chain of events, these eventually cause you to release histamine into the blood stream. Although meant to protect the body, it is this release of histamine that causes your discomfort, such as frequent sneezing, congestion, and an itchy, runny nose.  CAUSES Seasonal allergic rhinitis (hay fever) is caused by pollen allergens that may come from grasses, trees, and weeds. Year-round allergic rhinitis (perennial allergic rhinitis) is caused by allergens such as house dust mites, pet dander, and mold spores. SYMPTOMS  Nasal stuffiness (congestion).  Itchy, runny nose with sneezing and tearing of the eyes. DIAGNOSIS Your health care provider can help you determine the allergen or allergens that trigger your symptoms. If you and your health care provider are unable to determine the allergen, skin or blood testing may be used. Your health care provider will diagnose your condition after taking your health history and performing a physical exam. Your health care provider may assess you for other related conditions, such as asthma, pink eye, or an ear infection. TREATMENT Allergic rhinitis does not have a cure, but it can be controlled by:  Medicines that block allergy symptoms. These may include allergy shots, nasal sprays, and oral antihistamines.  Avoiding the allergen. Hay fever may often be treated with antihistamines in pill or nasal spray forms. Antihistamines block the effects of histamine. There are over-the-counter medicines that may help with nasal congestion and swelling around the eyes. Check with your health care provider before taking or giving this medicine. If avoiding the allergen or the medicine prescribed do not work, there are many new medicines  your health care provider can prescribe. Stronger medicine may be used if initial measures are ineffective. Desensitizing injections can be used if medicine and avoidance does not work. Desensitization is when a patient is given ongoing shots until the body becomes less sensitive to the allergen. Make sure you follow up with your health care provider if problems continue. HOME CARE INSTRUCTIONS It is not possible to completely avoid allergens, but you can reduce your symptoms by taking steps to limit your exposure to them. It helps to know exactly what you are allergic to so that you can avoid your specific triggers. SEEK MEDICAL CARE IF:  You have a fever.  You develop a cough that does not stop easily (persistent).  You have shortness of breath.  You start wheezing.  Symptoms interfere with normal daily activities.   This information is not intended to replace advice given to you by your health care provider. Make sure you discuss any questions you have with your health care provider.   Document Released: 03/12/2001 Document Revised: 07/08/2014 Document Reviewed: 02/22/2013 Elsevier Interactive Patient Education 2016 Elsevier Inc.  

## 2015-08-07 NOTE — Progress Notes (Signed)
HPI  Pt presents to the clinic today with c/o cough with productive yellow sputum for the past 4 days. She admits to a sore throat that started before the cough. She has felt facial pressure and ear fullness for the past day. She says she has constant chest pain when coughing and some difficulty breathing. She started to have chills and body aches. She denies eye pain/itching/draining and fever.  She has tried OTC Robutussin and Nyquil with minimal relief. She was sick before christmas and her gyno put her on antibiotics which cleared the symptoms. She denies any sick contacts.    Review of Systems      Past Medical History  Diagnosis Date  . CIN III (cervical intraepithelial neoplasia III)   . IBS (irritable bowel syndrome)   . Fibroid   . Endometriosis   . Osteoporosis 06/2015    T score -2.8 AP spine    Family History  Problem Relation Age of Onset  . Diverticulitis Mother   . Alzheimer's disease Maternal Aunt   . Alzheimer's disease Maternal Grandmother   . Alzheimer's disease Maternal Aunt   . Colon cancer Neg Hx   . Esophageal cancer Neg Hx   . Rectal cancer Neg Hx   . Stomach cancer Neg Hx   . Ulcerative colitis Maternal Aunt   . Ulcerative colitis Cousin     Social History   Social History  . Marital Status: Married    Spouse Name: N/A  . Number of Children: N/A  . Years of Education: N/A   Occupational History  . Not on file.   Social History Main Topics  . Smoking status: Never Smoker   . Smokeless tobacco: Never Used  . Alcohol Use: No  . Drug Use: No  . Sexual Activity: Yes    Birth Control/ Protection: None   Other Topics Concern  . Not on file   Social History Narrative    Allergies  Allergen Reactions  . Sulfonamide Derivatives     hives     Constitutional: Positive headache, fatigue. Denies fever or abrupt weight changes.  HEENT:  Positive sore throat. Denies eye redness, eye pain, pressure behind the eyes, facial pain, nasal  congestion, ear pain, ringing in the ears, wax buildup, runny nose or bloody nose. Respiratory: Positive cough. Denies difficulty breathing or shortness of breath.  Cardiovascular: Denies chest pain, chest tightness, palpitations or swelling in the hands or feet.   No other specific complaints in a complete review of systems (except as listed in HPI above).  Objective:  BP 114/70 mmHg  Pulse 83  Temp(Src) 98.3 F (36.8 C) (Oral)  Wt 194 lb (87.998 kg)  SpO2 98%  LMP 11/02/2012  Wt Readings from Last 3 Encounters:  08/07/15 194 lb (87.998 kg)  05/02/15 192 lb (87.091 kg)  12/20/14 186 lb 4 oz (84.482 kg)     General: Appears her stated age, in NAD. HEENT: Head: normal shape and size, no sinus tenderness noted; Eyes: sclera white, no icterus, conjunctiva pink; Ears: Tm's gray and intact with serous fluid behind TMs B/L, normal light reflex; Nose: mucosa pink and moist, septum midline; Throat/Mouth: + PND. Teeth present, mucosa erythematous and moist, no exudate noted, no lesions or ulcerations noted.  Neck: No cervical lymphadenopathy.  Cardiovascular: Normal rate and rhythm. S1,S2 noted.  No murmur, rubs or gallops noted.  Pulmonary/Chest: Normal effort and positive vesicular breath sounds. No respiratory distress. No wheezes, rales or ronchi noted.  Assessment & Plan:   Allergic Rhinitis:  Get some rest and drink plenty of water Do salt water gargles for the sore throat Flonase daily Zyrtec daily  Rx for Prednisone taper  Education on bacterial s/s, told to call back if they occur   RTC as needed or if symptoms persist.

## 2016-05-01 ENCOUNTER — Other Ambulatory Visit: Payer: Self-pay | Admitting: Gynecology

## 2016-05-01 DIAGNOSIS — Z1231 Encounter for screening mammogram for malignant neoplasm of breast: Secondary | ICD-10-CM

## 2016-05-09 ENCOUNTER — Encounter: Payer: Commercial Managed Care - HMO | Admitting: Women's Health

## 2016-05-14 ENCOUNTER — Encounter: Payer: Self-pay | Admitting: Women's Health

## 2016-05-14 ENCOUNTER — Ambulatory Visit
Admission: RE | Admit: 2016-05-14 | Discharge: 2016-05-14 | Disposition: A | Discharge status: Home / Self Care | Payer: Commercial Managed Care - HMO | Source: Ambulatory Visit | Attending: Gynecology | Admitting: Gynecology

## 2016-05-14 ENCOUNTER — Ambulatory Visit (INDEPENDENT_AMBULATORY_CARE_PROVIDER_SITE_OTHER): Payer: Commercial Managed Care - HMO | Admitting: Women's Health

## 2016-05-14 VITALS — BP 118/76 | Ht 68.0 in | Wt 193.0 lb

## 2016-05-14 DIAGNOSIS — M810 Age-related osteoporosis without current pathological fracture: Secondary | ICD-10-CM

## 2016-05-14 DIAGNOSIS — Z1231 Encounter for screening mammogram for malignant neoplasm of breast: Secondary | ICD-10-CM

## 2016-05-14 DIAGNOSIS — Z1151 Encounter for screening for human papillomavirus (HPV): Secondary | ICD-10-CM | POA: Diagnosis not present

## 2016-05-14 DIAGNOSIS — Z13220 Encounter for screening for lipoid disorders: Secondary | ICD-10-CM

## 2016-05-14 DIAGNOSIS — Z01419 Encounter for gynecological examination (general) (routine) without abnormal findings: Secondary | ICD-10-CM

## 2016-05-14 LAB — CBC WITH DIFFERENTIAL/PLATELET
Basophils Absolute: 60 cells/uL (ref 0–200)
Basophils Relative: 1 %
Eosinophils Absolute: 120 cells/uL (ref 15–500)
Eosinophils Relative: 2 %
HCT: 40.7 % (ref 35.0–45.0)
Hemoglobin: 14.1 g/dL (ref 11.7–15.5)
Lymphocytes Relative: 41 %
Lymphs Abs: 2460 cells/uL (ref 850–3900)
MCH: 33.3 pg — ABNORMAL HIGH (ref 27.0–33.0)
MCHC: 34.6 g/dL (ref 32.0–36.0)
MCV: 96 fL (ref 80.0–100.0)
MPV: 9.1 fL (ref 7.5–12.5)
Monocytes Absolute: 360 cells/uL (ref 200–950)
Monocytes Relative: 6 %
Neutro Abs: 3000 cells/uL (ref 1500–7800)
Neutrophils Relative %: 50 %
Platelets: 271 10*3/uL (ref 140–400)
RBC: 4.24 MIL/uL (ref 3.80–5.10)
RDW: 13.1 % (ref 11.0–15.0)
WBC: 6 10*3/uL (ref 3.8–10.8)

## 2016-05-14 LAB — COMPREHENSIVE METABOLIC PANEL
ALT: 15 U/L (ref 6–29)
AST: 17 U/L (ref 10–35)
Albumin: 4.2 g/dL (ref 3.6–5.1)
Alkaline Phosphatase: 59 U/L (ref 33–130)
BUN: 13 mg/dL (ref 7–25)
CO2: 27 mmol/L (ref 20–31)
Calcium: 8.9 mg/dL (ref 8.6–10.4)
Chloride: 105 mmol/L (ref 98–110)
Creat: 0.75 mg/dL (ref 0.50–1.05)
Glucose, Bld: 96 mg/dL (ref 65–99)
Potassium: 3.9 mmol/L (ref 3.5–5.3)
Sodium: 140 mmol/L (ref 135–146)
Total Bilirubin: 0.5 mg/dL (ref 0.2–1.2)
Total Protein: 6.9 g/dL (ref 6.1–8.1)

## 2016-05-14 LAB — LIPID PANEL
Cholesterol: 191 mg/dL (ref <200)
HDL: 42 mg/dL — ABNORMAL LOW (ref >50)
LDL Cholesterol: 119 mg/dL — ABNORMAL HIGH (ref <100)
Total CHOL/HDL Ratio: 4.5 Ratio (ref <5.0)
Triglycerides: 148 mg/dL (ref <150)
VLDL: 30 mg/dL (ref <30)

## 2016-05-14 MED ORDER — ALENDRONATE SODIUM 70 MG PO TABS: 70.0000 mg | ORAL_TABLET | ORAL | 11 refills | Status: DC

## 2016-05-14 NOTE — Addendum Note (Signed)
Addended by: Nelva Nay on: 05/14/2016 10:52 AM   Modules accepted: Orders

## 2016-05-14 NOTE — Patient Instructions (Signed)

## 2016-05-14 NOTE — Progress Notes (Signed)
YAHAYRA SAINTVIL [birth year] [id number]    History:    Presents for annual exam.  Postmenopausal on no bleeding with no HRT. 1999 LEEP for CIN-3 with normal Paps after. Normal mammogram history. Osteoporosis, T score -2.8 at spine 9% loss started on Fosamax January 2017, tolerating well. 2016 benign colon polyps 5 year follow-up. 2000 for myomectomy. History of endometriosis with infertility has adopted 3 children from [place] 52-year-old, 29-year-old with cleft lip and palate and daughter Jinny Blossom age 45 with Down's.  Past medical history, past surgical history, family history and social history were all reviewed and documented in the EPIC chart. Retired Pharmacist, hospital. History of IBS.  ROS:  A ROS was performed and pertinent positives and negatives are included.  Exam:  Vitals:   05/14/16 0956  BP: 118/76  Weight: 193 lb (87.5 kg)  Height: 5\' 8"  (1.727 m)   Body mass index is 29.35 kg/m.   General appearance:  Normal Thyroid:  Symmetrical, normal in size, without palpable masses or nodularity. Respiratory  Auscultation:  Clear without wheezing or rhonchi Cardiovascular  Auscultation:  Regular rate, without rubs, murmurs or gallops  Edema/varicosities:  Not grossly evident Abdominal  Soft,nontender, without masses, guarding or rebound.  Liver/spleen:  No organomegaly noted  Hernia:  None appreciated  Skin  Inspection:  Grossly normal   Breasts: Examined lying and sitting.     Right: Without masses, retractions, discharge or axillary adenopathy.     Left: Without masses, retractions, discharge or axillary adenopathy. Gentitourinary   Inguinal/mons:  Normal without inguinal adenopathy  External genitalia:  Normal  BUS/Urethra/Skene's glands:  Normal  Vagina:  Atrophic  Cervix:  Normal  Uterus:  normal in size, shape and contour.  Midline and mobile  Adnexa/parametria:     Rt: Without masses or tenderness.   Lt: Without masses or tenderness.  Anus and perineum: Normal  Digital rectal  exam: Normal sphincter tone without palpated masses or tenderness  Assessment/Plan:  50 y.o. MWF G1 P0 3 adopted for annual exam with no complaints.  Postmenopausal with no bleeding on no HRT 2016 Osteoporosis Fosamax started January 2017 /T score -2.8 at spine 1999 LEEP for CIN-3 with normal Paps after 2016 benign colon polyp 5 year follow-up  Plan: Fosamax 70 mg by mouth weekly proper administration reviewed. Description given. Will repeat DEXA next year. SBE's, continue annual 3-D screening mammogram has scheduled. Exercise, calcium rich diet, vitamin D 1000 daily encouraged. Reviewed importance of weightbearing exercise, home safety, fall prevention, yoga encouraged. CBC, vitamin D, CMP, lipid panel, UA, Pap with HR HPV typing, new screening guidelines reviewed.   Huel Cote [facility], 10:34 AM 05/14/2016

## 2016-05-15 LAB — URINALYSIS W MICROSCOPIC + REFLEX CULTURE
Bacteria, UA: NONE SEEN [HPF]
Bilirubin Urine: NEGATIVE
Casts: NONE SEEN [LPF]
Crystals: NONE SEEN [HPF]
Glucose, UA: NEGATIVE
Hgb urine dipstick: NEGATIVE
Ketones, ur: NEGATIVE
Leukocytes, UA: NEGATIVE
Nitrite: NEGATIVE
Protein, ur: NEGATIVE
RBC / HPF: NONE SEEN RBC/HPF (ref <=2)
Specific Gravity, Urine: 1.018 (ref 1.001–1.035)
Squamous Epithelial / LPF: NONE SEEN [HPF] (ref <=5)
WBC, UA: NONE SEEN WBC/HPF (ref <=5)
Yeast: NONE SEEN [HPF]
pH: 7 (ref 5.0–8.0)

## 2016-05-15 LAB — VITAMIN D 25 HYDROXY (VIT D DEFICIENCY, FRACTURES): Vit D, 25-Hydroxy: 44 ng/mL (ref 30–100)

## 2016-05-16 ENCOUNTER — Encounter: Payer: Self-pay | Admitting: Women's Health

## 2016-05-18 LAB — PAP, TP IMAGING W/ HPV RNA, RFLX HPV TYPE 16,18/45: HPV mRNA, High Risk: NOT DETECTED

## 2016-06-17 ENCOUNTER — Ambulatory Visit (INDEPENDENT_AMBULATORY_CARE_PROVIDER_SITE_OTHER): Payer: Commercial Managed Care - HMO | Admitting: Gynecology

## 2016-06-17 ENCOUNTER — Encounter: Payer: Self-pay | Admitting: Gynecology

## 2016-06-17 VITALS — BP 118/74

## 2016-06-17 DIAGNOSIS — R300 Dysuria: Secondary | ICD-10-CM | POA: Diagnosis not present

## 2016-06-17 DIAGNOSIS — N950 Postmenopausal bleeding: Secondary | ICD-10-CM

## 2016-06-17 LAB — URINALYSIS W MICROSCOPIC + REFLEX CULTURE
Bilirubin Urine: NEGATIVE
Casts: NONE SEEN [LPF]
Crystals: NONE SEEN [HPF]
Glucose, UA: NEGATIVE
Leukocytes, UA: NEGATIVE
Nitrite: NEGATIVE
Specific Gravity, Urine: 1.025 (ref 1.001–1.035)
Yeast: NONE SEEN [HPF]
pH: 7 (ref 5.0–8.0)

## 2016-06-17 NOTE — Progress Notes (Signed)
    Jennifer Velez Oct 09, 1965 [id number]        50 y.o.  G1P0010 presents complaining of vaginal bleeding since yesterday. Last bleeding 3 years ago. Is having some cramping with the bleeding and states that she feels almost premenstrual like she did when she was having her menses. Also one day of mild dysuria. No frequency urgency low back pain fever or chills.  Past medical history,surgical history, problem list, medications, allergies, family history and social history were all reviewed and documented in the EPIC chart.  Directed ROS with pertinent positives and negatives documented in the history of present illness/assessment and plan.  Exam: Caryn Bee assistant Vitals:   06/17/16 1126  BP: 118/74   General appearance:  Normal Spine straight without CVA tenderness Abdomen soft nontender without masses guarding rebound Pelvic external BUS vagina with white menses flow. Cervix normal. Uterus is in normal midline mobile and tender. Adnexa without masses or tenderness.  Assessment/Plan:  50 y.o. G1P0010 with postmenopausal bleeding. Does have a history of leiomyoma. Reviewed differential to include hormonal, structural such as polyps or leiomyoma, hyperplasia and malignancy. Recommend start with sonohysterogram for pelvic assessment and endometrial sampling. Patient will schedule follow up for this. Having mild dysuria for 1 day. Urinalysis looks contaminated noting 0-5 WBC 20-40 RBC 10-20 squamous and moderate bacteria. Will culture and treat per culture results if needed.    Anastasio Auerbach MD, 11:57 AM 06/17/2016

## 2016-06-17 NOTE — Addendum Note (Signed)
Addended by: Nelva Nay on: 06/17/2016 12:09 PM   Modules accepted: Orders

## 2016-06-17 NOTE — Patient Instructions (Signed)
Follow up for ultrasound as scheduled.  Office will call you if your urine grows out bacteria that needs to be treated with antibiotics.

## 2016-06-19 LAB — URINE CULTURE

## 2016-07-02 ENCOUNTER — Other Ambulatory Visit: Payer: Self-pay | Admitting: Gynecology

## 2016-07-02 DIAGNOSIS — N950 Postmenopausal bleeding: Secondary | ICD-10-CM

## 2016-07-05 ENCOUNTER — Telehealth: Payer: Self-pay | Admitting: *Deleted

## 2016-07-05 NOTE — Telephone Encounter (Signed)
Per Corene Cornea at [facility] ref # [id number] [facility] codes [id number], 68, [id number], [phone] covered 100% performed in the doctors office. KW CMA

## 2016-07-08 ENCOUNTER — Ambulatory Visit: Payer: Commercial Managed Care - HMO | Admitting: Gynecology

## 2016-07-08 ENCOUNTER — Other Ambulatory Visit: Payer: Commercial Managed Care - HMO

## 2016-07-24 ENCOUNTER — Ambulatory Visit (INDEPENDENT_AMBULATORY_CARE_PROVIDER_SITE_OTHER): Payer: Commercial Managed Care - HMO | Admitting: Gynecology

## 2016-07-24 ENCOUNTER — Ambulatory Visit (INDEPENDENT_AMBULATORY_CARE_PROVIDER_SITE_OTHER): Payer: Commercial Managed Care - HMO

## 2016-07-24 ENCOUNTER — Other Ambulatory Visit: Payer: Self-pay | Admitting: Gynecology

## 2016-07-24 DIAGNOSIS — N950 Postmenopausal bleeding: Secondary | ICD-10-CM | POA: Diagnosis not present

## 2016-07-24 DIAGNOSIS — R102 Pelvic and perineal pain: Secondary | ICD-10-CM | POA: Diagnosis not present

## 2016-07-24 DIAGNOSIS — D251 Intramural leiomyoma of uterus: Secondary | ICD-10-CM

## 2016-07-24 MED ORDER — MISOPROSTOL 200 MCG PO TABS: 200.0000 ug | ORAL_TABLET | Freq: Four times a day (QID) | ORAL | 0 refills | Status: DC

## 2016-07-24 MED ORDER — LIDOCAINE HCL 1 % IJ SOLN
10.0000 mL | Freq: Once | INTRAMUSCULAR | Status: AC
  Administered 2016-07-24: 10 mL

## 2016-07-24 MED ORDER — MISOPROSTOL 200 MCG PO TABS: ORAL_TABLET | ORAL | 0 refills | Status: DC

## 2016-07-24 NOTE — Progress Notes (Signed)
    Jennifer Velez April 01, 1966 [id number]        51 y.o.  G1P0010 presents for sonohysterogram after bleeding for several days in December. Has done no bleeding since then.  Past medical history,surgical history, problem list, medications, allergies, family history and social history were all reviewed and documented in the EPIC chart.  Directed ROS with pertinent positives and negatives documented in the history of present illness/assessment and plan.  Exam: [place] assistant General appearance:  Normal Abdomen soft nontender without masses guarding rebound Pelvic external BUS vagina normal. Cervix normal. Uterus grossly normal size midline mobile nontender. Adnexa without masses or tenderness.  Ultrasound transvaginal and transabdominal shows uterus normal size and echotexture. Anteverted. 2 small myomas noted at 22 mm and 23 mm. Endometrial echo 8.6 mm. Left ovary with corpus luteal cyst 13 x 12 x 16 mm. Right ovary with follicle 12 x 7 mm. Cul-de-sac negative.  Sonohysterogram attempted, sterile technique with initial attempts to pass the catheter unsuccessful. A paracervical block using 1% lidocaine, 8 cc total was placed. Single-tooth tenaculum anterior lip stabilization and again repeated attempts to pass a catheter were unsuccessful. Using a disposable dilator I was unable to negotiate the endocervical canal. At this point secondary to discomfort the procedure was aborted.   Assessment and plan:  51 year old G1 P0010 with episode of postmenopausal bleeding after 3 years of amenorrhea. Endometrial echo generous at 8.6 mm. Unable to negotiate the endocervical canal. Recommend repeated attempt after Cytotec 200 g intravaginal night before. Patient understands I stoma not be able to perform the sonohysterogram and that ultimately we made need to proceed to a hysteroscopy D&C but I think that it is worth another trial after Cytotec to see if we can't soften the cervix and allow catheter  placement.  Patient agrees with the plan and will return in several weeks for reattempt of the sonohysterogram.    Anastasio Auerbach MD, 2:55 PM 07/24/2016

## 2016-07-24 NOTE — Patient Instructions (Signed)
Follow up for ultrasound as scheduled. Place the vaginal tablet the night before the procedure.

## 2016-08-09 ENCOUNTER — Ambulatory Visit (INDEPENDENT_AMBULATORY_CARE_PROVIDER_SITE_OTHER): Payer: Commercial Managed Care - HMO | Admitting: Gynecology

## 2016-08-09 ENCOUNTER — Encounter: Payer: Self-pay | Admitting: Gynecology

## 2016-08-09 ENCOUNTER — Ambulatory Visit (INDEPENDENT_AMBULATORY_CARE_PROVIDER_SITE_OTHER): Payer: Commercial Managed Care - HMO

## 2016-08-09 ENCOUNTER — Other Ambulatory Visit: Payer: Self-pay | Admitting: Gynecology

## 2016-08-09 VITALS — BP 122/74

## 2016-08-09 DIAGNOSIS — R102 Pelvic and perineal pain: Secondary | ICD-10-CM | POA: Diagnosis not present

## 2016-08-09 DIAGNOSIS — N950 Postmenopausal bleeding: Secondary | ICD-10-CM

## 2016-08-09 DIAGNOSIS — R9389 Abnormal findings on diagnostic imaging of other specified body structures: Secondary | ICD-10-CM

## 2016-08-09 DIAGNOSIS — N882 Stricture and stenosis of cervix uteri: Secondary | ICD-10-CM

## 2016-08-09 DIAGNOSIS — R938 Abnormal findings on diagnostic imaging of other specified body structures: Secondary | ICD-10-CM

## 2016-08-09 MED ORDER — LIDOCAINE HCL 1 % IJ SOLN
10.0000 mL | Freq: Once | INTRAMUSCULAR | Status: AC
  Administered 2016-08-09: 10 mL

## 2016-08-09 NOTE — Progress Notes (Signed)
    Jennifer Velez 07-29-1965 [id number]        51 y.o.  G1P0010 presents for sonohysterogram. Had several days of bleeding in December. Ultrasound showed uterus normal size and echotexture with 2 small myomas measuring 22 mm and 23 mm. Endometrial echo is 8.6 mm. Left ovary with corpus luteal cyst and right ovary with small follicle. Cul-de-sac negative. Attempted sonohysterogram was aborted after unable to negotiate the endocervical canal and pain with the patient. She was given Cytotec 200 g intravaginal last night and presents for attempted repeat sonohysterogram. She has done no further bleeding.  Past medical history,surgical history, problem list, medications, allergies, family history and social history were all reviewed and documented in the EPIC chart.  Directed ROS with pertinent positives and negatives documented in the history of present illness/assessment and plan.  Exam: [place] assistant Vitals:   08/09/16 1030  BP: 122/74   General appearance:  Normal Abdomen soft nontender without masses guarding rebound Pelvic external BUS vagina with atrophic changes. Cervix normal. Uterus grossly normal mid mobile nontender. Adnexa without masses or tenderness.  Ultrasound limited shows endometrial echo 6.2 mm.  Sonohysterogram performed, sterile technique, paracervical block 1% lidocaine 8 cc total, single-tooth tenaculum intraoperative stabilization, initial attempt to pass a catheter unsuccessful. Using cervical dilators the cervix was dilated and the catheter was subsequently introduced without difficulty. There was good distention with no abnormalities. Endometrial sample taken. Patient tolerated well.  Assessment/Plan:  51 y.o. G1P0010 with single episode of postmenopausal bleeding. Several small myomas intramural. Sonohysterogram negative for cavity defects. Endometrial echo slightly less than last measurement. Patient will follow up for biopsy results. Assuming acceptable then  we'll monitor and she'll report any further bleeding. Patient knows to expect a call within days and call our office if she does not hear with the biopsy results    Anastasio Auerbach MD, 10:38 AM 08/09/2016

## 2016-08-09 NOTE — Patient Instructions (Signed)
Biopsy results

## 2016-08-29 ENCOUNTER — Encounter: Payer: Self-pay | Admitting: Family Medicine

## 2016-08-29 ENCOUNTER — Ambulatory Visit (INDEPENDENT_AMBULATORY_CARE_PROVIDER_SITE_OTHER): Payer: Commercial Managed Care - HMO | Admitting: Family Medicine

## 2016-08-29 VITALS — BP 112/78 | HR 78 | Temp 98.1°F | Wt 194.0 lb

## 2016-08-29 DIAGNOSIS — H6504 Acute serous otitis media, recurrent, right ear: Secondary | ICD-10-CM | POA: Diagnosis not present

## 2016-08-29 MED ORDER — AMOXICILLIN-POT CLAVULANATE 875-125 MG PO TABS: 1.0000 | ORAL_TABLET | Freq: Two times a day (BID) | ORAL | 0 refills | Status: DC

## 2016-08-29 NOTE — Patient Instructions (Signed)
Great to see you. Please take augment as directed- 1 tablet twice daily x 10 days. Ibuprofen and tylenol as needed for pain.

## 2016-08-29 NOTE — Progress Notes (Signed)
SUBJECTIVE:  Jennifer Velez is a 51 y.o. female who complains of coryza, congestion, dry cough, myalgias, headache and right ear no fever, no drainage for 5 days. She denies a history of anorexia, chest pain and chills and denies a history of asthma. Patient denies smoke cigarettes.   Current Outpatient Prescriptions on File Prior to Visit  Medication Sig Dispense Refill  . alendronate (FOSAMAX) 70 MG tablet Take 1 tablet (70 mg total) by mouth every 7 (seven) days. Take with a full glass of water on an empty stomach. 4 tablet 11  . IBUPROFEN PO Take by mouth as needed.     . misoprostol (CYTOTEC) 200 MCG tablet Insert tab VAGINALLY hs before procedure. 1 tablet 0  . VITAMIN D, ERGOCALCIFEROL, PO Take 1 tablet by mouth as needed.     No current facility-administered medications on file prior to visit.     Allergies  Allergen Reactions  . Sulfonamide Derivatives     hives    Past Medical History:  Diagnosis Date  . CIN III (cervical intraepithelial neoplasia III)   . Endometriosis   . Fibroid   . IBS (irritable bowel syndrome)   . Osteoporosis 06/2015   T score -2.8 AP spine    Past Surgical History:  Procedure Laterality Date  . CERVICAL BIOPSY  W/ LOOP ELECTRODE EXCISION  1999  . COLPOSCOPY    . EXPLORATORY LAPAROTOMY  2004   myomectomy and excision endometriosis  . PELVIC LAPAROSCOPY     x2    Family History  Problem Relation Age of Onset  . Diverticulitis Mother   . Alzheimer's disease Maternal Aunt   . Alzheimer's disease Maternal Grandmother   . Alzheimer's disease Maternal Aunt   . Ulcerative colitis Maternal Aunt   . Ulcerative colitis Cousin   . Colon cancer Neg Hx   . Esophageal cancer Neg Hx   . Rectal cancer Neg Hx   . Stomach cancer Neg Hx     Social History   Social History  . Marital status: Married    Spouse name: N/A  . Number of children: N/A  . Years of education: N/A   Occupational History  . Not on file.   Social History Main Topics   . Smoking status: Never Smoker  . Smokeless tobacco: Never Used  . Alcohol use No  . Drug use: No  . Sexual activity: Yes    Birth control/ protection: None   Other Topics Concern  . Not on file   Social History Narrative  . No narrative on file   The PMH, PSH, Social History, Family History, Medications, and allergies have been reviewed in [facility], and have been updated if relevant.  OBJECTIVE: BP 112/78   Pulse 78   Temp 98.1 F (36.7 C) (Oral)   Wt 194 lb (88 kg)   LMP 11/02/2012   SpO2 98%   BMI 29.50 kg/m   She appears well, vital signs are as noted. Ears- right TM, red, dull.  Throat and pharynx erythematous without exudate  Neck supple. No adenopathy in the neck. Nose is congested. Sinuses non tender. The chest is clear, without wheezes or rales.  ASSESSMENT:  otitis media  PLAN: Augmentin twice daily x 10 days. Symptomatic therapy suggested: push fluids, rest and return office visit prn if symptoms persist or worsen.. Call or return to clinic prn if these symptoms worsen or fail to improve as anticipated.

## 2016-09-16 ENCOUNTER — Telehealth: Payer: Self-pay

## 2016-09-16 MED ORDER — AMOXICILLIN-POT CLAVULANATE 875-125 MG PO TABS: 1.0000 | ORAL_TABLET | Freq: Two times a day (BID) | ORAL | 0 refills | Status: AC

## 2016-09-16 NOTE — Telephone Encounter (Signed)
Spoke to pt. She will come in if not better.

## 2016-09-16 NOTE — Telephone Encounter (Addendum)
Pt left v/m ; pt seen 08/29/16; pt finished abx weekend of 09/07/16; pt continues with congestion and earache and pt request refill of abx.; no available appt at [facility] or [place]. Unable to reach pt by phone.Please advise. Pt called back and she said she cannot afford another copay to be seen so pt wants abx sent to pharmacy.

## 2016-09-16 NOTE — Telephone Encounter (Signed)
rx refilled but needs to be seen if there is no improvement after second round.

## 2017-01-08 ENCOUNTER — Telehealth: Payer: Self-pay | Admitting: *Deleted

## 2017-01-08 NOTE — Telephone Encounter (Signed)
Pt called left message in voicemail requesting name of ENT office, I called and left message with husband with [facility] [phone]

## 2017-01-23 DIAGNOSIS — K449 Diaphragmatic hernia without obstruction or gangrene: Secondary | ICD-10-CM | POA: Diagnosis not present

## 2017-01-23 DIAGNOSIS — K219 Gastro-esophageal reflux disease without esophagitis: Secondary | ICD-10-CM | POA: Insufficient documentation

## 2017-01-23 DIAGNOSIS — R490 Dysphonia: Secondary | ICD-10-CM | POA: Insufficient documentation

## 2017-01-23 DIAGNOSIS — R09A2 Foreign body sensation, throat: Secondary | ICD-10-CM | POA: Insufficient documentation

## 2017-04-16 ENCOUNTER — Other Ambulatory Visit: Payer: Self-pay | Admitting: *Deleted

## 2017-04-16 ENCOUNTER — Other Ambulatory Visit: Payer: Self-pay | Admitting: Gynecology

## 2017-04-16 DIAGNOSIS — Z1231 Encounter for screening mammogram for malignant neoplasm of breast: Secondary | ICD-10-CM

## 2017-04-16 DIAGNOSIS — M818 Other osteoporosis without current pathological fracture: Secondary | ICD-10-CM

## 2017-04-29 DIAGNOSIS — Z23 Encounter for immunization: Secondary | ICD-10-CM | POA: Diagnosis not present

## 2017-05-15 ENCOUNTER — Ambulatory Visit: Payer: Commercial Managed Care - HMO

## 2017-05-26 ENCOUNTER — Ambulatory Visit
Admission: RE | Admit: 2017-05-26 | Discharge: 2017-05-26 | Disposition: A | Discharge status: Home / Self Care | Payer: Commercial Managed Care - HMO | Source: Ambulatory Visit | Attending: Gynecology | Admitting: Gynecology

## 2017-05-26 DIAGNOSIS — Z1231 Encounter for screening mammogram for malignant neoplasm of breast: Secondary | ICD-10-CM

## 2017-05-30 ENCOUNTER — Other Ambulatory Visit: Payer: Self-pay | Admitting: Women's Health

## 2017-05-30 DIAGNOSIS — M810 Age-related osteoporosis without current pathological fracture: Secondary | ICD-10-CM

## 2017-06-03 ENCOUNTER — Other Ambulatory Visit: Payer: Self-pay | Admitting: Gynecology

## 2017-06-03 ENCOUNTER — Telehealth: Payer: Self-pay | Admitting: Gynecology

## 2017-06-03 ENCOUNTER — Ambulatory Visit (INDEPENDENT_AMBULATORY_CARE_PROVIDER_SITE_OTHER): Payer: 59

## 2017-06-03 ENCOUNTER — Encounter: Payer: Self-pay | Admitting: Gynecology

## 2017-06-03 DIAGNOSIS — M810 Age-related osteoporosis without current pathological fracture: Secondary | ICD-10-CM

## 2017-06-03 DIAGNOSIS — Z13820 Encounter for screening for osteoporosis: Secondary | ICD-10-CM

## 2017-06-03 DIAGNOSIS — M818 Other osteoporosis without current pathological fracture: Secondary | ICD-10-CM

## 2017-06-03 NOTE — Telephone Encounter (Signed)
Tell patient her most recent bone density is stable if not improved from her prior study.  I would remain on Fosamax and we will repeat the bone density in 2 years.

## 2017-06-04 ENCOUNTER — Encounter: Payer: Self-pay | Admitting: *Deleted

## 2017-06-04 NOTE — Telephone Encounter (Signed)
Sent mychart message

## 2017-06-06 ENCOUNTER — Ambulatory Visit (INDEPENDENT_AMBULATORY_CARE_PROVIDER_SITE_OTHER): Payer: 59 | Admitting: Gynecology

## 2017-06-06 ENCOUNTER — Encounter: Payer: Self-pay | Admitting: Gynecology

## 2017-06-06 VITALS — BP 118/76 | Ht 67.5 in | Wt 190.0 lb

## 2017-06-06 DIAGNOSIS — Z01411 Encounter for gynecological examination (general) (routine) with abnormal findings: Secondary | ICD-10-CM | POA: Diagnosis not present

## 2017-06-06 DIAGNOSIS — M810 Age-related osteoporosis without current pathological fracture: Secondary | ICD-10-CM

## 2017-06-06 DIAGNOSIS — Z13220 Encounter for screening for lipoid disorders: Secondary | ICD-10-CM

## 2017-06-06 DIAGNOSIS — N952 Postmenopausal atrophic vaginitis: Secondary | ICD-10-CM | POA: Diagnosis not present

## 2017-06-06 MED ORDER — ALENDRONATE SODIUM 70 MG PO TABS: ORAL_TABLET | ORAL | 11 refills | Status: DC

## 2017-06-06 NOTE — Progress Notes (Signed)
    Jennifer Velez [birth year] [id number]        51 y.o.  G1P0010 for annual gynecologic exam.  Doing well from a gynecologic standpoint.  Sonohysterogram with endometrial biopsy earlier this year due to postmenopausal bleeding.  No  Past medical history,surgical history, problem list, medications, allergies, family history and social history were all reviewed and documented as reviewed in the EPIC chart.  ROS:  Performed with pertinent positives and negatives included in the history, assessment and plan.   Additional significant findings : None   Exam: Caryn Bee assistant Vitals:   06/06/17 1023  BP: 118/76  Weight: 190 lb (86.2 kg)  Height: 5' 7.5" (1.715 m)   Body mass index is 29.32 kg/m.  General appearance:  Normal affect, orientation and appearance. Skin: Grossly normal HEENT: Without gross lesions.  No cervical or supraclavicular adenopathy. Thyroid normal.  Lungs:  Clear without wheezing, rales or rhonchi Cardiac: RR, without RMG Abdominal:  Soft, nontender, without masses, guarding, rebound, organomegaly or hernia Breasts:  Examined lying and sitting without masses, retractions, discharge or axillary adenopathy. Pelvic:  Ext, BUS, Vagina: With atrophic changes  Cervix: With atrophic changes  Uterus: Anteverted, normal size, shape and contour, midline and mobile nontender   Adnexa: Without masses or tenderness    Anus and perineum: Normal   Rectovaginal: Normal sphincter tone without palpated masses or tenderness.    Assessment/Plan:  51 y.o. G23P0010 female for annual gynecologic exam.   1. Postmenopausal/mild atrophic changes.  No significant hot flushes, night sweats, vaginal dryness or bleeding since earlier this year.  Sonohysterogram and endometrial biopsy negative earlier.  Will call if there is any further bleeding. 2. Osteoporosis.  DEXA 2016 T score -2.8.  DEXA 05/2017 T score -2.0.  On alendronate for 2 years now.  We will continue for another 2 years and  repeat her DEXA in 2 years.  Refill times 1 year provided.  Check vitamin D level and TSH today. 3. Mammography 05/2017.  Continue with annual mammography when due.  Breast exam normal today. 4. Pap smear 2016.  History of CIN-3 with LEEP 1999 with normal Pap smears afterwards.  No Pap smear done today.  Plan repeat Pap smear next year at 3-year interval. 5. Colonoscopy 2016.  Repeat at their recommended interval. 6. Health maintenance.  Fasting orders placed for CBC, CMP, lipid profile, vitamin D, TSH.  Patient will return fasting.  Follow-up in 1 year, sooner as needed.   Anastasio Auerbach MD, 11:05 AM 06/06/2017

## 2017-06-06 NOTE — Patient Instructions (Signed)
Follow-up for fasting blood work as arranged

## 2017-06-07 LAB — URINALYSIS W MICROSCOPIC + REFLEX CULTURE
Bacteria, UA: NONE SEEN /HPF
Bilirubin Urine: NEGATIVE
Glucose, UA: NEGATIVE
Hgb urine dipstick: NEGATIVE
Hyaline Cast: NONE SEEN /LPF
Ketones, ur: NEGATIVE
Leukocyte Esterase: NEGATIVE
Nitrites, Initial: NEGATIVE
Protein, ur: NEGATIVE
RBC / HPF: NONE SEEN /HPF (ref 0–2)
Specific Gravity, Urine: 1.014 (ref 1.001–1.03)
Squamous Epithelial / LPF: NONE SEEN /HPF (ref <=5)
WBC, UA: NONE SEEN /HPF (ref 0–5)
pH: 6 (ref 5.0–8.0)

## 2017-06-07 LAB — NO CULTURE INDICATED

## 2017-06-27 DIAGNOSIS — H5213 Myopia, bilateral: Secondary | ICD-10-CM | POA: Diagnosis not present

## 2017-06-27 DIAGNOSIS — H524 Presbyopia: Secondary | ICD-10-CM | POA: Diagnosis not present

## 2017-06-27 DIAGNOSIS — H52223 Regular astigmatism, bilateral: Secondary | ICD-10-CM | POA: Diagnosis not present

## 2017-06-30 ENCOUNTER — Other Ambulatory Visit: Payer: 59

## 2017-06-30 ENCOUNTER — Other Ambulatory Visit: Payer: Self-pay | Admitting: Gynecology

## 2017-06-30 DIAGNOSIS — Z01411 Encounter for gynecological examination (general) (routine) with abnormal findings: Secondary | ICD-10-CM

## 2017-06-30 DIAGNOSIS — R7309 Other abnormal glucose: Secondary | ICD-10-CM

## 2017-06-30 DIAGNOSIS — Z13220 Encounter for screening for lipoid disorders: Secondary | ICD-10-CM

## 2017-06-30 DIAGNOSIS — M810 Age-related osteoporosis without current pathological fracture: Secondary | ICD-10-CM

## 2017-06-30 DIAGNOSIS — E7800 Pure hypercholesterolemia, unspecified: Secondary | ICD-10-CM

## 2017-07-01 LAB — COMPREHENSIVE METABOLIC PANEL
AG Ratio: 1.6 (calc) (ref 1.0–2.5)
ALT: 17 U/L (ref 6–29)
AST: 14 U/L (ref 10–35)
Albumin: 4 g/dL (ref 3.6–5.1)
Alkaline phosphatase (APISO): 62 U/L (ref 33–130)
BUN: 12 mg/dL (ref 7–25)
CO2: 30 mmol/L (ref 20–32)
Calcium: 8.8 mg/dL (ref 8.6–10.4)
Chloride: 105 mmol/L (ref 98–110)
Creat: 0.76 mg/dL (ref 0.50–1.05)
Globulin: 2.5 g/dL (calc) (ref 1.9–3.7)
Glucose, Bld: 108 mg/dL — ABNORMAL HIGH (ref 65–99)
Potassium: 4.2 mmol/L (ref 3.5–5.3)
Sodium: 143 mmol/L (ref 135–146)
Total Bilirubin: 0.4 mg/dL (ref 0.2–1.2)
Total Protein: 6.5 g/dL (ref 6.1–8.1)

## 2017-07-01 LAB — LIPID PANEL
Cholesterol: 182 mg/dL (ref <200)
HDL: 43 mg/dL — ABNORMAL LOW (ref >50)
LDL Cholesterol (Calc): 114 mg/dL (calc) — ABNORMAL HIGH
Non-HDL Cholesterol (Calc): 139 mg/dL (calc) — ABNORMAL HIGH (ref <130)
Total CHOL/HDL Ratio: 4.2 (calc) (ref <5.0)
Triglycerides: 142 mg/dL (ref <150)

## 2017-07-01 LAB — CBC WITH DIFFERENTIAL/PLATELET
Basophils Absolute: 33 cells/uL (ref 0–200)
Basophils Relative: 0.5 %
Eosinophils Absolute: 132 cells/uL (ref 15–500)
Eosinophils Relative: 2 %
HCT: 38.3 % (ref 35.0–45.0)
Hemoglobin: 13.2 g/dL (ref 11.7–15.5)
Lymphs Abs: 2765 cells/uL (ref 850–3900)
MCH: 32.1 pg (ref 27.0–33.0)
MCHC: 34.5 g/dL (ref 32.0–36.0)
MCV: 93.2 fL (ref 80.0–100.0)
MPV: 9.2 fL (ref 7.5–12.5)
Monocytes Relative: 6 %
Neutro Abs: 3274 cells/uL (ref 1500–7800)
Neutrophils Relative %: 49.6 %
Platelets: 277 10*3/uL (ref 140–400)
RBC: 4.11 10*6/uL (ref 3.80–5.10)
RDW: 11.7 % (ref 11.0–15.0)
Total Lymphocyte: 41.9 %
WBC mixed population: 396 cells/uL (ref 200–950)
WBC: 6.6 10*3/uL (ref 3.8–10.8)

## 2017-07-01 LAB — TSH: TSH: 2.16 mIU/L

## 2017-07-01 LAB — VITAMIN D 25 HYDROXY (VIT D DEFICIENCY, FRACTURES): Vit D, 25-Hydroxy: 35 ng/mL (ref 30–100)

## 2017-07-02 ENCOUNTER — Other Ambulatory Visit: Payer: Self-pay | Admitting: Gynecology

## 2017-07-02 DIAGNOSIS — E559 Vitamin D deficiency, unspecified: Secondary | ICD-10-CM

## 2017-07-02 DIAGNOSIS — R7309 Other abnormal glucose: Secondary | ICD-10-CM

## 2018-03-14 DIAGNOSIS — Z23 Encounter for immunization: Secondary | ICD-10-CM | POA: Diagnosis not present

## 2018-04-29 ENCOUNTER — Other Ambulatory Visit: Payer: Self-pay | Admitting: Gynecology

## 2018-04-29 DIAGNOSIS — Z1231 Encounter for screening mammogram for malignant neoplasm of breast: Secondary | ICD-10-CM

## 2018-06-08 ENCOUNTER — Ambulatory Visit
Admission: RE | Admit: 2018-06-08 | Discharge: 2018-06-08 | Disposition: A | Discharge status: Home / Self Care | Payer: 59 | Source: Ambulatory Visit | Attending: Gynecology | Admitting: Gynecology

## 2018-06-08 ENCOUNTER — Ambulatory Visit: Payer: 59

## 2018-06-08 DIAGNOSIS — Z1231 Encounter for screening mammogram for malignant neoplasm of breast: Secondary | ICD-10-CM | POA: Diagnosis not present

## 2018-06-10 ENCOUNTER — Encounter: Payer: Self-pay | Admitting: Gynecology

## 2018-06-10 ENCOUNTER — Ambulatory Visit (INDEPENDENT_AMBULATORY_CARE_PROVIDER_SITE_OTHER): Payer: 59 | Admitting: Gynecology

## 2018-06-10 VITALS — BP 118/76 | Ht 67.5 in | Wt 193.0 lb

## 2018-06-10 DIAGNOSIS — M810 Age-related osteoporosis without current pathological fracture: Secondary | ICD-10-CM | POA: Diagnosis not present

## 2018-06-10 DIAGNOSIS — Z13220 Encounter for screening for lipoid disorders: Secondary | ICD-10-CM | POA: Diagnosis not present

## 2018-06-10 DIAGNOSIS — Z01419 Encounter for gynecological examination (general) (routine) without abnormal findings: Secondary | ICD-10-CM | POA: Diagnosis not present

## 2018-06-10 MED ORDER — ALENDRONATE SODIUM 70 MG PO TABS: ORAL_TABLET | ORAL | 4 refills | Status: DC

## 2018-06-10 NOTE — Patient Instructions (Signed)
Follow up in one year for annual exam 

## 2018-06-10 NOTE — Progress Notes (Signed)
    Jennifer Velez 1966-04-05 [id number]        52 y.o.  G1P0010 for annual gynecologic exam.  Doing well without complaints  Past medical history,surgical history, problem list, medications, allergies, family history and social history were all reviewed and documented as reviewed in the EPIC chart.  ROS:  Performed with pertinent positives and negatives included in the history, assessment and plan.   Additional significant findings : None   Exam: Caryn Bee assistant Vitals:   06/10/18 0905  BP: 118/76  Weight: 193 lb (87.5 kg)  Height: 5' 7.5" (1.715 m)   Body mass index is 29.78 kg/m.  General appearance:  Normal affect, orientation and appearance. Skin: Grossly normal HEENT: Without gross lesions.  No cervical or supraclavicular adenopathy. Thyroid normal.  Lungs:  Clear without wheezing, rales or rhonchi Cardiac: RR, without RMG Abdominal:  Soft, nontender, without masses, guarding, rebound, organomegaly or hernia Breasts:  Examined lying and sitting without masses, retractions, discharge or axillary adenopathy. Pelvic:  Ext, BUS, Vagina: Normal with atrophic changes  Cervix: Normal with atrophic changes.  Pap smear done  Uterus: Anteverted, normal size, shape and contour, midline and mobile nontender   Adnexa: Without masses or tenderness    Anus and perineum: Normal   Rectovaginal: Normal sphincter tone without palpated masses or tenderness.    Assessment/Plan:  52 y.o. G2P0010 female for annual gynecologic exam.   1. Postmenopausal.  No significant menopausal symptoms or any bleeding. 2. Osteoporosis.  DEXA 2018 T score -2.0.  Had been -2.8.  Currently on alendronate for 3 years.  Doing well without side effects.  Refill x1 year provided.  Plan repeat DEXA next year at 2-year interval.  Check vitamin D level today. 3. Mammography 05/2018.  Continue with annual mammography next year.  Breast exam normal today. 4. Pap smear 2016.  Pap smear done today.  History of  LEEP for CIN-3 1999 with normal Pap smears since. 5. Colonoscopy 2016.  Repeat at their recommended interval. 6. Health maintenance.  Baseline CBC, CMP, lipid profile and vitamin D ordered.  TSH normal last year.  Follow-up 1 year, sooner as needed.   Anastasio Auerbach MD, 9:46 AM 06/10/2018

## 2018-06-10 NOTE — Addendum Note (Signed)
Addended by: Nelva Nay on: 06/10/2018 10:19 AM   Modules accepted: Orders

## 2018-06-11 ENCOUNTER — Encounter: Payer: Self-pay | Admitting: Gynecology

## 2018-06-11 LAB — COMPREHENSIVE METABOLIC PANEL
AG Ratio: 1.4 (calc) (ref 1.0–2.5)
ALT: 19 U/L (ref 6–29)
AST: 15 U/L (ref 10–35)
Albumin: 4.1 g/dL (ref 3.6–5.1)
Alkaline phosphatase (APISO): 60 U/L (ref 33–130)
BUN: 14 mg/dL (ref 7–25)
CO2: 32 mmol/L (ref 20–32)
Calcium: 8.8 mg/dL (ref 8.6–10.4)
Chloride: 103 mmol/L (ref 98–110)
Creat: 0.79 mg/dL (ref 0.50–1.05)
Globulin: 2.9 g/dL (calc) (ref 1.9–3.7)
Glucose, Bld: 101 mg/dL — ABNORMAL HIGH (ref 65–99)
Potassium: 4 mmol/L (ref 3.5–5.3)
Sodium: 140 mmol/L (ref 135–146)
Total Bilirubin: 0.4 mg/dL (ref 0.2–1.2)
Total Protein: 7 g/dL (ref 6.1–8.1)

## 2018-06-11 LAB — CBC WITH DIFFERENTIAL/PLATELET
Basophils Absolute: 51 cells/uL (ref 0–200)
Basophils Relative: 0.9 %
Eosinophils Absolute: 91 cells/uL (ref 15–500)
Eosinophils Relative: 1.6 %
HCT: 40.7 % (ref 35.0–45.0)
Hemoglobin: 14.4 g/dL (ref 11.7–15.5)
Lymphs Abs: 2063 cells/uL (ref 850–3900)
MCH: 34.1 pg — ABNORMAL HIGH (ref 27.0–33.0)
MCHC: 35.4 g/dL (ref 32.0–36.0)
MCV: 96.4 fL (ref 80.0–100.0)
MPV: 9.5 fL (ref 7.5–12.5)
Monocytes Relative: 6.1 %
Neutro Abs: 3146 cells/uL (ref 1500–7800)
Neutrophils Relative %: 55.2 %
Platelets: 258 10*3/uL (ref 140–400)
RBC: 4.22 10*6/uL (ref 3.80–5.10)
RDW: 11.8 % (ref 11.0–15.0)
Total Lymphocyte: 36.2 %
WBC mixed population: 348 cells/uL (ref 200–950)
WBC: 5.7 10*3/uL (ref 3.8–10.8)

## 2018-06-11 LAB — PAP IG W/ RFLX HPV ASCU

## 2018-06-11 LAB — LIPID PANEL
Cholesterol: 178 mg/dL (ref <200)
HDL: 42 mg/dL — ABNORMAL LOW (ref >50)
LDL Cholesterol (Calc): 116 mg/dL (calc) — ABNORMAL HIGH
Non-HDL Cholesterol (Calc): 136 mg/dL (calc) — ABNORMAL HIGH (ref <130)
Total CHOL/HDL Ratio: 4.2 (calc) (ref <5.0)
Triglycerides: 98 mg/dL (ref <150)

## 2018-06-11 LAB — VITAMIN D 25 HYDROXY (VIT D DEFICIENCY, FRACTURES): Vit D, 25-Hydroxy: 47 ng/mL (ref 30–100)

## 2018-07-10 DIAGNOSIS — L814 Other melanin hyperpigmentation: Secondary | ICD-10-CM | POA: Diagnosis not present

## 2018-07-10 DIAGNOSIS — D0461 Carcinoma in situ of skin of right upper limb, including shoulder: Secondary | ICD-10-CM | POA: Diagnosis not present

## 2018-07-10 DIAGNOSIS — L821 Other seborrheic keratosis: Secondary | ICD-10-CM | POA: Diagnosis not present

## 2018-07-10 DIAGNOSIS — L570 Actinic keratosis: Secondary | ICD-10-CM | POA: Diagnosis not present

## 2018-07-10 DIAGNOSIS — D2261 Melanocytic nevi of right upper limb, including shoulder: Secondary | ICD-10-CM | POA: Diagnosis not present

## 2018-07-30 DIAGNOSIS — C44622 Squamous cell carcinoma of skin of right upper limb, including shoulder: Secondary | ICD-10-CM | POA: Diagnosis not present

## 2018-07-30 DIAGNOSIS — D0462 Carcinoma in situ of skin of left upper limb, including shoulder: Secondary | ICD-10-CM | POA: Diagnosis not present

## 2019-01-05 ENCOUNTER — Other Ambulatory Visit: Payer: Self-pay | Admitting: Gynecology

## 2019-01-05 DIAGNOSIS — Z1231 Encounter for screening mammogram for malignant neoplasm of breast: Secondary | ICD-10-CM

## 2019-03-24 ENCOUNTER — Encounter: Payer: Self-pay | Admitting: Gynecology

## 2019-06-10 ENCOUNTER — Other Ambulatory Visit: Payer: Self-pay

## 2019-06-10 ENCOUNTER — Ambulatory Visit
Admission: RE | Admit: 2019-06-10 | Discharge: 2019-06-10 | Disposition: A | Discharge status: Home / Self Care | Payer: 59 | Source: Ambulatory Visit | Attending: Gynecology | Admitting: Gynecology

## 2019-06-10 DIAGNOSIS — Z1231 Encounter for screening mammogram for malignant neoplasm of breast: Secondary | ICD-10-CM

## 2019-06-17 ENCOUNTER — Encounter: Payer: Self-pay | Admitting: Gynecology

## 2019-06-17 ENCOUNTER — Other Ambulatory Visit: Payer: Self-pay

## 2019-06-17 ENCOUNTER — Ambulatory Visit (INDEPENDENT_AMBULATORY_CARE_PROVIDER_SITE_OTHER): Payer: 59 | Admitting: Gynecology

## 2019-06-17 VITALS — BP 118/76 | Ht 67.5 in | Wt 191.0 lb

## 2019-06-17 DIAGNOSIS — Z01419 Encounter for gynecological examination (general) (routine) without abnormal findings: Secondary | ICD-10-CM

## 2019-06-17 DIAGNOSIS — N952 Postmenopausal atrophic vaginitis: Secondary | ICD-10-CM

## 2019-06-17 DIAGNOSIS — M810 Age-related osteoporosis without current pathological fracture: Secondary | ICD-10-CM | POA: Diagnosis not present

## 2019-06-17 DIAGNOSIS — Z13220 Encounter for screening for lipoid disorders: Secondary | ICD-10-CM | POA: Diagnosis not present

## 2019-06-17 MED ORDER — ALENDRONATE SODIUM 70 MG PO TABS: ORAL_TABLET | ORAL | 4 refills | Status: DC

## 2019-06-17 NOTE — Progress Notes (Signed)
    Jennifer Velez January 29, 1966 [id number]        53 y.o.  G1P0010 for annual gynecologic exam.  Without gynecologic complaints  Past medical history,surgical history, problem list, medications, allergies, family history and social history were all reviewed and documented as reviewed in the EPIC chart.  ROS:  Performed with pertinent positives and negatives included in the history, assessment and plan.   Additional significant findings : None   Exam: Caryn Bee assistant Vitals:   06/17/19 1117  BP: 118/76  Weight: 191 lb (86.6 kg)  Height: 5' 7.5" (1.715 m)   Body mass index is 29.47 kg/m.  General appearance:  Normal affect, orientation and appearance. Skin: Grossly normal HEENT: Without gross lesions.  No cervical or supraclavicular adenopathy. Thyroid normal.  Lungs:  Clear without wheezing, rales or rhonchi Cardiac: RR, without RMG Abdominal:  Soft, nontender, without masses, guarding, rebound, organomegaly or hernia Breasts:  Examined lying and sitting without masses, retractions, discharge or axillary adenopathy. Pelvic:  Ext, BUS, Vagina: With mild atrophic changes  Cervix: With mild atrophic changes  Uterus: Anteverted, normal size, shape and contour, midline and mobile nontender   Adnexa: Without masses or tenderness    Anus and perineum: Normal   Rectovaginal: Normal sphincter tone without palpated masses or tenderness.    Assessment/Plan:  53 y.o. G13P0010 female for annual gynecologic exam.   1. Postmenopausal.  No significant menopausal symptoms or any bleeding. 2. Pap smear 05/2018.  No Pap smear done today.  History of LEEP for CIN-3 1990 with normal Pap smears since.  Plan repeat Pap smear at 3-year interval per current screening guidelines. 3. Colonoscopy 2016.  Repeat at their recommended interval. 4. Mammography due now and patient will follow-up for this.  Breast exam normal today. 5. Osteoporosis.  DEXA 2018 T score -2.0.  Previously -2.8.  On  alendronate for approximately 4 years.  We will continue on alendronate for now.  Refill x1 year provided.  Repeat DEXA now at 2-year interval and she will schedule.  Discussed possible drug-free holiday at 5 years.  Check TSH and vitamin D 6. Health maintenance.  Requests baseline labs.  CBC, CMP, lipid profile, vitamin D and TSH ordered.  Follow-up 1 year, sooner as needed.   Anastasio Auerbach MD, 12:35 PM 06/17/2019

## 2019-06-17 NOTE — Patient Instructions (Signed)
Follow-up for the bone density as scheduled.  Follow-up in 1 year for annual exam.

## 2019-06-18 ENCOUNTER — Encounter: Payer: Self-pay | Admitting: Gynecology

## 2019-06-18 LAB — VITAMIN D 25 HYDROXY (VIT D DEFICIENCY, FRACTURES): Vit D, 25-Hydroxy: 51 ng/mL (ref 30–100)

## 2019-06-18 LAB — CBC WITH DIFFERENTIAL/PLATELET
Absolute Monocytes: 406 cells/uL (ref 200–950)
Basophils Absolute: 42 cells/uL (ref 0–200)
Basophils Relative: 0.6 %
Eosinophils Absolute: 70 cells/uL (ref 15–500)
Eosinophils Relative: 1 %
HCT: 42.3 % (ref 35.0–45.0)
Hemoglobin: 14.8 g/dL (ref 11.7–15.5)
Lymphs Abs: 2849 cells/uL (ref 850–3900)
MCH: 33.9 pg — ABNORMAL HIGH (ref 27.0–33.0)
MCHC: 35 g/dL (ref 32.0–36.0)
MCV: 97 fL (ref 80.0–100.0)
MPV: 9.4 fL (ref 7.5–12.5)
Monocytes Relative: 5.8 %
Neutro Abs: 3633 cells/uL (ref 1500–7800)
Neutrophils Relative %: 51.9 %
Platelets: 281 10*3/uL (ref 140–400)
RBC: 4.36 10*6/uL (ref 3.80–5.10)
RDW: 11.9 % (ref 11.0–15.0)
Total Lymphocyte: 40.7 %
WBC: 7 10*3/uL (ref 3.8–10.8)

## 2019-06-18 LAB — COMPREHENSIVE METABOLIC PANEL
AG Ratio: 1.7 (calc) (ref 1.0–2.5)
ALT: 15 U/L (ref 6–29)
AST: 15 U/L (ref 10–35)
Albumin: 4.3 g/dL (ref 3.6–5.1)
Alkaline phosphatase (APISO): 54 U/L (ref 37–153)
BUN: 16 mg/dL (ref 7–25)
CO2: 24 mmol/L (ref 20–32)
Calcium: 8.9 mg/dL (ref 8.6–10.4)
Chloride: 103 mmol/L (ref 98–110)
Creat: 0.78 mg/dL (ref 0.50–1.05)
Globulin: 2.6 g/dL (calc) (ref 1.9–3.7)
Glucose, Bld: 93 mg/dL (ref 65–99)
Potassium: 3.9 mmol/L (ref 3.5–5.3)
Sodium: 141 mmol/L (ref 135–146)
Total Bilirubin: 0.5 mg/dL (ref 0.2–1.2)
Total Protein: 6.9 g/dL (ref 6.1–8.1)

## 2019-06-18 LAB — LIPID PANEL
Cholesterol: 210 mg/dL — ABNORMAL HIGH (ref <200)
HDL: 41 mg/dL — ABNORMAL LOW (ref >=50)
LDL Cholesterol (Calc): 140 mg/dL (calc) — ABNORMAL HIGH
Non-HDL Cholesterol (Calc): 169 mg/dL (calc) — ABNORMAL HIGH (ref <130)
Total CHOL/HDL Ratio: 5.1 (calc) — ABNORMAL HIGH (ref <5.0)
Triglycerides: 154 mg/dL — ABNORMAL HIGH (ref <150)

## 2019-06-18 LAB — TSH: TSH: 1.71 mIU/L

## 2019-07-05 ENCOUNTER — Other Ambulatory Visit: Payer: Self-pay | Admitting: Obstetrics and Gynecology

## 2019-07-05 DIAGNOSIS — E7800 Pure hypercholesterolemia, unspecified: Secondary | ICD-10-CM

## 2019-07-05 NOTE — Telephone Encounter (Signed)
Spoke with patient and informed her. Order and recall placed.

## 2019-07-12 ENCOUNTER — Other Ambulatory Visit: Payer: Self-pay

## 2019-07-13 ENCOUNTER — Other Ambulatory Visit: Payer: Self-pay | Admitting: Gynecology

## 2019-07-13 ENCOUNTER — Ambulatory Visit (INDEPENDENT_AMBULATORY_CARE_PROVIDER_SITE_OTHER): Payer: 59

## 2019-07-13 DIAGNOSIS — Z780 Asymptomatic menopausal state: Secondary | ICD-10-CM

## 2019-07-13 DIAGNOSIS — M810 Age-related osteoporosis without current pathological fracture: Secondary | ICD-10-CM

## 2019-08-10 ENCOUNTER — Other Ambulatory Visit: Payer: 59

## 2019-08-12 ENCOUNTER — Other Ambulatory Visit: Payer: 59

## 2019-09-22 ENCOUNTER — Ambulatory Visit (INDEPENDENT_AMBULATORY_CARE_PROVIDER_SITE_OTHER)
Admission: RE | Admit: 2019-09-22 | Discharge: 2019-09-22 | Disposition: A | Discharge status: Home / Self Care | Payer: 59 | Source: Ambulatory Visit | Attending: Family Medicine | Admitting: Family Medicine

## 2019-09-22 ENCOUNTER — Encounter: Payer: Self-pay | Admitting: Family Medicine

## 2019-09-22 ENCOUNTER — Ambulatory Visit: Payer: 59 | Admitting: Family Medicine

## 2019-09-22 ENCOUNTER — Other Ambulatory Visit: Payer: Self-pay

## 2019-09-22 VITALS — BP 110/78 | HR 72 | Temp 98.0°F | Ht 67.5 in | Wt 193.2 lb

## 2019-09-22 DIAGNOSIS — M25511 Pain in right shoulder: Secondary | ICD-10-CM

## 2019-09-22 DIAGNOSIS — M7501 Adhesive capsulitis of right shoulder: Secondary | ICD-10-CM | POA: Diagnosis not present

## 2019-09-22 DIAGNOSIS — M549 Dorsalgia, unspecified: Secondary | ICD-10-CM | POA: Diagnosis not present

## 2019-09-22 MED ORDER — METHYLPREDNISOLONE ACETATE 40 MG/ML IJ SUSP
80.0000 mg | Freq: Once | INTRAMUSCULAR | Status: AC
  Administered 2019-09-22: 80 mg via INTRA_ARTICULAR

## 2019-09-22 NOTE — Progress Notes (Signed)
Jennifer Salomon T. Jennifer Banghart, MD Primary [facility] and [place] [facility] [name], [address] Phone: [phone]  FAX: [phone]  Jennifer Velez - 54 y.o. female  MRN [id number]  Date of Birth: 1965/07/15  Visit Date: 09/22/2019  PCP: Owens Loffler, MD  Referred by: Owens Loffler, MD  Chief Complaint  Patient presents with  . Shoulder Pain    Right  . Back Pain    Low-Burning sensation    This visit occurred during the SARS-CoV-2 public health emergency.  Safety protocols were in place, including screening questions prior to the visit, additional usage of staff PPE, and extensive cleaning of exam room while observing appropriate contact time as indicated for disinfecting solutions.   Subjective:   Jennifer Velez is a 54 y.o. very pleasant female patient with Body mass index is 29.82 kg/m. who presents with the following:  Dr. Phineas Real as her GYN.    The computer recognizes me as the primary care doctor, but I have not evaluated this patient or seen her in the office in 8 years. She presents with some ongoing right shoulder and lumbar spine pain.  Started hurting her with lifting up.  More now at night.  Can sleep on it.  Hurts with movement.  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  Osteopenia on DEXA.Marland Kitchen #'s increased some.  Has a lot of pain in her Only in her back.  Feels like it is on fire.  Overdid it.   She presents with approximately 4 weeks of some ongoing shoulder pain with some restriction of motion and directions.  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  She is not having any numbness or tingling.  Strength is preserved.  She is also having some low back pain without radiculopathy.  There is some tingling sensation isolated to the back but not moving.  Review of Systems is noted in the HPI, as appropriate   Objective:   BP 110/78   Pulse 72   Temp  98 F (36.7 C) (Temporal)   Ht 5' 7.5" (1.715 m)   Wt 193 lb 4 oz (87.7 kg)   LMP 11/02/2012   SpO2 96%   BMI 29.82 kg/m    GEN: No acute distress; alert,appropriate. PULM: Breathing comfortably in no respiratory distress PSYCH: Normally interactive.   Shoulder: R and L Inspection: No muscle wasting or winging Ecchymosis/edema: neg  AC joint, scapula, clavicle: NT Cervical spine: NT, full ROM Spurling's: neg ABNORMAL SIDE TESTED: Right UNLESS OTHERWISE NOTED, THE CONTRALATERAL SIDE HAS FULL RANGE OF MOTION. Abduction: 5/5, LIMITED TO 160 DEGREES Flexion: 5/5, LIMITED TO 155 DEGNO ROM  IR, lift-off: 5/5. TESTED AT 90 DEGREES OF ABDUCTION, LIMITED TO 5 DEGREES ER at neutral:  5/5, TESTED AT 90 DEGREES OF ABDUCTION, LIMITED TO 60 DEGREES AC crossover and compression: PAIN Drop Test: neg Empty Can: neg Supraspinatus insertion: NT Bicipital groove: NT ALL OTHER SPECIAL TESTING EQUIVOCAL GIVEN LOSS OF MOTION C5-T1 intact Sensation intact Grip 5/5    Range of motion at  the waist: Flexion: normal Extension: normal Lateral bending: normal Rotation: all normal  No echymosis or edema Rises to examination table with no difficulty Gait: non antalgic  Inspection/Deformity: N Paraspinus Tenderness: None  B Ankle Dorsiflexion (L5,4): 5/5 B Great Toe Dorsiflexion (L5,4): 5/5 Heel Walk (L5): WNL Toe Walk (S1): WNL Rise/Squat (L4): WNL  SENSORY B Medial Foot (L4): WNL B Dorsum (L5): WNL B Lateral (S1): WNL Light Touch: WNL Pinprick: WNL  REFLEXES Knee (L4): 2+ Ankle (S1): 2+  B SLR, seated: neg B SLR, supine: neg B FABER: neg B Reverse FABER: neg B Greater Troch: NT B Log Roll: neg B Stork: NT B Sciatic Notch: NT   Radiology: No results found.  Assessment and Plan:     ICD-10-CM   1. Adhesive capsulitis of right shoulder  M75.01 methylPREDNISolone acetate (DEPO-MEDROL) injection 80 mg  2. Acute back pain, unspecified back location, unspecified back pain  laterality  M54.9 DG Lumbar Spine Complete  3. Acute pain of right shoulder  M25.511    Patient was given a systematic ROM protocol from [employer] to be done daily. Emphasized adherence and recommended formal PT.  Tylenol or NSAID of choice prn for pain relief  Patient will be sent for formal PT for aggressive frozen shoulder ROM. Will need RTC str and scapular stabilization to fix underlying mechanics.  Intraarticular corticosteroid injections in Adhesive Capsulitis have clearly been shown to be of benefit.   Check back film.  Osteopenia.  Almost certainly degenerative disc disease with arthropathy.  Intraarticular Shoulder Aspiration/Injection Procedure Note Haeden Miyagi 09-06-1965 Date of procedure: 09/22/2019  Procedure: Large Joint Aspiration / Injection of Shoulder, Intraarticular Indications: Pain  Procedure Details Verbal consent was obtained from the patient. Risks including infection explained and contrasted with benefits and alternatives. Patient prepped with Betadine and Ethyl Chloride used for anesthesia. An intraarticular shoulder injection was performed using the posterior approach; needle placed into joint capsule without difficulty. The patient tolerated the procedure well and had decreased pain post injection. No complications. Injection: 8 cc of Lidocaine 1% and 2 mL Depo-Medrol 40 mg. Needle: 21 gauge, 2 inch   Follow-up: Return in about 6 weeks (around 11/03/2019).  Meds ordered this encounter  Medications  . methylPREDNISolone acetate (DEPO-MEDROL) injection 80 mg   There are no discontinued medications. Orders Placed This Encounter  Procedures  . DG Lumbar Spine Complete    Signed,  Sirr Kabel T. Tarrie Mcmichen, MD   Outpatient Encounter Medications as of 09/22/2019  Medication Sig  . Ascorbic Acid (VITAMIN C PO) Take 1,000 mg by mouth in the morning and at bedtime.   . IBUPROFEN PO Take 600 mg by mouth as needed.   Marland Kitchen MAGNESIUM PO Take 500 mg by mouth at  bedtime.   Marland Kitchen VITAMIN D, ERGOCALCIFEROL, PO Take 2,000 Units by mouth in the morning.   Marland Kitchen alendronate (FOSAMAX) 70 MG tablet TAKE 1 TABLET BY MOUTH EVERY 7 DAYS. TAKE WITH A FULL GLASS OF WATER ON AN EMPTY STOMACH. (Patient not taking: Reported on 09/22/2019)  . [EXPIRED] methylPREDNISolone acetate (DEPO-MEDROL) injection 80 mg    No facility-administered encounter medications on file as of 09/22/2019.

## 2019-09-22 NOTE — Patient Instructions (Signed)
Motrin 600 - 800 mg recommended TID. (Over the counter Motrin, Advil, or Generic Ibuprofen 200 mg tablets. 3-4 tablets by mouth 3 times a day. This equals a prescription strength dose.)   OR   Alleve 2 tabs by mouth two times a day over the counter: Take at least for 2 - 3 weeks. This is equal to a prescripton strength dose (GENERIC CHEAPER EQUIVALENT IS NAPROXEN SODIUM)

## 2019-11-03 ENCOUNTER — Encounter: Payer: Self-pay | Admitting: Family Medicine

## 2019-11-03 ENCOUNTER — Ambulatory Visit: Payer: 59 | Admitting: Family Medicine

## 2019-11-03 ENCOUNTER — Other Ambulatory Visit: Payer: Self-pay

## 2019-11-03 VITALS — BP 100/62 | HR 70 | Temp 98.0°F | Ht 67.5 in | Wt 185.2 lb

## 2019-11-03 DIAGNOSIS — M549 Dorsalgia, unspecified: Secondary | ICD-10-CM

## 2019-11-03 DIAGNOSIS — M7501 Adhesive capsulitis of right shoulder: Secondary | ICD-10-CM | POA: Diagnosis not present

## 2019-11-03 DIAGNOSIS — G8929 Other chronic pain: Secondary | ICD-10-CM

## 2019-11-03 NOTE — Patient Instructions (Signed)
Stop FOSAMAX  Calcium 1,000 mg daily Vit D, 2,000 units daily

## 2019-11-03 NOTE — Progress Notes (Signed)
Jennifer Velez T. Jennifer Pal, MD, [name] at [facility] [place], [address]  Phone: [phone]  FAX: [phone]  Jennifer Velez - 54 y.o. female  MRN [id number]  Date of Birth: [birth year]  Date: 11/03/2019  PCP: Owens Loffler, MD  Referral: Owens Loffler, MD  Chief Complaint  Patient presents with  . Follow-up    Right Shoulder    This visit occurred during the SARS-CoV-2 public health emergency.  Safety protocols were in place, including screening questions prior to the visit, additional usage of staff PPE, and extensive cleaning of exam room while observing appropriate contact time as indicated for disinfecting solutions.   Subjective:   Jennifer Velez is a 54 y.o. very pleasant female patient with Body mass index is 28.59 kg/m. who presents with the following:  F/u R frozen shoulder: ROM is better, pain is better but not fully recovered.  She has done remarkably well.  Last time I saw her was on 24, 2021, and I did inject her shoulder with some Depo-Medrol.  Also had her work on some range of motion from [employer] protocol.  She still does lack some terminal internal range of motion, but globally she is much better.  Stress with her Mom - not doing well. Str is not back.  Chronic low back.  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  She does have some ongoing intermittent back pain without radicular symptoms and no neurovascular changes.  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  Review of Systems is noted in the HPI, as appropriate   Objective:   BP 100/62   Pulse 70   Temp 98 F (36.7 C) (Temporal)   Ht 5' 7.5" (1.715 m)   Wt 185 lb 4 oz (84 kg)   LMP 11/02/2012   SpO2 95%   BMI 28.59 kg/m    GEN: No acute distress; alert,appropriate. PULM: Breathing comfortably in no respiratory distress PSYCH: Normally interactive.    Shoulder: R and L Inspection: No muscle wasting or winging Ecchymosis/edema: neg  AC joint, scapula, clavicle: NT Cervical spine: NT, full ROM Spurling's: neg ABNORMAL SIDE TESTED: Right UNLESS OTHERWISE NOTED, THE CONTRALATERAL SIDE HAS FULL RANGE OF MOTION. Abduction: 5/5, LIMITED TO full DEGREES Flexion: 5/5, LIMITED TO FULL DEGNO ROM  IR, lift-off: 5/5. TESTED AT 90 DEGREES OF ABDUCTION, LIMITED TO 25 degrees decreased compared to the contralateral side DEGREES ER at neutral:  5/5, TESTED AT 90 DEGREES OF ABDUCTION, LIMITED TO 10 degrees less than the contralateral side DEGREES AC crossover and compression: PAIN Drop Test: neg Empty Can: neg Supraspinatus insertion: NT Bicipital groove: NT ALL OTHER SPECIAL TESTING EQUIVOCAL GIVEN LOSS OF MOTION C5-T1 intact Sensation intact Grip 5/5   Radiology: No results found.  Assessment and Plan:     ICD-10-CM   1. Adhesive capsulitis of right shoulder  M75.01   2. Chronic back pain greater than 3 months duration  M54.9    G89.29    Adhesive capsulitis is done remarkably well.  I expect her internal range of motion and external range of motion will also continue to improve.  She does have intermittent ongoing back pain and she is doing quite a bit of farm work.  Think doing some basic range of motion, maintaining an appropriate weight would be quite helpful.  Recommended some weight loss with a BMI of  29.  She does not need to continue her Fosamax given that she has osteopenia.  Follow-up: Return in about 10 years (around 11/02/2029).  No orders of the defined types were placed in this encounter.  Medications Discontinued During This Encounter  Medication Reason  . alendronate (FOSAMAX) 70 MG tablet Patient Preference   No orders of the defined types were placed in this encounter.   Signed,  Maud Deed. Jatoya Armbrister, MD   Outpatient Encounter Medications as of 11/03/2019  Medication Sig  . Ascorbic Acid (VITAMIN C PO) Take  1,000 mg by mouth in the morning and at bedtime.   . IBUPROFEN PO Take 600 mg by mouth as needed.   Marland Kitchen MAGNESIUM PO Take 500 mg by mouth at bedtime.   Marland Kitchen VITAMIN D, ERGOCALCIFEROL, PO Take 2,000 Units by mouth in the morning.   . [DISCONTINUED] alendronate (FOSAMAX) 70 MG tablet TAKE 1 TABLET BY MOUTH EVERY 7 DAYS. TAKE WITH A FULL GLASS OF WATER ON AN EMPTY STOMACH. (Patient not taking: Reported on 09/22/2019)   No facility-administered encounter medications on file as of 11/03/2019.

## 2020-04-13 ENCOUNTER — Other Ambulatory Visit: Payer: Self-pay | Admitting: Obstetrics and Gynecology

## 2020-04-13 DIAGNOSIS — Z1231 Encounter for screening mammogram for malignant neoplasm of breast: Secondary | ICD-10-CM

## 2020-05-15 ENCOUNTER — Other Ambulatory Visit: Payer: Self-pay | Admitting: Obstetrics and Gynecology

## 2020-05-15 ENCOUNTER — Other Ambulatory Visit: Payer: Self-pay | Admitting: Gynecology

## 2020-05-15 ENCOUNTER — Other Ambulatory Visit: Payer: 59

## 2020-05-15 ENCOUNTER — Other Ambulatory Visit: Payer: Self-pay

## 2020-05-15 DIAGNOSIS — Z01419 Encounter for gynecological examination (general) (routine) without abnormal findings: Secondary | ICD-10-CM

## 2020-05-15 DIAGNOSIS — Z13220 Encounter for screening for lipoid disorders: Secondary | ICD-10-CM

## 2020-05-15 DIAGNOSIS — M810 Age-related osteoporosis without current pathological fracture: Secondary | ICD-10-CM

## 2020-05-15 DIAGNOSIS — E7800 Pure hypercholesterolemia, unspecified: Secondary | ICD-10-CM

## 2020-05-15 LAB — COMPREHENSIVE METABOLIC PANEL
AG Ratio: 1.6 (calc) (ref 1.0–2.5)
ALT: 14 U/L (ref 6–29)
AST: 15 U/L (ref 10–35)
Albumin: 4.2 g/dL (ref 3.6–5.1)
Alkaline phosphatase (APISO): 67 U/L (ref 37–153)
BUN: 16 mg/dL (ref 7–25)
CO2: 31 mmol/L (ref 20–32)
Calcium: 9.3 mg/dL (ref 8.6–10.4)
Chloride: 102 mmol/L (ref 98–110)
Creat: 0.71 mg/dL (ref 0.50–1.05)
Globulin: 2.7 g/dL (calc) (ref 1.9–3.7)
Glucose, Bld: 108 mg/dL — ABNORMAL HIGH (ref 65–99)
Potassium: 4.2 mmol/L (ref 3.5–5.3)
Sodium: 139 mmol/L (ref 135–146)
Total Bilirubin: 0.4 mg/dL (ref 0.2–1.2)
Total Protein: 6.9 g/dL (ref 6.1–8.1)

## 2020-05-15 LAB — CBC WITH DIFFERENTIAL/PLATELET
Absolute Monocytes: 320 cells/uL (ref 200–950)
Basophils Absolute: 30 cells/uL (ref 0–200)
Basophils Relative: 0.6 %
Eosinophils Absolute: 70 cells/uL (ref 15–500)
Eosinophils Relative: 1.4 %
HCT: 39.8 % (ref 35.0–45.0)
Hemoglobin: 14.3 g/dL (ref 11.7–15.5)
Lymphs Abs: 2230 cells/uL (ref 850–3900)
MCH: 34.6 pg — ABNORMAL HIGH (ref 27.0–33.0)
MCHC: 35.9 g/dL (ref 32.0–36.0)
MCV: 96.4 fL (ref 80.0–100.0)
MPV: 9.5 fL (ref 7.5–12.5)
Monocytes Relative: 6.4 %
Neutro Abs: 2350 cells/uL (ref 1500–7800)
Neutrophils Relative %: 47 %
Platelets: 247 10*3/uL (ref 140–400)
RBC: 4.13 10*6/uL (ref 3.80–5.10)
RDW: 12 % (ref 11.0–15.0)
Total Lymphocyte: 44.6 %
WBC: 5 10*3/uL (ref 3.8–10.8)

## 2020-05-15 LAB — LIPID PANEL
Cholesterol: 190 mg/dL (ref <200)
HDL: 42 mg/dL — ABNORMAL LOW (ref >=50)
LDL Cholesterol (Calc): 122 mg/dL (calc) — ABNORMAL HIGH
Non-HDL Cholesterol (Calc): 148 mg/dL (calc) — ABNORMAL HIGH (ref <130)
Total CHOL/HDL Ratio: 4.5 (calc) (ref <5.0)
Triglycerides: 149 mg/dL (ref <150)

## 2020-05-31 ENCOUNTER — Ambulatory Visit: Payer: 59

## 2020-06-05 NOTE — Telephone Encounter (Signed)
Called patient and per DPR access note on file I left detailed message in voice mail reading her Dr. Scarlette Ar result note.  I told her this was in her My Chart but had returned unread and she can read it there as well.  I provided my direct phone number in the event she has any questions.

## 2020-06-12 ENCOUNTER — Ambulatory Visit
Admission: RE | Admit: 2020-06-12 | Discharge: 2020-06-12 | Disposition: A | Discharge status: Home / Self Care | Payer: 59 | Source: Ambulatory Visit | Attending: Obstetrics and Gynecology | Admitting: Obstetrics and Gynecology

## 2020-06-12 ENCOUNTER — Other Ambulatory Visit: Payer: Self-pay

## 2020-06-12 ENCOUNTER — Other Ambulatory Visit: Payer: 59

## 2020-06-12 DIAGNOSIS — Z1231 Encounter for screening mammogram for malignant neoplasm of breast: Secondary | ICD-10-CM

## 2020-06-20 ENCOUNTER — Ambulatory Visit (INDEPENDENT_AMBULATORY_CARE_PROVIDER_SITE_OTHER): Payer: 59 | Admitting: Obstetrics and Gynecology

## 2020-06-20 ENCOUNTER — Encounter: Payer: Self-pay | Admitting: Obstetrics and Gynecology

## 2020-06-20 ENCOUNTER — Other Ambulatory Visit: Payer: Self-pay

## 2020-06-20 VITALS — BP 118/76 | Ht 67.5 in | Wt 184.0 lb

## 2020-06-20 DIAGNOSIS — Z1329 Encounter for screening for other suspected endocrine disorder: Secondary | ICD-10-CM

## 2020-06-20 DIAGNOSIS — Z01419 Encounter for gynecological examination (general) (routine) without abnormal findings: Secondary | ICD-10-CM

## 2020-06-20 DIAGNOSIS — M810 Age-related osteoporosis without current pathological fracture: Secondary | ICD-10-CM

## 2020-06-20 LAB — VITAMIN D 25 HYDROXY (VIT D DEFICIENCY, FRACTURES): Vit D, 25-Hydroxy: 39 ng/mL (ref 30–100)

## 2020-06-20 LAB — TSH: TSH: 1.33 mIU/L (ref 0.40–4.50)

## 2020-06-20 NOTE — Progress Notes (Addendum)
   Stefanee Mckell 08-16-1965 [id number]  SUBJECTIVE:  54 y.o. G1P0010 female here for a breast and pelvic exam. She has no gynecologic concerns.  Current Outpatient Medications  Medication Sig Dispense Refill  . Ascorbic Acid (VITAMIN C PO) Take 1,000 mg by mouth in the morning and at bedtime.     . IBUPROFEN PO Take 600 mg by mouth as needed.     Marland Kitchen MAGNESIUM PO Take 500 mg by mouth at bedtime.     Marland Kitchen VITAMIN D, ERGOCALCIFEROL, PO Take 2,000 Units by mouth in the morning.  (Patient not taking: Reported on 06/20/2020)     No current facility-administered medications for this visit.   Allergies: Sulfonamide derivatives  Patient's last menstrual period was 11/02/2012.  Past medical history,surgical history, problem list, medications, allergies, family history and social history were all reviewed and documented as reviewed in the EPIC chart.  GYN ROS: no abnormal bleeding, pelvic pain or discharge, no breast pain or new or enlarging lumps on self exam.  No dysuria, urinary frequency, pain with urination, cloudy/malodorous urine.   OBJECTIVE:  BP 118/76   Ht 5' 7.5" (1.715 m)   Wt 184 lb (83.5 kg)   LMP 11/02/2012   BMI 28.39 kg/m  The patient appears well, alert, oriented, in no distress.  BREAST EXAM: breasts appear normal, no suspicious masses, no skin or nipple changes or axillary nodes  PELVIC EXAM: VULVA: normal appearing vulva with atrophic change, no masses, tenderness or lesions, VAGINA: normal appearing vagina with atrophic change, normal color and discharge, no lesions, CERVIX: normal appearing atrophic cervix without discharge or lesions, UTERUS: uterus is normal size, shape, consistency and nontender, ADNEXA: normal adnexa in size, nontender and no masses  Chaperone: Caryn Bee present during the examination  ASSESSMENT:  54 y.o. G1P0010 here for a breast and pelvic exam  PLAN:   1. Postmenopausal.  No significant menopausal symptoms.  No vaginal bleeding. 2.  Pap smear 05/2018.  History of LEEP for CIN-3 in 1990.  Normal Pap smears since then.  Next Pap smear due 05/2021 following the current guidelines recommending the 3 year interval. 3. Mammogram 05/2020.  Normal breast exam today.  Continue with annual mammograms. 4. Colonoscopy 2016.  She will follow up at the interval recommended by her GI specialist.   5. Osteoporosis.  DEXA 07/2019.  T score -2.0, overall slight increase in BMD from 2018 study.  Had been on alendronate for approximately 4 1/2 years and then decided to stop earlier this year.  This is okay because the DEXA showed BMD stability and we were planning to institute a drug-free holiday anyway.  We discussed 1000 units vitamin D daily, may need to titrate up depending on the results of her vitamin D level today.  Discussed dietary calcium and supplement controversy.  Next DEXA recommended 07/2021. 6. Health maintenance.  Did review her cholesterol panel results today which showed overall improvement in total cholesterol level, continued lower than desirable HDL, and LDL just above the ideal range.  Also reviewed the elevated fasting glucose level.  She is working on weight loss.  She is physically active.  Recommended considering checking the glucose level again in 6 months.  We will continue to follow the cholesterol levels annually.  Will check TSH and vitamin D level since these apparently were not drawn at her recent lab visit.  Return annually or sooner, prn.  Joseph Pierini MD 06/20/20

## 2020-07-06 ENCOUNTER — Encounter: Payer: Self-pay | Admitting: Nurse Practitioner

## 2020-07-25 ENCOUNTER — Encounter: Payer: Self-pay | Admitting: Obstetrics and Gynecology

## 2020-07-25 ENCOUNTER — Other Ambulatory Visit (INDEPENDENT_AMBULATORY_CARE_PROVIDER_SITE_OTHER): Payer: 59

## 2020-07-25 ENCOUNTER — Ambulatory Visit: Payer: 59 | Admitting: Nurse Practitioner

## 2020-07-25 ENCOUNTER — Encounter: Payer: Self-pay | Admitting: Nurse Practitioner

## 2020-07-25 ENCOUNTER — Other Ambulatory Visit: Payer: Self-pay

## 2020-07-25 VITALS — BP 80/60 | HR 76 | Ht 67.5 in | Wt 185.5 lb

## 2020-07-25 DIAGNOSIS — R1032 Left lower quadrant pain: Secondary | ICD-10-CM

## 2020-07-25 DIAGNOSIS — K6289 Other specified diseases of anus and rectum: Secondary | ICD-10-CM

## 2020-07-25 DIAGNOSIS — K625 Hemorrhage of anus and rectum: Secondary | ICD-10-CM

## 2020-07-25 LAB — C-REACTIVE PROTEIN: CRP: <1 mg/dL (ref 0.5–20.0)

## 2020-07-25 LAB — COMPREHENSIVE METABOLIC PANEL
ALT: 17 U/L (ref 0–35)
AST: 14 U/L (ref 0–37)
Albumin: 4.2 g/dL (ref 3.5–5.2)
Alkaline Phosphatase: 73 U/L (ref 39–117)
BUN: 18 mg/dL (ref 6–23)
CO2: 32 mEq/L (ref 19–32)
Calcium: 9.4 mg/dL (ref 8.4–10.5)
Chloride: 103 mEq/L (ref 96–112)
Creatinine, Ser: 0.75 mg/dL (ref 0.40–1.20)
GFR: 89.2 mL/min (ref >60.00)
Glucose, Bld: 97 mg/dL (ref 70–99)
Potassium: 3.8 mEq/L (ref 3.5–5.1)
Sodium: 140 mEq/L (ref 135–145)
Total Bilirubin: 0.5 mg/dL (ref 0.2–1.2)
Total Protein: 7 g/dL (ref 6.0–8.3)

## 2020-07-25 LAB — CBC WITH DIFFERENTIAL/PLATELET
Basophils Absolute: 0 10*3/uL (ref 0.0–0.1)
Basophils Relative: 0.4 % (ref 0.0–3.0)
Eosinophils Absolute: 0.1 10*3/uL (ref 0.0–0.7)
Eosinophils Relative: 1.3 % (ref 0.0–5.0)
HCT: 40.6 % (ref 36.0–46.0)
Hemoglobin: 14.2 g/dL (ref 12.0–15.0)
Lymphocytes Relative: 36.8 % (ref 12.0–46.0)
Lymphs Abs: 2.1 10*3/uL (ref 0.7–4.0)
MCHC: 34.9 g/dL (ref 30.0–36.0)
MCV: 96.4 fl (ref 78.0–100.0)
Monocytes Absolute: 0.4 10*3/uL (ref 0.1–1.0)
Monocytes Relative: 6.1 % (ref 3.0–12.0)
Neutro Abs: 3.2 10*3/uL (ref 1.4–7.7)
Neutrophils Relative %: 55.4 % (ref 43.0–77.0)
Platelets: 266 10*3/uL (ref 150.0–400.0)
RBC: 4.21 Mil/uL (ref 3.87–5.11)
RDW: 12.1 % (ref 11.5–15.5)
WBC: 5.7 10*3/uL (ref 4.0–10.5)

## 2020-07-25 MED ORDER — HYDROCORTISONE ACETATE 25 MG RE SUPP: 25.0000 mg | Freq: Every evening | RECTAL | 0 refills | Status: DC

## 2020-07-25 MED ORDER — SUPREP BOWEL PREP KIT 17.5-3.13-1.6 GM/177ML PO SOLN: 1.0000 | ORAL | 0 refills | Status: DC

## 2020-07-25 NOTE — Progress Notes (Signed)
I agree with the above note, plan 

## 2020-07-25 NOTE — Patient Instructions (Signed)
If you are age 57 or older, your body mass index should be between 23-30. Your Body mass index is 28.62 kg/m. If this is out of the aforementioned range listed, please consider follow up with your Primary Care Provider.  If you are age 2 or younger, your body mass index should be between 19-25. Your Body mass index is 28.62 kg/m. If this is out of the aformentioned range listed, please consider follow up with your Primary Care Provider.   LABS: Your provider has requested that you go to the basement level for lab work before leaving today. Press "B" on the elevator. The lab is located at the first door on the left as you exit the elevator.  HEALTHCARE LAWS AND MY CHART RESULTS: Due to recent changes in healthcare laws, you may see the results of your imaging and laboratory studies on MyChart before your provider has had a chance to review them.  We understand that in some cases there may be results that are confusing or concerning to you. Not all laboratory results come back in the same time frame and the provider may be waiting for multiple results in order to interpret others.  Please give [place] 48 hours in order for your provider to thoroughly review all the results before contacting the office for clarification of your results.   You have been scheduled for a colonoscopy. Please follow written instructions given to you at your visit today.  Please pick up your prep supplies at the pharmacy within the next 1-3 days. If you use inhalers (even only as needed), please bring them with you on the day of your procedure.  MEDICATION  We have sent the following medication to your pharmacy for you to pick up at your convenience:  Anusol rectal suppository, use one at night for 5 nights.  OVER THE COUNTER MEDICATION  Please purchase the following medications over the counter and take as directed:       Miralax. Dissolve one capful in 8 ounces of water and drink before bed.  Benefiber. One tablespoon  daily.  Desitin. Apply small amount to the external anal area 3 times a day as needed.  Please call our office if your symptoms worsen.   It was great seeing you today!  Thank you for entrusting me with your care and choosing [facility].  Noralyn Pick, CRNP

## 2020-07-25 NOTE — Progress Notes (Signed)
07/25/2020 Jennifer Velez [id number] 07-13-1965   CHIEF COMPLAINT: Rectal pain and bleeding   HISTORY OF PRESENT ILLNESS:  Jennifer Velez is a 55 year old female with a past medical history of osteoporosis and IBS.  Past uterine myomectomy 2004 and LEEP procedure 1990.  She presents to our office today for further evaluation regarding rectal pain and bleeding.  She reports having intermittent rectal irritation and pain.  She is passing multiple smaller formed bowel movements daily  but does not feel emptied.  She describes feeling a sandpaper soreness inside her rectum which is worse after passing a bowel movement.  She sees bright red blood on the toilet tissue 2-3 times monthly for the past 6 months.  Last week after walking, she had rectal pain and when she went to the bathroom she saw a moderate amount of bright red blood soiled on her undergarment.  She has intermittent left lower quadrant abdominal pain which is dull and not severe for the past 2 weeks.  No fever.  No history of diverticulitis.  She passed 1 dark green or black solid stool last week which occurred after eating collards.  No further green/black stools since that time.  No Pepto-Bismol or iron use.  No dysphagia, heartburn or upper abdominal pain.  Infrequent NSAID use.  She underwent a colonoscopy by Dr. Ardis Hughs 10/19/2014 and two 3-58mm tubular adenomatous polyps were removed from the colon.  She was initially advised to repeat a colonoscopy in 5 years. However, this recall was extended to 7 years based after reviewing the revised recommendations for colon polyps surveillance. She reports having night sweats for the past 10 years. She had a tooth extraction in April and a bout of presumed food poisoning with a few days of N/V and diarrhea in May which resulted in losing 10 lbs. No further weight los since then. Her stress level is high as she is busy taking care of her mother with Alzheimer's and she has 3 adopted children, one with  Down's syndrome.  Maternal aunt and cousin with ulcerative colitis.  No family history of colorectal cancer.  Colonoscopy by Dr. Ardis Hughs 10/19/2014: 1. Two polyps 3-24mm were found, removed and sent to pathology 2. Mild diverticulosis was noted throughout the entire examined colon 3. The examination was otherwise normal - TUBULAR ADENOMA AND HYPERPLASTIC POLYP. NO HIGH GRADE DYSPLASIA OR MALIGNANCY IDENTIFIED.   CBC Latest Ref Rng & Units 05/15/2020 06/17/2019 06/10/2018  WBC 3.8 - 10.8 Thousand/uL 5.0 7.0 5.7  Hemoglobin 11.7 - 15.5 g/dL 14.3 14.8 14.4  Hematocrit 35.0 - 45.0 % 39.8 42.3 40.7  Platelets 140 - 400 Thousand/uL 247 281 258    CMP Latest Ref Rng & Units 05/15/2020 06/17/2019 06/10/2018  Glucose 65 - 99 mg/dL 108(H) 93 101(H)  BUN 7 - 25 mg/dL 16 16 14   Creatinine 0.50 - 1.05 mg/dL 0.71 0.78 0.79  Sodium 135 - 146 mmol/L 139 141 140  Potassium 3.5 - 5.3 mmol/L 4.2 3.9 4.0  Chloride 98 - 110 mmol/L 102 103 103  CO2 20 - 32 mmol/L 31 24 32  Calcium 8.6 - 10.4 mg/dL 9.3 8.9 8.8  Total Protein 6.1 - 8.1 g/dL 6.9 6.9 7.0  Total Bilirubin 0.2 - 1.2 mg/dL 0.4 0.5 0.4  Alkaline Phos 33 - 130 U/L - - -  AST 10 - 35 U/L 15 15 15   ALT 6 - 29 U/L 14 15 19     Past Medical History:  Diagnosis Date  .  CIN III (cervical intraepithelial neoplasia III)   . Endometriosis   . Fibroid   . IBS (irritable bowel syndrome)   . Osteoporosis 05/2017   T score -2.0 prior DEXA 2016 T score -2.8 AP spine   Past Surgical History:  Procedure Laterality Date  . CERVICAL BIOPSY  W/ LOOP ELECTRODE EXCISION  1999  . COLPOSCOPY    . EXPLORATORY LAPAROTOMY  2004   myomectomy and excision endometriosis  . PELVIC LAPAROSCOPY     x2  . SKIN CANCER EXCISION     Squamous cell   Social History: She is married.  She is a Agricultural engineer.  She has 3 adopted children from [place] ages 73, 10 and 31.  Her youngest child has Down's syndrome.  She is a non-smoker.  No alcohol use.  No drug use.  Family History:  Mother age 31 with Alzheimer's disease, diverticulitis required colon resection. Father's history unknown, father deceased.  Maternal grandmother with Alzheimers and 2 maternal aunts with Alzheimer's disease. Maternal aunt with ulcerative colitis with a colostomy. No family history of esophageal, gastric or colon cancer.   Allergies  Allergen Reactions  . Sulfonamide Derivatives     hives      Outpatient Encounter Medications as of 07/25/2020  Medication Sig  . Ascorbic Acid (VITAMIN C PO) Take 1,000 mg by mouth in the morning and at bedtime.   . IBUPROFEN PO Take 600 mg by mouth as needed.   Marland Kitchen MAGNESIUM PO Take 500 mg by mouth at bedtime.   Marland Kitchen VITAMIN D, ERGOCALCIFEROL, PO Take 2,000 Units by mouth in the morning.  (Patient not taking: Reported on 06/20/2020)   No facility-administered encounter medications on file as of 07/25/2020.     REVIEW OF SYSTEMS:  Gen: See HPI. CV: Denies chest pain, palpitations or edema. Resp: Denies cough, shortness of breath of hemoptysis.  GI: See HPI. GU : Denies urinary burning, blood in urine, increased urinary frequency or incontinence. MS: Denies joint pain, muscles aches or weakness. Derm: Denies rash, itchiness, skin lesions or unhealing ulcers. Psych: Denies depression, anxiety or memory loss. Heme: Denies bruising, bleeding. Neuro:  Denies headaches, dizziness or paresthesias. Endo:  Denies any problems with DM, thyroid or adrenal function.  PHYSICAL EXAM: LMP 11/02/2012   BP (!) 80/60 (BP Location: Left Arm, Patient Position: Sitting, Cuff Size: Normal)   Pulse 76   Ht 5' 7.5" (1.715 m)   Wt 185 lb 8 oz (84.1 kg)   LMP 11/02/2012   BMI 28.62 kg/m   Wt Readings from Last 3 Encounters:  07/25/20 185 lb 8 oz (84.1 kg)  06/20/20 184 lb (83.5 kg)  11/03/19 185 lb 4 oz (84 kg)   General: Well developed 55 year old female in no acute distress. Head: Normocephalic and atraumatic. Eyes:  Sclerae non-icteric, conjunctive pink. Ears:  Normal auditory acuity. Mouth: Dentition intact. No ulcers or lesions.  Neck: Supple, no lymphadenopathy or thyromegaly.  Lungs: Clear bilaterally to auscultation without wheezes, crackles or rhonchi. Heart: Regular rate and rhythm. No murmur, rub or gallop appreciated.  Abdomen: Soft.  Nondistended.  Mild tenderness to the right mid abdomen without rebound or guarding.  No left quadrant tenderness on exam.  No masses. No hepatosplenomegaly. Normoactive bowel sounds x 4 quadrants.  Rectal: No external hemorrhoids.  Internal hemorrhoids palpated without prolapse or active bleeding.  No palpable or obvious anal fissure even though her symptoms of sandpaper burning discomfort are typical of an anal fissure. Musculoskeletal: Symmetrical with no gross  deformities. Skin: Warm and dry. No rash or lesions on visible extremities. Extremities: No edema. Neurological: Alert oriented x 4, no focal deficits.  Psychological:  Alert and cooperative. Normal mood and affect.  ASSESSMENT AND PLAN:  33.  55 year old female with rectal pain and bleeding.  Nonbleeding internal hemorrhoids identified on exam, no obvious fissure. -Diagnostic colonoscopy benefits and risks discussed including risk with sedation, risk of bleeding, perforation and infection  -Anusol HC 25 mg suppository 1 PR nightly x5 nights -Apply a small amount of Desitin inside the anal opening and to the external anal area tid as needed for anal or hemorrhoidal irritation/bleeding.  -CBC -Patient to call our office if rectal bleeding or rectal pain worsens -Patient to call our office if she sees any black stools -MiraLAX/Benefiber if tolerated  2.  History of tubular adenomatous colon polyps per colonoscopy/2016. -See plan in #1  3.  Left lower quadrant abdominal pain x2 weeks. Diverticulosis throughout the colon per colonoscopy in 2016.  No LLQ tenderness on exam at this time. -CBC, CMP, CRP -Discussed scheduling abdominal/pelvic CAT scan if  her left lower quadrant abdominal pain persists or worsens or if WBC/CRP elevated.  Patient to call our office if her left lower quadrant abdominal pain persists or worsens -Colonoscopy as ordered above   Further follow-up to be determined after the above evaluation completed         CC:  Copland, Frederico Hamman, MD

## 2020-07-26 ENCOUNTER — Telehealth: Payer: Self-pay | Admitting: Nurse Practitioner

## 2020-07-26 NOTE — Telephone Encounter (Signed)
Inbound call from patient stating with her insurance, she has a $50 copay for Suprep.  Is wanting to know if gavilyte-n can be called in instead since it would be free.  Please advise.

## 2020-07-27 MED ORDER — PEG 3350-KCL-NA BICARB-NACL 420 G PO SOLR: 4000.0000 mL | Freq: Once | ORAL | 0 refills | Status: AC

## 2020-08-02 ENCOUNTER — Encounter: Payer: Self-pay | Admitting: Gastroenterology

## 2020-08-07 ENCOUNTER — Encounter: Payer: Self-pay | Admitting: Gastroenterology

## 2020-08-07 ENCOUNTER — Other Ambulatory Visit: Payer: Self-pay

## 2020-08-07 ENCOUNTER — Ambulatory Visit (AMBULATORY_SURGERY_CENTER): Payer: 59 | Admitting: Gastroenterology

## 2020-08-07 VITALS — BP 123/64 | HR 60 | Temp 98.4°F | Resp 15 | Ht 67.0 in | Wt 185.0 lb

## 2020-08-07 DIAGNOSIS — K625 Hemorrhage of anus and rectum: Secondary | ICD-10-CM

## 2020-08-07 DIAGNOSIS — D122 Benign neoplasm of ascending colon: Secondary | ICD-10-CM

## 2020-08-07 DIAGNOSIS — D120 Benign neoplasm of cecum: Secondary | ICD-10-CM | POA: Diagnosis not present

## 2020-08-07 DIAGNOSIS — K5730 Diverticulosis of large intestine without perforation or abscess without bleeding: Secondary | ICD-10-CM

## 2020-08-07 MED ORDER — SODIUM CHLORIDE 0.9 % IV SOLN: 500.0000 mL | Freq: Once | INTRAVENOUS | Status: DC

## 2020-08-07 NOTE — Patient Instructions (Signed)
Handouts given for polyps, diverticulosis and high fiber diet.  YOU HAD AN ENDOSCOPIC PROCEDURE TODAY AT [facility]:   Refer to the procedure report that was given to you for any specific questions about what was found during the examination.  If the procedure report does not answer your questions, please call your gastroenterologist to clarify.  If you requested that your care partner not be given the details of your procedure findings, then the procedure report has been included in a sealed envelope for you to review at your convenience later.  YOU SHOULD EXPECT: Some feelings of bloating in the abdomen. Passage of more gas than usual.  Walking can help get rid of the air that was put into your GI tract during the procedure and reduce the bloating. If you had a lower endoscopy (such as a colonoscopy or flexible sigmoidoscopy) you may notice spotting of blood in your stool or on the toilet paper. If you underwent a bowel prep for your procedure, you may not have a normal bowel movement for a few days.  Please Note:  You might notice some irritation and congestion in your nose or some drainage.  This is from the oxygen used during your procedure.  There is no need for concern and it should clear up in a day or so.  SYMPTOMS TO REPORT IMMEDIATELY:   Following lower endoscopy (colonoscopy or flexible sigmoidoscopy):  Excessive amounts of blood in the stool  Significant tenderness or worsening of abdominal pains  Swelling of the abdomen that is new, acute  Fever of 100F or higher  For urgent or emergent issues, a gastroenterologist can be reached at any hour by calling [phone]. Do not use MyChart messaging for urgent concerns.    DIET:  We do recommend a small meal at first, but then you may proceed to your regular diet.  Drink plenty of fluids but you should avoid alcoholic beverages for 24 hours.  ACTIVITY:  You should plan to take it easy for the rest of today and you  should NOT DRIVE, Work or use heavy machinery until tomorrow (because of the sedation medicines used during the test).    FOLLOW UP: Our staff will call the number listed on your records Wednesday 2/9 around 715-8 AM following your procedure to check on you and address any questions or concerns that you may have regarding the information given to you following your procedure. If we do not reach you, we will leave a message.  We will attempt to reach you two times.  During this call, we will ask if you have developed any symptoms of COVID 19. If you develop any symptoms (ie: fever, flu-like symptoms, shortness of breath, cough etc.) before then, please call [phone].  If you test positive for Covid 19 in the 2 weeks post procedure, please call and report this information to [place].    If any biopsies were taken you will be contacted by phone or by letter within the next 1-3 weeks.  Please call us at [phone] if you have not heard about the biopsies in 3 weeks.    SIGNATURES/CONFIDENTIALITY: You and/or your care partner have signed paperwork which will be entered into your electronic medical record.  These signatures attest to the fact that that the information above on your After Visit Summary has been reviewed and is understood.  Full responsibility of the confidentiality of this discharge information lies with you and/or your care-partner.

## 2020-08-07 NOTE — Progress Notes (Signed)
Called to room to assist during endoscopic procedure.  Patient ID and intended procedure confirmed with present staff. Received instructions for my participation in the procedure from the performing physician.  

## 2020-08-07 NOTE — Op Note (Signed)
[facility] Patient Name: Jennifer Velez Procedure Date: 08/07/2020 10:36 AM MRN: [id number] Endoscopist: Milus Banister , MD Age: 55 Referring MD:  Date of Birth: 19-May-1966 Gender: Female Account #: [id number] Procedure:                Colonoscopy Indications:              Rectal bleeding, sensation of incomplete                            evacuation; two subCM adenomas removed 2016 Medicines:                Monitored Anesthesia Care Procedure:                Pre-Anesthesia Assessment:                           - Prior to the procedure, a History and Physical                            was performed, and patient medications and                            allergies were reviewed. The patient's tolerance of                            previous anesthesia was also reviewed. The risks                            and benefits of the procedure and the sedation                            options and risks were discussed with the patient.                            All questions were answered, and informed consent                            was obtained. Prior Anticoagulants: The patient has                            taken no previous anticoagulant or antiplatelet                            agents. ASA Grade Assessment: II - A patient with                            mild systemic disease. After reviewing the risks                            and benefits, the patient was deemed in                            satisfactory condition to undergo the procedure.  After obtaining informed consent, the colonoscope                            was passed under direct vision. Throughout the                            procedure, the patient's blood pressure, pulse, and                            oxygen saturations were monitored continuously. The                            Colonoscope was introduced through the anus and                            advanced to the the cecum,  identified by                            appendiceal orifice and ileocecal valve. The                            colonoscopy was performed without difficulty. The                            patient tolerated the procedure well. The quality                            of the bowel preparation was good. The ileocecal                            valve, appendiceal orifice, and rectum were                            photographed. Scope In: 10:47:35 AM Scope Out: 11:04:35 AM Scope Withdrawal Time: 0 hours 9 minutes 6 seconds  Total Procedure Duration: 0 hours 17 minutes 0 seconds  Findings:                 Two sessile polyps were found in the ascending                            colon and cecum. The polyps were 4 to 6 mm in size.                            These polyps were removed with a cold snare.                            Resection and retrieval were complete.                           Multiple small and large-mouthed diverticula were                            found in the entire colon.  The exam was otherwise without abnormality on                            direct and retroflexion views. Complications:            No immediate complications. Estimated blood loss:                            None. Estimated Blood Loss:     Estimated blood loss: none. Impression:               - Two 4 to 6 mm polyps in the ascending colon and                            in the cecum, removed with a cold snare. Resected                            and retrieved.                           - Diverticulosis in the entire examined colon.                           - The examination was otherwise normal on direct                            and retroflexion views. Currently no internal or                            external hemorrhoids. Recommendation:           - Patient has a contact number available for                            emergencies. The signs and symptoms of potential                             delayed complications were discussed with the                            patient. Return to normal activities tomorrow.                            Written discharge instructions were provided to the                            patient.                           - Resume previous diet. Try switching fiber to                            Citrucel OTC (organge flavored) powder fiber                            supplement for now.                           -  Continue present medications.                           - Await pathology results. Milus Banister, MD 08/07/2020 11:09:14 AM This report has been signed electronically.

## 2020-08-07 NOTE — Progress Notes (Signed)
PT taken to PACU. Monitors in place. VSS. Report given to RN. 

## 2020-08-07 NOTE — Progress Notes (Signed)
VS by CW. ?

## 2020-08-09 ENCOUNTER — Telehealth: Payer: Self-pay

## 2020-08-09 NOTE — Telephone Encounter (Signed)
  Follow up Call-  Call back number 08/07/2020  Post procedure Call Back phone  # [phone]  Permission to leave phone message Yes  Some recent data might be hidden     Patient questions:  Do you have a fever, pain , or abdominal swelling? No. Pain Score  0 *  Have you tolerated food without any problems? Yes.    Have you been able to return to your normal activities? Yes.    Do you have any questions about your discharge instructions: Diet   No. Medications  No. Follow up visit  No.  Do you have questions or concerns about your Care? No.  Actions: * If pain score is 4 or above: No action needed, pain <4.  1. Have you developed a fever since your procedure? no  2.   Have you had an respiratory symptoms (SOB or cough) since your procedure? no  3.   Have you tested positive for COVID 19 since your procedure no  4.   Have you had any family members/close contacts diagnosed with the COVID 19 since your procedure?  no   If yes to any of these questions please route to Joylene John, RN and Joella Prince, RN

## 2020-08-15 ENCOUNTER — Encounter: Payer: Self-pay | Admitting: Gastroenterology

## 2020-11-16 ENCOUNTER — Other Ambulatory Visit: Payer: Self-pay

## 2020-11-16 ENCOUNTER — Encounter: Payer: Self-pay | Admitting: Family Medicine

## 2020-11-16 ENCOUNTER — Ambulatory Visit: Payer: 59 | Admitting: Family Medicine

## 2020-11-16 VITALS — BP 106/62 | HR 73 | Temp 97.0°F | Ht 67.0 in | Wt 183.0 lb

## 2020-11-16 DIAGNOSIS — R1011 Right upper quadrant pain: Secondary | ICD-10-CM | POA: Diagnosis not present

## 2020-11-16 DIAGNOSIS — R1013 Epigastric pain: Secondary | ICD-10-CM | POA: Diagnosis not present

## 2020-11-16 LAB — CBC WITH DIFFERENTIAL/PLATELET
Basophils Absolute: 0 10*3/uL (ref 0.0–0.1)
Basophils Relative: 0.3 % (ref 0.0–3.0)
Eosinophils Absolute: 0.1 10*3/uL (ref 0.0–0.7)
Eosinophils Relative: 1 % (ref 0.0–5.0)
HCT: 40.9 % (ref 36.0–46.0)
Hemoglobin: 14.4 g/dL (ref 12.0–15.0)
Lymphocytes Relative: 31.8 % (ref 12.0–46.0)
Lymphs Abs: 2.1 10*3/uL (ref 0.7–4.0)
MCHC: 35.1 g/dL (ref 30.0–36.0)
MCV: 95.9 fl (ref 78.0–100.0)
Monocytes Absolute: 0.3 10*3/uL (ref 0.1–1.0)
Monocytes Relative: 5.2 % (ref 3.0–12.0)
Neutro Abs: 4.1 10*3/uL (ref 1.4–7.7)
Neutrophils Relative %: 61.7 % (ref 43.0–77.0)
Platelets: 308 10*3/uL (ref 150.0–400.0)
RBC: 4.27 Mil/uL (ref 3.87–5.11)
RDW: 12.3 % (ref 11.5–15.5)
WBC: 6.7 10*3/uL (ref 4.0–10.5)

## 2020-11-16 LAB — BASIC METABOLIC PANEL
BUN: 16 mg/dL (ref 6–23)
CO2: 34 mEq/L — ABNORMAL HIGH (ref 19–32)
Calcium: 9.3 mg/dL (ref 8.4–10.5)
Chloride: 104 mEq/L (ref 96–112)
Creatinine, Ser: 0.74 mg/dL (ref 0.40–1.20)
GFR: 90.45 mL/min (ref >60.00)
Glucose, Bld: 101 mg/dL — ABNORMAL HIGH (ref 70–99)
Potassium: 3.7 mEq/L (ref 3.5–5.1)
Sodium: 142 mEq/L (ref 135–145)

## 2020-11-16 LAB — LIPASE: Lipase: 133 U/L — ABNORMAL HIGH (ref 11.0–59.0)

## 2020-11-16 LAB — HEPATIC FUNCTION PANEL
ALT: 13 U/L (ref 0–35)
AST: 14 U/L (ref 0–37)
Albumin: 4.2 g/dL (ref 3.5–5.2)
Alkaline Phosphatase: 68 U/L (ref 39–117)
Bilirubin, Direct: 0.1 mg/dL (ref 0.0–0.3)
Total Bilirubin: 0.5 mg/dL (ref 0.2–1.2)
Total Protein: 7 g/dL (ref 6.0–8.3)

## 2020-11-16 LAB — H. PYLORI ANTIBODY, IGG: H Pylori IgG: POSITIVE — AB

## 2020-11-16 NOTE — Patient Instructions (Signed)
Generic Omeprazole 20 mg. Take twice a day. This is over the counter.

## 2020-11-16 NOTE — Progress Notes (Addendum)
Jennifer Gladwin T. Schwanda Zima, MD, [place] at [facility] [place], [address]  Phone: [phone]  FAX: [phone]  Jennifer Velez - 55 y.o. female  MRN [id number]  Date of Birth: 03-09-1966  Date: 11/16/2020  PCP: Owens Loffler, MD  Referral: Joseph Pierini, MD  Chief Complaint  Patient presents with  . Abdominal Pain  . Cough    This visit occurred during the SARS-CoV-2 public health emergency.  Safety protocols were in place, including screening questions prior to the visit, additional usage of staff PPE, and extensive cleaning of exam room while observing appropriate contact time as indicated for disinfecting solutions.   Subjective:   Jennifer Velez is a 55 y.o. very pleasant female patient with Body mass index is 28.66 kg/m. who presents with the following:  Did have some Covid-19 around 11/06/2020.   Started to have some soreness in her abdomen.  In the last few weeks.  Will have a burning.  All around the anterior abd. she has an epigastric pain and fullness.  At this point, she is not having significant nausea, vomiting, diarrhea.  Classical COVID symptoms including cough, congestion have resolved and she has not had any loss of taste or smell.  She denies any bloody bowel movements or melanotic stool. Aside from gynecological surgery, she does not have any intra-abdominal surgical history.  Colon negative 2/22. GYN surgery only.     Review of Systems is noted in the HPI, as appropriate  Objective:   BP 106/62   Pulse 73   Temp (!) 97 F (36.1 C) (Temporal)   Ht 5\' 7"  (1.702 m)   Wt 183 lb (83 kg)   LMP 11/02/2012   SpO2 96%   BMI 28.66 kg/m   GEN: No acute distress; alert,appropriate. PULM: Breathing comfortably in no respiratory distress PSYCH: Normally interactive.  CV: RRR, no m/g/r  PULM: Normal respiratory rate, no accessory muscle use. No  wheezes, crackles or rhonchi  ABD: S, epigastric tenderness with right upper quadrant tenderness, ND, + BS, No rebound, No HSM   Laboratory and Imaging Data: Results for orders placed or performed in visit on 11/16/20  Lipase  Result Value Ref Range   Lipase 133.0 (H) 11.0 - 59.0 U/L  H. pylori antibody, IgG  Result Value Ref Range   H Pylori IgG Positive (A) Negative  CBC with Differential/Platelet  Result Value Ref Range   WBC 6.7 4.0 - 10.5 K/uL   RBC 4.27 3.87 - 5.11 Mil/uL   Hemoglobin 14.4 12.0 - 15.0 g/dL   HCT 40.9 36.0 - 46.0 %   MCV 95.9 78.0 - 100.0 fl   MCHC 35.1 30.0 - 36.0 g/dL   RDW 12.3 11.5 - 15.5 %   Platelets 308.0 150.0 - 400.0 K/uL   Neutrophils Relative % 61.7 43.0 - 77.0 %   Lymphocytes Relative 31.8 12.0 - 46.0 %   Monocytes Relative 5.2 3.0 - 12.0 %   Eosinophils Relative 1.0 0.0 - 5.0 %   Basophils Relative 0.3 0.0 - 3.0 %   Neutro Abs 4.1 1.4 - 7.7 K/uL   Lymphs Abs 2.1 0.7 - 4.0 K/uL   Monocytes Absolute 0.3 0.1 - 1.0 K/uL   Eosinophils Absolute 0.1 0.0 - 0.7 K/uL   Basophils Absolute 0.0 0.0 - 0.1 K/uL  Basic metabolic panel  Result Value Ref Range   Sodium 142 135 - 145 mEq/L  Potassium 3.7 3.5 - 5.1 mEq/L   Chloride 104 96 - 112 mEq/L   CO2 34 (H) 19 - 32 mEq/L   Glucose, Bld 101 (H) 70 - 99 mg/dL   BUN 16 6 - 23 mg/dL   Creatinine, Ser 0.74 0.40 - 1.20 mg/dL   GFR 90.45 >60.00 mL/min   Calcium 9.3 8.4 - 10.5 mg/dL  Hepatic function panel  Result Value Ref Range   Total Bilirubin 0.5 0.2 - 1.2 mg/dL   Bilirubin, Direct 0.1 0.0 - 0.3 mg/dL   Alkaline Phosphatase 68 39 - 117 U/L   AST 14 0 - 37 U/L   ALT 13 0 - 35 U/L   Total Protein 7.0 6.0 - 8.3 g/dL   Albumin 4.2 3.5 - 5.2 g/dL     Assessment and Plan:     ICD-10-CM   1. Epigastric abdominal pain  R10.13 US Abdomen Complete    Lipase    H. pylori antibody, IgG    CBC with Differential/Platelet    Basic metabolic panel    Hepatic function panel    CT Abdomen Pelvis Wo  Contrast  2. RUQ pain  R10.11 US Abdomen Complete    Lipase    H. pylori antibody, IgG    CBC with Differential/Platelet    Basic metabolic panel    Hepatic function panel    CT Abdomen Pelvis Wo Contrast   H. pylori is positive with an elevation of lipase.  Ulcer with pancreatitis.   Continue with light diet and plenty of fluids as able.   Addendum, 11/20/2020, 09:10 AM The patient has worsened over the weekend, pain has worsened, and globally she is doing much worse.  Concern for worsening pancreatitis with an initial lipase of 133.Obtain a CT of the abdomen and pelvis with contrast to evaluate for necrotic pancreas, free air, with concern for an acute abdomen.  Orders Placed This Encounter  Procedures  . US Abdomen Complete  . CT Abdomen Pelvis Wo Contrast  . Lipase  . H. pylori antibody, IgG  . CBC with Differential/Platelet  . Basic metabolic panel  . Hepatic function panel    Follow-up: No follow-ups on file.  Signed,  Maud Deed. Emersen Mascari, MD   Outpatient Encounter Medications as of 11/16/2020  Medication Sig  . amoxicillin (AMOXIL) 500 MG capsule Take 2 capsules (1,000 mg total) by mouth 2 (two) times daily.  . Ascorbic Acid (VITAMIN C PO) Take 1,000 mg by mouth in the morning and at bedtime.   . cholecalciferol (VITAMIN D3) 25 MCG (1000 UNIT) tablet Take 1,000 Units by mouth daily.  . clarithromycin (BIAXIN) 500 MG tablet Take 1 tablet (500 mg total) by mouth 2 (two) times daily.  . IBUPROFEN PO Take 600 mg by mouth as needed.   Marland Kitchen MAGNESIUM PO Take 500 mg by mouth at bedtime.   . metroNIDAZOLE (FLAGYL) 500 MG tablet Take 1 tablet (500 mg total) by mouth 2 (two) times daily.   No facility-administered encounter medications on file as of 11/16/2020.

## 2020-11-20 ENCOUNTER — Ambulatory Visit
Admission: RE | Admit: 2020-11-20 | Discharge: 2020-11-20 | Disposition: A | Discharge status: Home / Self Care | Payer: 59 | Source: Ambulatory Visit | Attending: Family Medicine | Admitting: Family Medicine

## 2020-11-20 ENCOUNTER — Other Ambulatory Visit: Payer: Self-pay | Admitting: Family Medicine

## 2020-11-20 DIAGNOSIS — I880 Nonspecific mesenteric lymphadenitis: Secondary | ICD-10-CM

## 2020-11-20 DIAGNOSIS — R1013 Epigastric pain: Secondary | ICD-10-CM

## 2020-11-20 DIAGNOSIS — R1011 Right upper quadrant pain: Secondary | ICD-10-CM

## 2020-11-20 MED ORDER — AMOXICILLIN 500 MG PO CAPS: 1000.0000 mg | ORAL_CAPSULE | Freq: Two times a day (BID) | ORAL | 0 refills | Status: DC

## 2020-11-20 MED ORDER — METRONIDAZOLE 500 MG PO TABS: 500.0000 mg | ORAL_TABLET | Freq: Two times a day (BID) | ORAL | 0 refills | Status: DC

## 2020-11-20 MED ORDER — CLARITHROMYCIN 500 MG PO TABS: 500.0000 mg | ORAL_TABLET | Freq: Two times a day (BID) | ORAL | 0 refills | Status: DC

## 2020-11-20 NOTE — Progress Notes (Signed)
- 

## 2020-11-20 NOTE — Addendum Note (Signed)
Addended by: Owens Loffler on: 11/20/2020 09:19 AM   Modules accepted: Orders

## 2020-11-29 NOTE — Telephone Encounter (Signed)
I spoke with Jennifer Velez again at [facility] - pt was actually scheduled with Dr Ardis Hughs.  Pt was offered a sooner appt with a PA but she requested to only be seen by the Doctor  Pt is scheduled on 01/12/21

## 2020-11-29 NOTE — Telephone Encounter (Signed)
I spent majority of the afternoon on hold with [facility] trying to schedule her appt as well as other Urgent referrals for their office.  It took two hours to get someone to answer the phone - phone lines were placed on immediate hold. Finally spoke with [facility]. They are booking out into July for URGENT/STAT referrals. The soonest that they were able to work [place] in was 01/12/21 with the PA.  I advised that we needed her seen within 1-2 weeks at most and they advised that they are severely short staffed and do not have any soon/urgent/work-in appts but that they would see what they could work out to get her seen asap.   Will send to Dr Lorelei Pont to make aware.   I have also let my Referrals leader know of their struggles so that someone can reach out to try and get to the root of the problem and possibly give some assistance.

## 2020-12-07 ENCOUNTER — Ambulatory Visit
Admission: RE | Admit: 2020-12-07 | Discharge: 2020-12-07 | Disposition: A | Discharge status: Home / Self Care | Payer: 59 | Source: Ambulatory Visit | Attending: Family Medicine | Admitting: Family Medicine

## 2020-12-07 DIAGNOSIS — R1013 Epigastric pain: Secondary | ICD-10-CM

## 2020-12-07 DIAGNOSIS — R1011 Right upper quadrant pain: Secondary | ICD-10-CM

## 2020-12-20 NOTE — Telephone Encounter (Signed)
I am going to need to recheck her in our office on Thursday or Friday.  She is doing poorly.  I think I need to examine her this week myself.  Can you call her and help facilitate.

## 2020-12-21 ENCOUNTER — Ambulatory Visit: Payer: 59 | Admitting: Family Medicine

## 2020-12-21 ENCOUNTER — Encounter: Payer: Self-pay | Admitting: Family Medicine

## 2020-12-21 ENCOUNTER — Other Ambulatory Visit: Payer: Self-pay

## 2020-12-21 VITALS — BP 100/70 | HR 73 | Temp 97.5°F | Ht 67.0 in | Wt 182.2 lb

## 2020-12-21 DIAGNOSIS — R1011 Right upper quadrant pain: Secondary | ICD-10-CM | POA: Diagnosis not present

## 2020-12-21 DIAGNOSIS — R1013 Epigastric pain: Secondary | ICD-10-CM | POA: Diagnosis not present

## 2020-12-21 DIAGNOSIS — I880 Nonspecific mesenteric lymphadenitis: Secondary | ICD-10-CM

## 2020-12-21 LAB — HEPATIC FUNCTION PANEL
ALT: 32 U/L (ref 0–35)
AST: 19 U/L (ref 0–37)
Albumin: 4.3 g/dL (ref 3.5–5.2)
Alkaline Phosphatase: 63 U/L (ref 39–117)
Bilirubin, Direct: 0.1 mg/dL (ref 0.0–0.3)
Total Bilirubin: 0.5 mg/dL (ref 0.2–1.2)
Total Protein: 7.1 g/dL (ref 6.0–8.3)

## 2020-12-21 LAB — CBC WITH DIFFERENTIAL/PLATELET
Basophils Absolute: 0 10*3/uL (ref 0.0–0.1)
Basophils Relative: 0.7 % (ref 0.0–3.0)
Eosinophils Absolute: 0.1 10*3/uL (ref 0.0–0.7)
Eosinophils Relative: 1.6 % (ref 0.0–5.0)
HCT: 41.6 % (ref 36.0–46.0)
Hemoglobin: 14.9 g/dL (ref 12.0–15.0)
Lymphocytes Relative: 44.6 % (ref 12.0–46.0)
Lymphs Abs: 2.4 10*3/uL (ref 0.7–4.0)
MCHC: 35.9 g/dL (ref 30.0–36.0)
MCV: 95.3 fl (ref 78.0–100.0)
Monocytes Absolute: 0.3 10*3/uL (ref 0.1–1.0)
Monocytes Relative: 5.7 % (ref 3.0–12.0)
Neutro Abs: 2.6 10*3/uL (ref 1.4–7.7)
Neutrophils Relative %: 47.4 % (ref 43.0–77.0)
Platelets: 261 10*3/uL (ref 150.0–400.0)
RBC: 4.36 Mil/uL (ref 3.87–5.11)
RDW: 12.3 % (ref 11.5–15.5)
WBC: 5.4 10*3/uL (ref 4.0–10.5)

## 2020-12-21 LAB — AMYLASE: Amylase: 44 U/L (ref 27–131)

## 2020-12-21 LAB — LIPASE: Lipase: 48 U/L (ref 11.0–59.0)

## 2020-12-21 MED ORDER — PANTOPRAZOLE SODIUM 40 MG PO TBEC: 40.0000 mg | DELAYED_RELEASE_TABLET | Freq: Two times a day (BID) | ORAL | 3 refills | Status: DC

## 2020-12-21 NOTE — Progress Notes (Signed)
Iden Stripling T. Rajiv Parlato, MD, [place] at [facility] [place], [address]  Phone: [phone]  FAX: [phone]  Kinaya Hilliker - 55 y.o. female  MRN [id number]  Date of Birth: 1966/05/16  Date: 12/21/2020  PCP: Owens Loffler, MD  Referral: Owens Loffler, MD  Chief Complaint  Patient presents with   Abdominal Pain    This visit occurred during the SARS-CoV-2 public health emergency.  Safety protocols were in place, including screening questions prior to the visit, additional usage of staff PPE, and extensive cleaning of exam room while observing appropriate contact time as indicated for disinfecting solutions.   Subjective:   Kellene Mccleary is a 55 y.o. very pleasant female patient with Body mass index is 28.54 kg/m. who presents with the following:  She is a known patient, and she presents with some ongoing abdominal pain that has been present now for greater than a month.  I have attempted to get her seen at GI, and the earliest even for a stat appointment was not until July.  I asked her to come in today for additional evaluation.  On her CT of the abdomen and pelvis they did note some Mees enteritis, and her ultrasound of the right upper quadrant also did not elicit additional information.  At this point, her source of pain is predominantly in the right upper quadrant.  PPI? Stopped all of that.  Last week.   Urinating and BM are ok. Not eating as much. With start eating, she will feel full.  No black stool. - Was having some black stools and even before her colonoscopy.  HFP and Lipase.  CBC    Review of Systems is noted in the HPI, as appropriate  Objective:   BP 100/70   Pulse 73   Temp (!) 97.5 F (36.4 C) (Temporal)   Ht 5\' 7"  (1.702 m)   Wt 182 lb 4 oz (82.7 kg)   LMP 11/02/2012   SpO2 98%   BMI 28.54 kg/m   GEN: No acute distress; alert,appropriate. PULM: Breathing  comfortably in no respiratory distress PSYCH: Normally interactive.  ABD: S, greatest pain is in the right upper quadrant.  She does have some pain in the right lower quadrant and hypogastric region, ND, + BS, No rebound, No HSM   Laboratory and Imaging Data: US Abdomen Complete  Result Date: 12/09/2020 CLINICAL DATA:  Epigastric and right upper quadrant pain. Follow-up CT scan from Nov 20, 2020. EXAM: ABDOMEN ULTRASOUND COMPLETE COMPARISON:  None. FINDINGS: Gallbladder: No gallstones or wall thickening visualized. No sonographic Murphy sign noted by sonographer. Common bile duct: Diameter: 4.0 mm Liver: Several small cysts. There is a 1.4 x 1.4 x 1.5 cm solid hyperechoic mass, almost certainly a hemangioma. Portal vein is patent on color Doppler imaging with normal direction of blood flow towards the liver. IVC: No abnormality visualized. Pancreas: Visualized portion unremarkable. Spleen: Size and appearance within normal limits. Right Kidney: Length: 11.7 cm. Contains a tiny complicated cyst measuring 9 mm. Left Kidney: Length: 11.4 cm. Echogenicity within normal limits. No mass or hydronephrosis visualized. Abdominal aorta: No aneurysm visualized. Other findings: None. IMPRESSION: 1. Multiple small cysts in the liver. There is a 1.5 cm hyperechoic mass is well in the right hepatic lobe, almost certainly a hemangioma. 2. Tiny cyst in the right kidney measuring 9 mm of no significance. 3. No other abnormalities. Electronically Signed   By: Dorise Bullion III M.D  On: 12/09/2020 17:29     CLINICAL DATA:  Worsening abdominal pain, elevated lipase, right upper quadrant pain.   EXAM: CT ABDOMEN AND PELVIS WITHOUT CONTRAST   TECHNIQUE: Multidetector CT imaging of the abdomen and pelvis was performed following the standard protocol without IV contrast.   COMPARISON:  CT 05/27/2007   FINDINGS: Lower chest: Lung bases are clear. Normal heart size. No pericardial effusion.   Hepatobiliary: Normal  hepatic attenuation. Smooth liver surface contour. Multiple hypoattenuating foci are present throughout the liver, largest seen in the anterior left lobe measuring up to 12 mm in size (2/24), and up to 16 mm in the right lobe liver (2/17). These measure approximate of simple fluid attenuation (10 HU) and favor benign up attic cysts though are notably new from comparison CT 2008 with nonvisualization of cysts present on the comparison study as well. No concerning focal liver lesion is seen. Gallbladder largely decompressed at the time of exam. No visible calcified gallstone or biliary ductal dilatation.   Pancreas: No significant peripancreatic inflammation. No pancreatic ductal dilatation.   Spleen: Normal in size. No concerning splenic lesions.   Adrenals/Urinary Tract: Normal adrenal glands. Kidneys are symmetric in size and normally located. Few subcentimeter hypoattenuating foci are present in the right kidney, too small to fully characterize though statistically likely benign. No concerning visible or contour deforming renal lesions. No urolithiasis or hydronephrosis. Urinary bladder is partially decompressed at time of exam. No gross bladder abnormality accounting for the degree of distention.   Stomach/Bowel: Distal esophagus, stomach and duodenal sweep are unremarkable. No small bowel wall thickening or dilatation. No evidence of obstruction. Normal appearing air-filled appendix coursing in a retrocecal fashion from the tip of the cecum along the right pelvic sidewall. No periappendiceal inflammation. Moderate colonic stool burden. No colonic dilatation or wall thickening. Few scattered noninflamed colonic diverticula.   Vascular/Lymphatic: Minimal atherosclerotic plaque in the abdominal aorta. Focal region of mid mesenteric hazy stranding with numerous reactive appearing clustered mid mesenteric lymph nodes compatible with mesenteritis (2/36). No conspicuous enlarged  abdominopelvic nodes.   Reproductive: Anteverted uterus. Posterior lower uterine segment fibroid is again noted, less well characterized on this noncontrast exam. No concerning adnexal lesions.   Other: No abdominopelvic free fluid or free gas. No bowel containing hernias.   Musculoskeletal: No acute osseous abnormality or suspicious osseous lesion.   IMPRESSION: No evidence of acute peripancreatic inflammation or ductal dilatation.   Small focal region of geographic mesenteric stranding in the mid abdomen, associated reactive lymph nodes, appearance compatible with mesenteritis.   Scattered shifting small fluid attenuation foci throughout the liver, likely benign hepatic cyst though incompletely characterized on this exam. There are hepatic risk factors, further evaluation would be warranted with ultrasound or MR imaging.     Electronically Signed   By: Lovena Le M.D.   On: 11/20/2020 15:50    Assessment and Plan:     ICD-10-CM   1. RUQ pain  R10.11 CBC with Differential/Platelet    Hepatic function panel    Amylase    Lipase    NM Hepato W/EF    2. Epigastric abdominal pain  R10.13 CBC with Differential/Platelet    Hepatic function panel    Amylase    Lipase    NM Hepato W/EF    3. Mesenteric adenitis  I88.0      Right upper quadrant pain as the most severe source of pain right now greater than 1 month.  She had an normal ultrasound.  CT  the abdomen and pelvis is positive for mesenteric adenitis.  Her pain has not been improving, and now this is focused to the right upper quadrant.  Obtain a HIDA scan to evaluate gallbladder and gallbladder ejection fraction and bile flow from the liver.   Reassess liver function, pancreatic enzymes.  I think there is risk of ulcer still, continue with PPI until evaluated by GI.  Meds ordered this encounter  Medications   pantoprazole (PROTONIX) 40 MG tablet    Sig: Take 1 tablet (40 mg total) by mouth 2 (two) times  daily before a meal.    Dispense:  60 tablet    Refill:  3   Medications Discontinued During This Encounter  Medication Reason   amoxicillin (AMOXIL) 500 MG capsule Completed Course   clarithromycin (BIAXIN) 500 MG tablet Completed Course   metroNIDAZOLE (FLAGYL) 500 MG tablet Completed Course   Orders Placed This Encounter  Procedures   NM Hepato W/EF   CBC with Differential/Platelet   Hepatic function panel   Amylase   Lipase    Follow-up: No follow-ups on file.  Signed,  Maud Deed. Ramar Nobrega, MD   Outpatient Encounter Medications as of 12/21/2020  Medication Sig   Ascorbic Acid (VITAMIN C PO) Take 1,000 mg by mouth in the morning and at bedtime.    cholecalciferol (VITAMIN D3) 25 MCG (1000 UNIT) tablet Take 1,000 Units by mouth daily.   IBUPROFEN PO Take 600 mg by mouth as needed.    MAGNESIUM PO Take 500 mg by mouth at bedtime.    pantoprazole (PROTONIX) 40 MG tablet Take 1 tablet (40 mg total) by mouth 2 (two) times daily before a meal.   [DISCONTINUED] amoxicillin (AMOXIL) 500 MG capsule Take 2 capsules (1,000 mg total) by mouth 2 (two) times daily.   [DISCONTINUED] clarithromycin (BIAXIN) 500 MG tablet Take 1 tablet (500 mg total) by mouth 2 (two) times daily.   [DISCONTINUED] metroNIDAZOLE (FLAGYL) 500 MG tablet Take 1 tablet (500 mg total) by mouth 2 (two) times daily.   No facility-administered encounter medications on file as of 12/21/2020.

## 2021-01-11 ENCOUNTER — Ambulatory Visit (HOSPITAL_COMMUNITY)
Admission: RE | Admit: 2021-01-11 | Discharge: 2021-01-11 | Disposition: A | Discharge status: Home / Self Care | Payer: 59 | Source: Ambulatory Visit | Attending: Family Medicine | Admitting: Family Medicine

## 2021-01-11 ENCOUNTER — Other Ambulatory Visit: Payer: Self-pay

## 2021-01-11 DIAGNOSIS — R1013 Epigastric pain: Secondary | ICD-10-CM | POA: Insufficient documentation

## 2021-01-11 DIAGNOSIS — R1011 Right upper quadrant pain: Secondary | ICD-10-CM | POA: Insufficient documentation

## 2021-01-11 MED ORDER — TECHNETIUM TC 99M MEBROFENIN IV KIT
4.9500 | PACK | Freq: Once | INTRAVENOUS | Status: AC | PRN
  Administered 2021-01-11: 4.95 via INTRAVENOUS

## 2021-01-12 ENCOUNTER — Ambulatory Visit: Payer: 59 | Admitting: Nurse Practitioner

## 2021-01-12 ENCOUNTER — Encounter: Payer: Self-pay | Admitting: Nurse Practitioner

## 2021-01-12 VITALS — BP 120/60 | HR 72 | Ht 67.5 in | Wt 184.4 lb

## 2021-01-12 DIAGNOSIS — K7689 Other specified diseases of liver: Secondary | ICD-10-CM | POA: Diagnosis not present

## 2021-01-12 DIAGNOSIS — R1011 Right upper quadrant pain: Secondary | ICD-10-CM | POA: Diagnosis not present

## 2021-01-12 DIAGNOSIS — K769 Liver disease, unspecified: Secondary | ICD-10-CM

## 2021-01-12 DIAGNOSIS — Z8619 Personal history of other infectious and parasitic diseases: Secondary | ICD-10-CM | POA: Diagnosis not present

## 2021-01-12 MED ORDER — HYOSCYAMINE SULFATE 0.125 MG SL SUBL: 0.1250 mg | SUBLINGUAL_TABLET | Freq: Four times a day (QID) | SUBLINGUAL | 0 refills | Status: DC | PRN

## 2021-01-12 NOTE — Patient Instructions (Addendum)
PROCEDURES: You have been scheduled for a EGD. Please follow the written instructions given to you at your visit today. If you use inhalers (even only as needed), please bring them with you on the day of your procedure.  Continue Pantoprazole 40 MG tablet, take 1 twice a day. May add Pepcid 20 MG at night as needed. This is over the counter.  MEDICATION: We have sent the following medication to your pharmacy for you to pick up at your convenience: Hyoscyamine (LEVSIN SL) 0.125 MG SL tablet: Place 1 tablet (0.125 mg total) under the tongue every 6 (six) hours as needed.  RECOMMENDATIONS: May need abdominal MRI with contrast to further evaluate liver lesion. Follow GERD information.  It was great seeing you today! Thank you for entrusting me with your care and choosing [facility].  Noralyn Pick, CRNP  Gastroesophageal Reflux Disease, Adult  Gastroesophageal reflux (GER) happens when acid from the stomach flows up into the tube that connects the mouth and the stomach (esophagus). Normally, food travels down the esophagus and stays in the stomach to be digested. With GER, food and stomach acid sometimes move back up into theesophagus. You may have a disease called gastroesophageal reflux disease (GERD) if the reflux: Happens often. Causes frequent or very bad symptoms. Causes problems such as damage to the esophagus. When this happens, the esophagus becomes sore and swollen. Over time, GERD can make small holes (ulcers) in the lining of the esophagus. What are the causes? This condition is caused by a problem with the muscle between the esophagus and the stomach. When this muscle is weak or not normal, it does not close properlyto keep food and acid from coming back up from the stomach. The muscle can be weak because of: Tobacco use. Pregnancy. Having a certain type of hernia (hiatal hernia). Alcohol use. Certain foods and drinks, such as coffee, chocolate, onions,  and peppermint. What increases the risk? Being overweight. Having a disease that affects your connective tissue. Taking NSAIDs, such a ibuprofen. What are the signs or symptoms? Heartburn. Difficult or painful swallowing. The feeling of having a lump in the throat. A bitter taste in the mouth. Bad breath. Having a lot of saliva. Having an upset or bloated stomach. Burping. Chest pain. Different conditions can cause chest pain. Make sure you see your doctor if you have chest pain. Shortness of breath or wheezing. A long-term cough or a cough at night. Wearing away of the surface of teeth (tooth enamel). Weight loss. How is this treated? Making changes to your diet. Taking medicine. Having surgery. Treatment will depend on how bad your symptoms are. Follow these instructions at home: Eating and drinking  Follow a diet as told by your doctor. You may need to avoid foods and drinks such as: Coffee and tea, with or without caffeine. Drinks that contain alcohol. Energy drinks and sports drinks. Bubbly (carbonated) drinks or sodas. Chocolate and cocoa. Peppermint and mint flavorings. Garlic and onions. Horseradish. Spicy and acidic foods. These include peppers, chili powder, curry powder, vinegar, hot sauces, and BBQ sauce. Citrus fruit juices and citrus fruits, such as oranges, lemons, and limes. Tomato-based foods. These include red sauce, chili, salsa, and pizza with red sauce. Fried and fatty foods. These include donuts, french fries, potato chips, and high-fat dressings. High-fat meats. These include hot dogs, rib eye steak, sausage, ham, and bacon. High-fat dairy items, such as whole milk, butter, and cream cheese. Eat small meals often. Avoid eating large meals. Avoid  drinking large amounts of liquid with your meals. Avoid eating meals during the 2-3 hours before bedtime. Avoid lying down right after you eat. Do not exercise right after you eat.  Lifestyle  Do not  smoke or use any products that contain nicotine or tobacco. If you need help quitting, ask your doctor. Try to lower your stress. If you need help doing this, ask your doctor. If you are overweight, lose an amount of weight that is healthy for you. Ask your doctor about a safe weight loss goal.  General instructions Pay attention to any changes in your symptoms. Take over-the-counter and prescription medicines only as told by your doctor. Do not take aspirin, ibuprofen, or other NSAIDs unless your doctor says it is okay. Wear loose clothes. Do not wear anything tight around your waist. Raise (elevate) the head of your bed about 6 inches (15 cm). You may need to use a wedge to do this. Avoid bending over if this makes your symptoms worse. Keep all follow-up visits. Contact a doctor if: You have new symptoms. You lose weight and you do not know why. You have trouble swallowing or it hurts to swallow. You have wheezing or a cough that keeps happening. You have a hoarse voice. Your symptoms do not get better with treatment. Get help right away if: You have sudden pain in your arms, neck, jaw, teeth, or back. You suddenly feel sweaty, dizzy, or light-headed. You have chest pain or shortness of breath. You vomit and the vomit is green, yellow, or black, or it looks like blood or coffee grounds. You faint. Your poop (stool) is red, bloody, or black. You cannot swallow, drink, or eat. These symptoms may represent a serious problem that is an emergency. Do not wait to see if the symptoms will go away. Get medical help right away. Call your local emergency services (911 in the [place] Do not drive yourself to the hospital. Summary If a person has gastroesophageal reflux disease (GERD), food and stomach acid move back up into the esophagus and cause symptoms or problems such as damage to the esophagus. Treatment will depend on how bad your symptoms are. Follow a diet as told by your doctor. Take  all medicines only as told by your doctor. This information is not intended to replace advice given to you by your health care provider. Make sure you discuss any questions you have with your healthcare provider. Document Revised: 12/27/2019 Document Reviewed: 12/27/2019 Elsevier Patient Education  2022 [facility] providers would like to encourage you to use [facility] to communicate with providers for non-urgent requests or questions.  Due to long hold times on the telephone, sending your provider a message by [facility] may be faster and more efficient way to get a response. Please allow 48 business hours for a response.  Please remember that this is for non-urgent requests/questions.  If you are age 105 or older, your body mass index should be between 23-30. Your Body mass index is 28.45 kg/m. If this is out of the aforementioned range listed, please consider follow up with your Primary Care Provider.  If you are age 65 or younger, your body mass index should be between 19-25. Your Body mass index is 28.45 kg/m. If this is out of the aformentioned range listed, please consider follow up with your Primary Care Provider.

## 2021-01-12 NOTE — Progress Notes (Signed)
01/12/2021 Jennifer Velez [id number] 05/18/1966   Chief Complaint:  RUQ pain   History of Present Illness:  Jennifer Velez is a 55 year old female with a past medical history of osteoporosis and IBS.  Past uterine myomectomy 2004 and LEEP procedure 1990.  Covid 19 infection May 2022.   She presents to our office today as referred by Dr. Randall An for further evaluation regarding epigastric and RUQ pain which initially started March 2022.    She had worsening RUQ pain and she was seen by Dr. Lorelei Pont. Laboratory testing was positive for H. pylori IgG antibody.  She was prescribed Clarithromycin 500 mg 1 p.o. twice daily and Metronidazole 500 mg 1 p.o. twice daily and Pantoprazole 40 mg p.o. twice daily for 14 days to treat her H. pylori infection.  Lipase level was elevated at 133.  LFTs were normal.  An abdominal/pelvic CT scan without contrast 11/20/2020 showed multiple hypoattenuating foci present throughout the liver, the largest measuring 16 mm in the right liver lobe thought to be cysts.  The gallbladder was decompressed without evidence of gallstones or biliary ductal dilatation.  Pancreas was normal.  Mesenteric stranding in the mid abdomen with reactive lymph nodes consistent with mesenteritis.  Abdominal sonogram 12/07/2020 showed small liver cyst with a 1.5 cm mass in the right hepatic lobe thought to be a hemangioma, the gallbladder was normal.  She underwent a [facility] HIDA scan yesterday to rule out gallbladder dyskinesia, the results are pending.  She continues to complain of RUQ pain described as an open sore sensation which is constant with intermittent sharp knifelike pain.  Her RUQ pain decreased over the past 2 weeks however, over the past few days has increased a bit.  No nausea or vomiting.  She has itchiness to the RUQ area for the past year which she attributed to having eczema.  No heartburn or dysphagia.  Infrequent NSAID use.  She underwent an EGD by Dr. Sharlett Iles in 1987  showed evidence of esophagitis, the patient stated she was not sedated for this procedure and it was awful.  She is passing a fairly normal brown bowel movement most daily.  No further rectal bleeding.  Her most recent colonoscopy was 08/07/2020 which identified 2 tubular adenomatous polyps which were removed from the ascending colon and cecum and diverticulosis in the entire examined colon.  She was advised to repeat a colonoscopy in 7 years.  No fever, sweats or chills.  She lost a few pounds but feels she gained it back.  Hepatic Function Latest Ref Rng & Units 12/21/2020 11/16/2020 07/25/2020  Total Protein 6.0 - 8.3 g/dL 7.1 7.0 7.0  Albumin 3.5 - 5.2 g/dL 4.3 4.2 4.2  AST 0 - 37 U/L _0 ALT 0 - 35 U/L 32 13 17  Alk Phosphatase 39 - 117 U/L 63 68 73  Total Bilirubin 0.2 - 1.2 mg/dL 0.5 0.5 0.5  Bilirubin, Direct 0.0 - 0.3 mg/dL 0.1 0.1 -  Lipase 133 on 11/16/2020 Lipase 49 on 12/21/2020   CBC Latest Ref Rng & Units 12/21/2020 11/16/2020 07/25/2020  WBC 4.0 - 10.5 K/uL 5.4 6.7 5.7  Hemoglobin 12.0 - 15.0 g/dL 14.9 14.4 14.2  Hematocrit 36.0 - 46.0 % 41.6 40.9 40.6  Platelets 150.0 - 400.0 K/uL 261.0 308.0 266.0     Abdominal sonogram 12/07/2020: 1. Multiple small cysts in the liver. There is a 1.5 cm hyperechoic mass is well in the right hepatic lobe, almost certainly a  hemangioma.  Gallbladder: No gallstones or wall thickening visualized. No sonographic Murphy sign noted by sonographer.  Common bile duct: Diameter: 4.0 mm 2. Tiny cyst in the right kidney measuring 9 mm of no significance. 3. No other abnormalities.    CTAP without contrast 11/20/2020: Lower chest: Lung bases are clear. Normal heart size. No pericardial effusion.  Hepatobiliary: Normal hepatic attenuation. Smooth liver surface contour. Multiple hypoattenuating foci are present throughout the liver, largest seen in the anterior left lobe measuring up to 12 mm in size (2/24), and up to 16 mm in the right lobe liver  (2/17). These measure approximate of simple fluid attenuation (10 HU) and favor benign up attic cysts though are notably new from comparison CT 2008 with nonvisualization of cysts present on the comparison study as well. No concerning focal liver lesion is seen. Gallbladder largely decompressed at the time of exam. No visible calcified gallstone or biliary ductal dilatation.   Pancreas: No significant peripancreatic inflammation. No pancreatic ductal dilatation.   Spleen: Normal in size. No concerning splenic lesions.   Adrenals/Urinary Tract: Normal adrenal glands. Kidneys are symmetric in size and normally located. Few subcentimeter hypoattenuating foci are present in the right kidney, too small to fully characterize though statistically likely benign. No concerning visible or contour deforming renal lesions. No urolithiasis or hydronephrosis. Urinary bladder is partially decompressed at time of exam. No gross bladder abnormality accounting for the degree of distention.   Stomach/Bowel: Distal esophagus, stomach and duodenal sweep are unremarkable. No small bowel wall thickening or dilatation. No evidence of obstruction. Normal appearing air-filled appendix coursing in a retrocecal fashion from the tip of the cecum along the right pelvic sidewall. No periappendiceal inflammation. Moderate colonic stool burden. No colonic dilatation or wall thickening. Few scattered noninflamed colonic diverticula.   Vascular/Lymphatic: Minimal atherosclerotic plaque in the abdominal aorta. Focal region of mid mesenteric hazy stranding with numerous reactive appearing clustered mid mesenteric lymph nodes compatible with mesenteritis (2/36). No conspicuous enlarged abdominopelvic nodes.   Reproductive: Anteverted uterus. Posterior lower uterine segment fibroid is again noted, less well characterized on this noncontrast exam. No concerning adnexal lesions.   Other: No abdominopelvic free fluid  or free gas. No bowel containing hernias.   Musculoskeletal: No acute osseous abnormality or suspicious osseous lesion.   IMPRESSION: No evidence of acute peripancreatic inflammation or ductal dilatation.   Small focal region of geographic mesenteric stranding in the mid abdomen, associated reactive lymph nodes, appearance compatible with mesenteritis.   Scattered shifting small fluid attenuation foci throughout the liver, likely benign hepatic cyst though incompletely characterized on this exam. There are hepatic risk factors, further evaluation would be warranted with ultrasound or MR imaging.    Colonoscopy 08/07/2020 by Dr. Ardis Hughs: - Two 4 to 6 mm polyps in the ascending colon and in the cecum, removed with a cold snare. Resected and retrieved. - Diverticulosis in the entire examined colon. - The examination was otherwise normal on direct and retroflexion views. Currently no internal or external hemorrhoids. - 7 year colonoscopy recall Surgical [P], colon, ascending and cecum, polyp (2) - TUBULAR ADENOMA (X2 FRAGMENTS). - NO HIGH GRADE DYSPLASIA OR MALIGNANCY  Current Medications, Allergies, Past Medical History, Past Surgical History, Family History and Social History were reviewed in [employer] record.   Review of Systems:   Constitutional: Negative for fever, sweats, chills or weight loss.  Respiratory: Negative for shortness of breath.   Cardiovascular: Negative for chest pain, palpitations and leg swelling.  Gastrointestinal: See  HPI.  Musculoskeletal: Negative for back pain or muscle aches.  Neurological: Negative for dizziness, headaches or paresthesias.    Physical Exam: BP 120/60 (BP Location: Left Arm, Patient Position: Sitting, Cuff Size: Normal)   Pulse 72   Ht 5' 7.5" (1.715 m) Comment: height measured without shoes  Wt 184 lb 6 oz (83.6 kg)   LMP 11/02/2012   BMI 28.45 kg/m   Wt Readings from Last 3 Encounters:  01/12/21  184 lb 6 oz (83.6 kg)  12/21/20 182 lb 4 oz (82.7 kg)  11/16/20 183 lb (83 kg)    General: 55 year old female in no acute distress. Head: Normocephalic and atraumatic. Eyes: No scleral icterus. Conjunctiva pink . Ears: Normal auditory acuity. Mouth: Dentition intact. No ulcers or lesions.  Lungs: Clear throughout to auscultation. Heart: Regular rate and rhythm, no murmur. Abdomen: Soft, nondistended.  Mild to moderate epigastric and right upper quadrant tenderness without rebound or guarding.  No masses or hepatomegaly. Normal bowel sounds x 4 quadrants.  Rectal: Deferred.  Musculoskeletal: Symmetrical with no gross deformities. Extremities: No edema. Neurological: Alert oriented x 4. No focal deficits.  Psychological: Alert and cooperative. Normal mood and affect  Assessment and Recommendations:  73.  55 year old female with epigastric and RUQ pain.  + H. Pylori IgG antibodies treated with clarithromycin/metronidazole/pantoprazole for 14 days.  No nausea or vomiting.  [facility] HIDA completed 01/11/2021 results pending -Continue Pantoprazole 3m po bid. May add Pepcid 20 mg nightly as needed. -Hyoscyamine 0.125 mg 1 tab sublingual every 6 hours as needed abdominal pain -EGD to rule out H. Pylori gastritis/PUD/UGI malignancy. EGD benefits and risks discussed including risk with sedation, risk of bleeding, perforation and infection  -Await [facility] HIDA results -GERD diet -Patient present to the ED if she develops severe abdominal pain -Further recommendations to be determined after the above evaluation completed  2.  Liver cysts and a 1.5 cm hypoechoic mass in the right hepatic lobe, likely a hemangioma per abdominal sono.  Noncontrast CTAP identified multiple liver cysts measuring up to 12 mm to 6 mm in the right liver lobe. -I discussed scheduling an abdominal MRI with and without IV contrast to further clarify the 1.5 cm right liver mass, after the above evaluation completed  3.  Elevated  lipase level 133. Repeat lipase level 48.  -Eventual MRI of the abdomen as noted above  4.  History of 2 tubular adenomatous colon polyps per colonoscopy 08/2020 -Next colonoscopy due 08/2027

## 2021-01-15 NOTE — Progress Notes (Signed)
I agree with the above note, plan 

## 2021-02-14 ENCOUNTER — Other Ambulatory Visit: Payer: Self-pay

## 2021-02-14 ENCOUNTER — Ambulatory Visit (AMBULATORY_SURGERY_CENTER): Payer: 59 | Admitting: Gastroenterology

## 2021-02-14 ENCOUNTER — Encounter: Payer: Self-pay | Admitting: Gastroenterology

## 2021-02-14 VITALS — BP 116/66 | HR 57 | Temp 97.6°F | Resp 11 | Ht 67.0 in | Wt 184.0 lb

## 2021-02-14 DIAGNOSIS — K2950 Unspecified chronic gastritis without bleeding: Secondary | ICD-10-CM

## 2021-02-14 DIAGNOSIS — R1011 Right upper quadrant pain: Secondary | ICD-10-CM

## 2021-02-14 DIAGNOSIS — Z8619 Personal history of other infectious and parasitic diseases: Secondary | ICD-10-CM

## 2021-02-14 DIAGNOSIS — K2970 Gastritis, unspecified, without bleeding: Secondary | ICD-10-CM | POA: Diagnosis present

## 2021-02-14 MED ORDER — SODIUM CHLORIDE 0.9 % IV SOLN
500.0000 mL | Freq: Once | INTRAVENOUS | Status: DC
Start: 2021-02-14 — End: 2021-06-27

## 2021-02-14 NOTE — Op Note (Signed)
Rule Patient Name: Jennifer Velez Procedure Date: 02/14/2021 2:24 PM MRN: [id number] Endoscopist: Milus Banister , MD Age: 55 Referring MD:  Date of Birth: 15-Aug-1965 Gender: Female Account #: [id number] Procedure:                Upper GI endoscopy Indications:              Abdominal pain in the right upper quadrant Medicines:                Monitored Anesthesia Care Procedure:                Pre-Anesthesia Assessment:                           - Prior to the procedure, a History and Physical                            was performed, and patient medications and                            allergies were reviewed. The patient's tolerance of                            previous anesthesia was also reviewed. The risks                            and benefits of the procedure and the sedation                            options and risks were discussed with the patient.                            All questions were answered, and informed consent                            was obtained. Prior Anticoagulants: The patient has                            taken no previous anticoagulant or antiplatelet                            agents. ASA Grade Assessment: II - A patient with                            mild systemic disease. After reviewing the risks                            and benefits, the patient was deemed in                            satisfactory condition to undergo the procedure.                           After obtaining informed consent, the endoscope was  passed under direct vision. Throughout the                            procedure, the patient's blood pressure, pulse, and                            oxygen saturations were monitored continuously. The                            GIF [id number] was introduced through the                            mouth, and advanced to the second part of duodenum.                            The upper GI  endoscopy was accomplished without                            difficulty. The patient tolerated the procedure                            well. Scope In: Scope Out: Findings:                 Minimal inflammation characterized by erythema and                            granularity was found in the gastric antrum.                            Biopsies were taken with a cold forceps for                            histology.                           The exam was otherwise without abnormality. Complications:            No immediate complications. Estimated blood loss:                            None. Estimated Blood Loss:     Estimated blood loss: none. Impression:               - Minimal non-specific gastritis, biopsied to check                            for H. pylori                           - The examination was otherwise normal. Recommendation:           - Patient has a contact number available for                            emergencies. The signs and symptoms of potential  delayed complications were discussed with the                            patient. Return to normal activities tomorrow.                            Written discharge instructions were provided to the                            patient.                           - Resume previous diet.                           - Continue present medications.                           - Await pathology results. If pathology does not                            show persistent H. pylori then you will likely need                            further testing (possibly repeat CT or MR to follow                            up on the "Small focal region of geographic                            mesenteric stranding in the mid                           abdomen, associated reactive lymph nodes,                            appearance compatible with mesenteritis" that was                            described by the  radiologist that read your 10/2020                            CT scan Milus Banister, MD 02/14/2021 2:38:11 PM This report has been signed electronically.

## 2021-02-14 NOTE — Patient Instructions (Signed)
Information on gastritis given to you today.  Await pathology results.  Resume previous diet and medications.  YOU HAD AN ENDOSCOPIC PROCEDURE TODAY AT [facility]:   Refer to the procedure report that was given to you for any specific questions about what was found during the examination.  If the procedure report does not answer your questions, please call your gastroenterologist to clarify.  If you requested that your care partner not be given the details of your procedure findings, then the procedure report has been included in a sealed envelope for you to review at your convenience later.  YOU SHOULD EXPECT: Some feelings of bloating in the abdomen. Passage of more gas than usual.  Walking can help get rid of the air that was put into your GI tract during the procedure and reduce the bloating. If you had a lower endoscopy (such as a colonoscopy or flexible sigmoidoscopy) you may notice spotting of blood in your stool or on the toilet paper. If you underwent a bowel prep for your procedure, you may not have a normal bowel movement for a few days.  Please Note:  You might notice some irritation and congestion in your nose or some drainage.  This is from the oxygen used during your procedure.  There is no need for concern and it should clear up in a day or so.  SYMPTOMS TO REPORT IMMEDIATELY:  Following upper endoscopy (EGD)  Vomiting of blood or coffee ground material  New chest pain or pain under the shoulder blades  Painful or persistently difficult swallowing  New shortness of breath  Fever of 100F or higher  Black, tarry-looking stools  For urgent or emergent issues, a gastroenterologist can be reached at any hour by calling [phone]. Do not use MyChart messaging for urgent concerns.    DIET:  We do recommend a small meal at first, but then you may proceed to your regular diet.  Drink plenty of fluids but you should avoid alcoholic beverages for 24  hours.  ACTIVITY:  You should plan to take it easy for the rest of today and you should NOT DRIVE or use heavy machinery until tomorrow (because of the sedation medicines used during the test).    FOLLOW UP: Our staff will call the number listed on your records 48-72 hours following your procedure to check on you and address any questions or concerns that you may have regarding the information given to you following your procedure. If we do not reach you, we will leave a message.  We will attempt to reach you two times.  During this call, we will ask if you have developed any symptoms of COVID 19. If you develop any symptoms (ie: fever, flu-like symptoms, shortness of breath, cough etc.) before then, please call [phone].  If you test positive for Covid 19 in the 2 weeks post procedure, please call and report this information to [place].    If any biopsies were taken you will be contacted by phone or by letter within the next 1-3 weeks.  Please call us at [phone] if you have not heard about the biopsies in 3 weeks.    SIGNATURES/CONFIDENTIALITY: You and/or your care partner have signed paperwork which will be entered into your electronic medical record.  These signatures attest to the fact that that the information above on your After Visit Summary has been reviewed and is understood.  Full responsibility of the confidentiality of this discharge information lies with you  and/or your care-partner.

## 2021-02-14 NOTE — Progress Notes (Signed)
Check-in-joey  V/s-cw

## 2021-02-14 NOTE — Progress Notes (Signed)
Called to room to assist during endoscopic procedure.  Patient ID and intended procedure confirmed with present staff. Received instructions for my participation in the procedure from the performing physician.  

## 2021-02-14 NOTE — Progress Notes (Signed)
HPI: This is a woman with RUQ pains, extensive radiologic workup so far unreavelaing   ROS: complete GI ROS as described in HPI, all other review negative.  Constitutional:  No unintentional weight loss   Past Medical History:  Diagnosis Date   CIN III (cervical intraepithelial neoplasia III)    Endometriosis    Fibroid    GERD (gastroesophageal reflux disease)    IBS (irritable bowel syndrome)    Osteoporosis 05/2017   T score -2.0 prior DEXA 2016 T score -2.8 AP spine    Past Surgical History:  Procedure Laterality Date   CERVICAL BIOPSY  W/ LOOP ELECTRODE EXCISION  1999   COLPOSCOPY     EXPLORATORY LAPAROTOMY  2004   myomectomy and excision endometriosis   PELVIC LAPAROSCOPY     x2   SKIN CANCER EXCISION     Squamous cell    Current Outpatient Medications  Medication Sig Dispense Refill   pantoprazole (PROTONIX) 40 MG tablet Take 1 tablet (40 mg total) by mouth 2 (two) times daily before a meal. 60 tablet 3   Ascorbic Acid (VITAMIN C PO) Take 1,000 mg by mouth in the morning and at bedtime.      cholecalciferol (VITAMIN D3) 25 MCG (1000 UNIT) tablet Take 1,000 Units by mouth daily.     hyoscyamine (LEVSIN SL) 0.125 MG SL tablet Place 1 tablet (0.125 mg total) under the tongue every 6 (six) hours as needed. (Patient not taking: Reported on 02/14/2021) 30 tablet 0   IBUPROFEN PO Take 600 mg by mouth as needed.  (Patient not taking: Reported on 02/14/2021)     MAGNESIUM PO Take 500 mg by mouth at bedtime.  (Patient not taking: Reported on 02/14/2021)     Current Facility-Administered Medications  Medication Dose Route Frequency Provider Last Rate Last Admin   0.9 %  sodium chloride infusion  500 mL Intravenous Once Milus Banister, MD        Allergies as of 02/14/2021 - Review Complete 02/14/2021  Allergen Reaction Noted   Sulfonamide derivatives  12/19/2008    Family History  Problem Relation Age of Onset   Diverticulitis Mother    Alzheimer's disease Mother     Alzheimer's disease Maternal Aunt    Alzheimer's disease Maternal Aunt    Ulcerative colitis Maternal Aunt    Alzheimer's disease Maternal Grandmother    Ulcerative colitis Cousin    Colon cancer Neg Hx    Esophageal cancer Neg Hx    Rectal cancer Neg Hx    Stomach cancer Neg Hx    Colon polyps Neg Hx     Social History   Socioeconomic History   Marital status: Married    Spouse name: Not on file   Number of children: Not on file   Years of education: Not on file   Highest education level: Not on file  Occupational History   Occupation: Home maker  Tobacco Use   Smoking status: Never   Smokeless tobacco: Never  Vaping Use   Vaping Use: Never used  Substance and Sexual Activity   Alcohol use: No    Alcohol/week: 0.0 standard drinks   Drug use: No   Sexual activity: Not Currently    Comment: 1st intercourse 14 yo-5 partners  Other Topics Concern   Not on file  Social History Narrative   3 adopted children, 2 boys and 1 girl   Social Determinants of Radio broadcast assistant Strain: Not on file  Food Insecurity: Not  on file  Transportation Needs: Not on file  Physical Activity: Not on file  Stress: Not on file  Social Connections: Not on file  Intimate Partner Violence: Not on file     Physical Exam: BP 128/63 (Patient Position: Sitting)   Pulse 62   Temp 97.6 F (36.4 C)   Ht '5\' 7"'$  (1.702 m)   Wt 184 lb (83.5 kg)   LMP 11/02/2012   SpO2 96%   BMI 28.82 kg/m  Constitutional: generally well-appearing Psychiatric: alert and oriented x3 Lungs: CTA bilaterally Heart: no MCR  Assessment and plan: 55 y.o. female with RUQ  For EGD today  Care is appropriate for the ambulatory setting.  Owens Loffler, MD [facility] 02/14/2021, 1:51 PM

## 2021-02-14 NOTE — Progress Notes (Signed)
pt tolerated well. VSS. awake and to recovery. Report given to RN. Bit block left insitu to recovery.

## 2021-02-16 ENCOUNTER — Telehealth: Payer: Self-pay | Admitting: *Deleted

## 2021-02-16 NOTE — Telephone Encounter (Signed)
  Follow up Call-  Call back number 02/14/2021 08/07/2020  Post procedure Call Back phone  # [phone]  Permission to leave phone message Yes Yes  Some recent data might be hidden     Patient questions:  Do you have a fever, pain , or abdominal swelling? No. Pain Score  0 *  Have you tolerated food without any problems? Yes.    Have you been able to return to your normal activities? Yes.    Do you have any questions about your discharge instructions: Diet   No. Medications  No. Follow up visit  No.  Do you have questions or concerns about your Care? No.  Actions: * If pain score is 4 or above: No action needed, pain <4.Have you developed a fever since your procedure? no  2.   Have you had an respiratory symptoms (SOB or cough) since your procedure? no  3.   Have you tested positive for COVID 19 since your procedure no  4.   Have you had any family members/close contacts diagnosed with the COVID 19 since your procedure?  no   If yes to any of these questions please route to Joylene John, RN and Joella Prince, RN

## 2021-02-21 ENCOUNTER — Other Ambulatory Visit: Payer: Self-pay

## 2021-02-21 DIAGNOSIS — R9389 Abnormal findings on diagnostic imaging of other specified body structures: Secondary | ICD-10-CM

## 2021-02-21 DIAGNOSIS — R109 Unspecified abdominal pain: Secondary | ICD-10-CM

## 2021-02-22 ENCOUNTER — Telehealth: Payer: Self-pay | Admitting: Gastroenterology

## 2021-02-22 NOTE — Telephone Encounter (Signed)
Patient is returning your call regarding CT

## 2021-02-22 NOTE — Telephone Encounter (Signed)
See results note dated 8/24 

## 2021-03-01 ENCOUNTER — Ambulatory Visit (HOSPITAL_COMMUNITY)
Admission: RE | Admit: 2021-03-01 | Discharge: 2021-03-01 | Disposition: A | Discharge status: Home / Self Care | Payer: 59 | Source: Ambulatory Visit | Attending: Gastroenterology | Admitting: Gastroenterology

## 2021-03-01 ENCOUNTER — Other Ambulatory Visit: Payer: Self-pay

## 2021-03-01 ENCOUNTER — Encounter (HOSPITAL_COMMUNITY): Payer: Self-pay

## 2021-03-01 DIAGNOSIS — R109 Unspecified abdominal pain: Secondary | ICD-10-CM | POA: Diagnosis not present

## 2021-03-01 DIAGNOSIS — R9389 Abnormal findings on diagnostic imaging of other specified body structures: Secondary | ICD-10-CM | POA: Diagnosis present

## 2021-03-01 MED ORDER — IOHEXOL 350 MG/ML SOLN
75.0000 mL | Freq: Once | INTRAVENOUS | Status: AC | PRN
  Administered 2021-03-01: 75 mL via INTRAVENOUS

## 2021-04-24 IMAGING — MG DIGITAL SCREENING BILAT W/ TOMO W/ CAD
8 series · 8 of 24 positions shown · non-contrast
Comparison: Previous exam(s).

CLINICAL DATA: Screening.

EXAM:
DIGITAL SCREENING BILATERAL MAMMOGRAM WITH TOMO AND CAD

[R CC synth-2D]
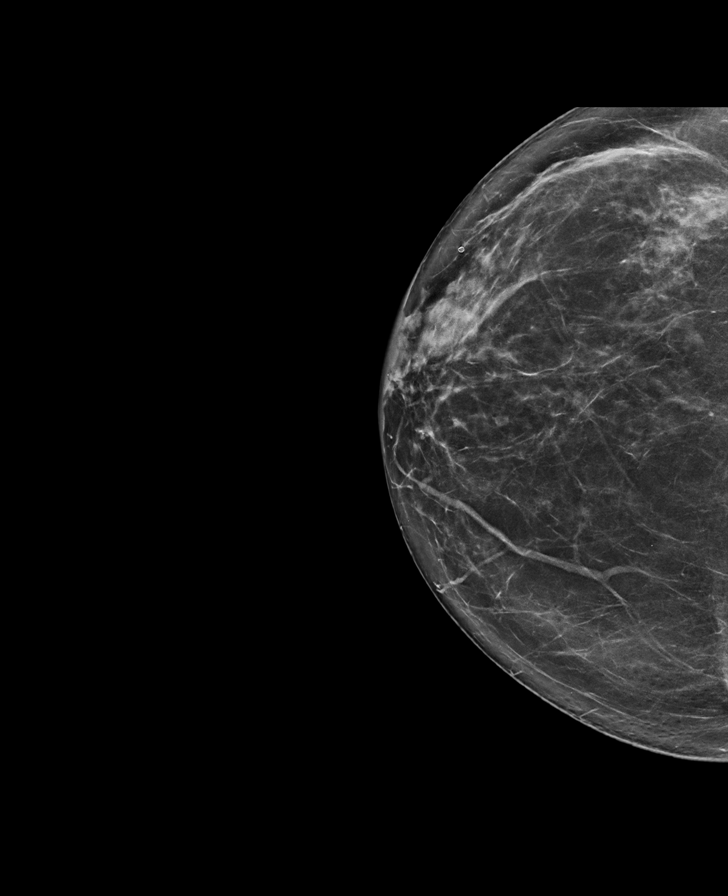

[L MLO synth-2D]
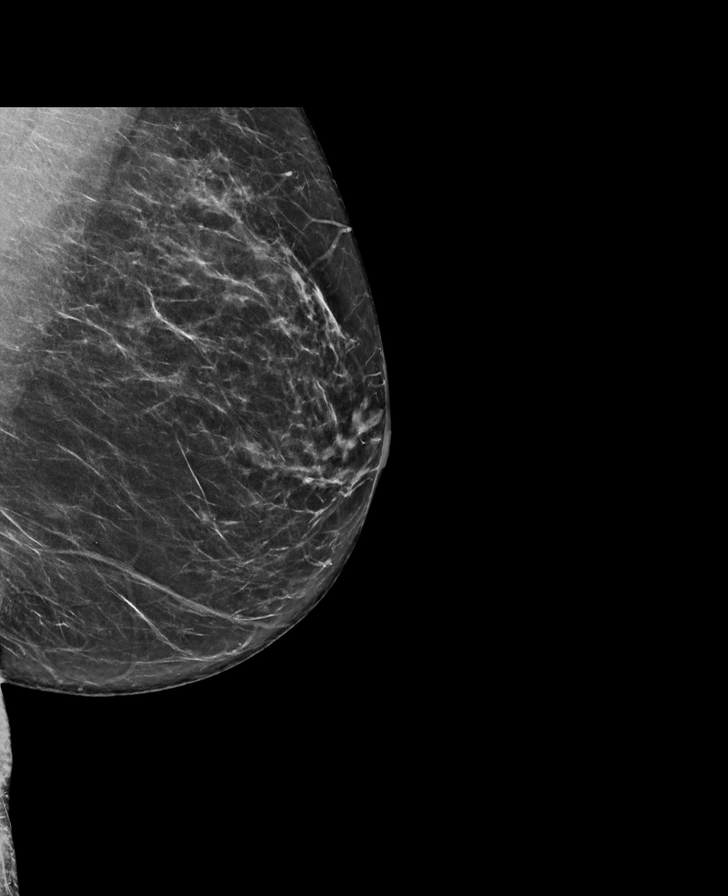

[R MLO synth-2D]
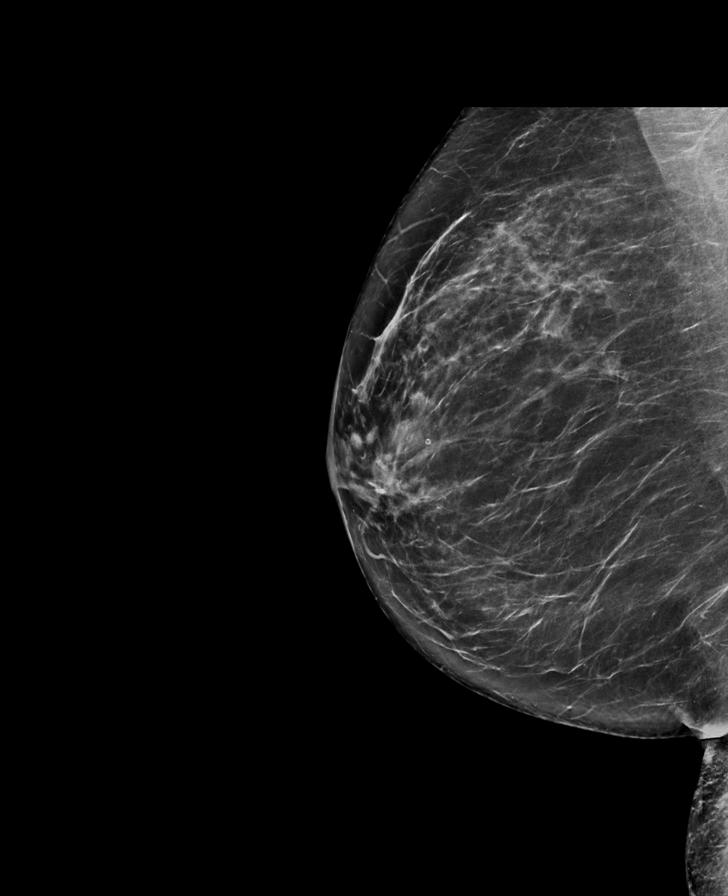

[L CC synth-2D]
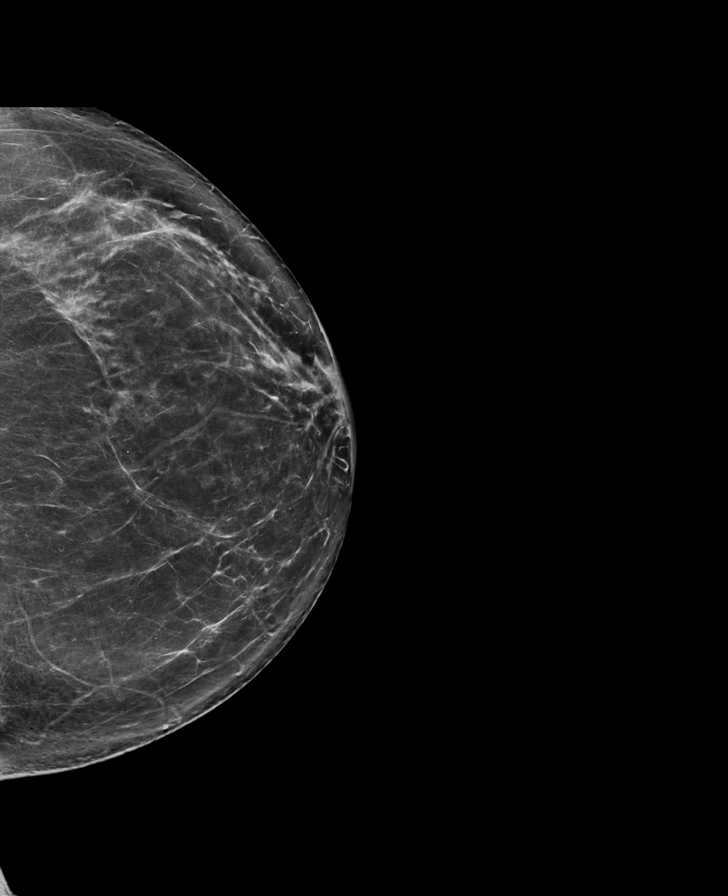

[L MLO tomo · tomo slice 38/75.0]
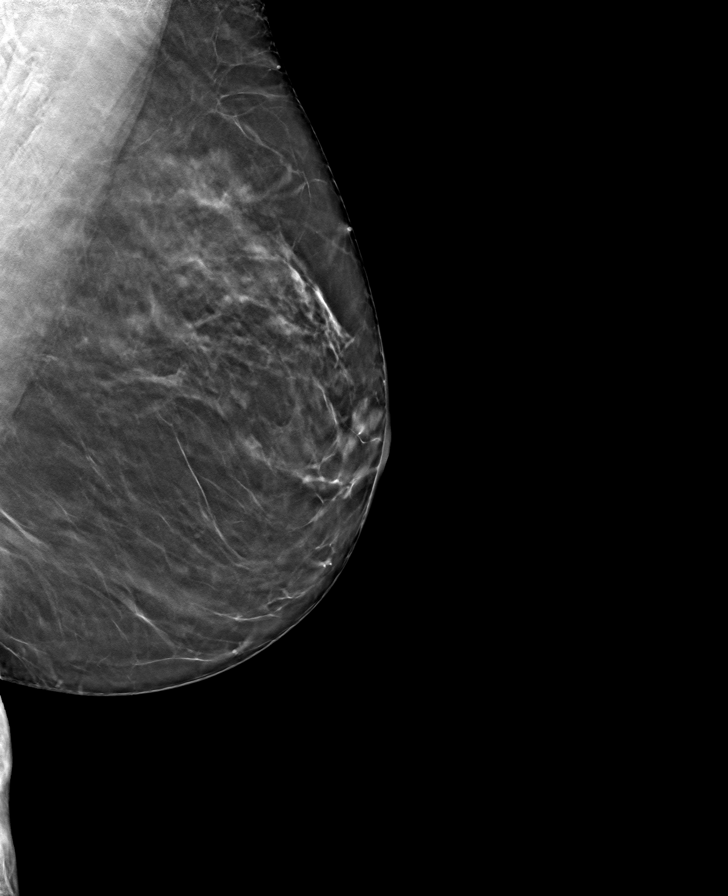

[R MLO tomo · tomo slice 39/78.0]
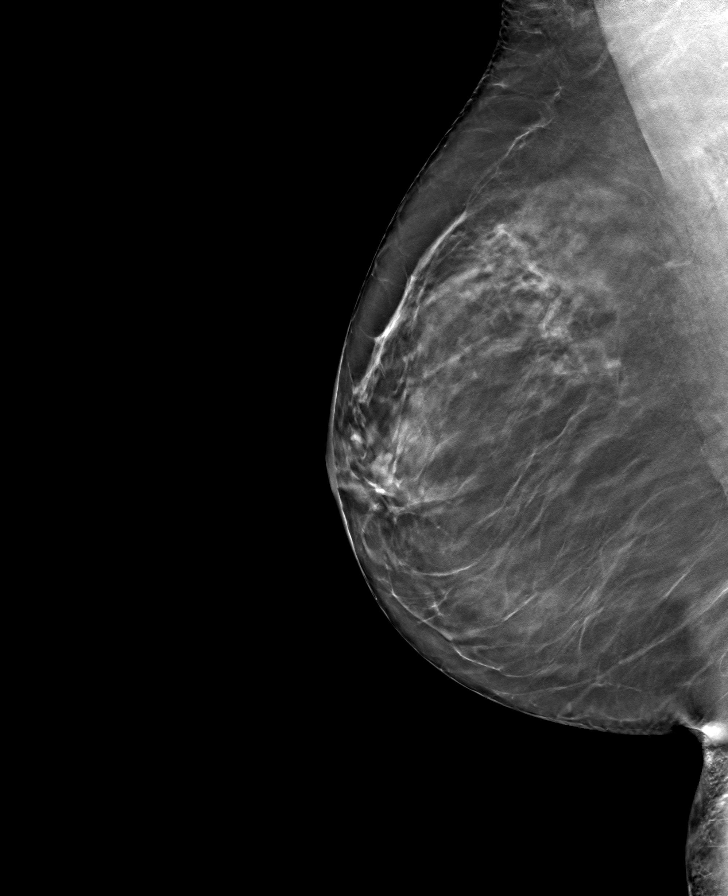

[R CC tomo · tomo slice 39/77.0]
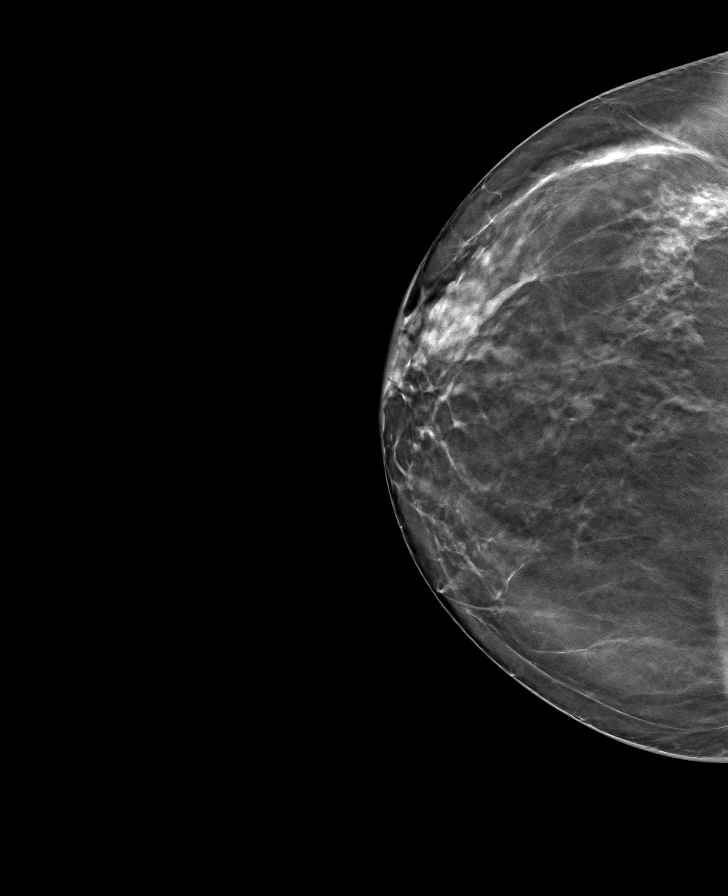

[L CC tomo · tomo slice 41/80.0]
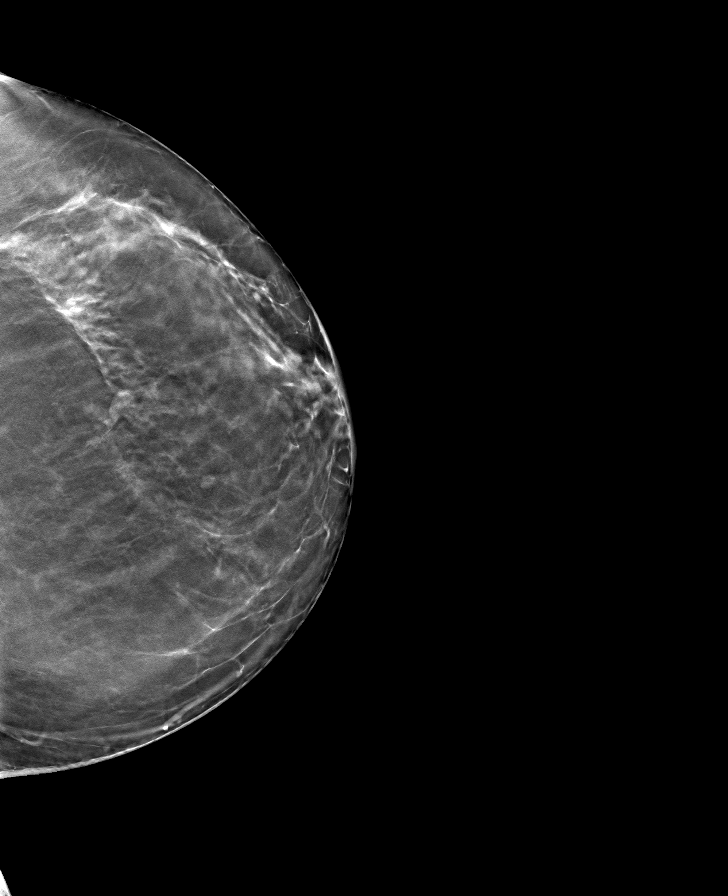

[8 of 24 positions shown; findings below may reference images not displayed]

ACR Breast Density Category b: There are scattered areas of
fibroglandular density.
FINDINGS: There are no findings suspicious for malignancy. Images were
processed with CAD.
IMPRESSION: No mammographic evidence of malignancy. A result letter of this
screening mammogram will be mailed directly to the patient.

RECOMMENDATION:
Screening mammogram in one year. (Code:[id number])

BI-RADS CATEGORY  1: Negative.

## 2021-05-01 ENCOUNTER — Other Ambulatory Visit: Payer: Self-pay | Admitting: Obstetrics and Gynecology

## 2021-05-01 DIAGNOSIS — Z1231 Encounter for screening mammogram for malignant neoplasm of breast: Secondary | ICD-10-CM

## 2021-05-02 ENCOUNTER — Encounter: Payer: Self-pay | Admitting: Gastroenterology

## 2021-05-02 ENCOUNTER — Ambulatory Visit: Payer: 59 | Admitting: Gastroenterology

## 2021-05-02 ENCOUNTER — Other Ambulatory Visit (INDEPENDENT_AMBULATORY_CARE_PROVIDER_SITE_OTHER): Payer: 59

## 2021-05-02 VITALS — BP 122/70 | HR 75 | Ht 67.0 in | Wt 184.2 lb

## 2021-05-02 DIAGNOSIS — R1011 Right upper quadrant pain: Secondary | ICD-10-CM | POA: Diagnosis not present

## 2021-05-02 LAB — COMPREHENSIVE METABOLIC PANEL
ALT: 16 U/L (ref 0–35)
AST: 17 U/L (ref 0–37)
Albumin: 4.4 g/dL (ref 3.5–5.2)
Alkaline Phosphatase: 69 U/L (ref 39–117)
BUN: 15 mg/dL (ref 6–23)
CO2: 32 mEq/L (ref 19–32)
Calcium: 9.3 mg/dL (ref 8.4–10.5)
Chloride: 104 mEq/L (ref 96–112)
Creatinine, Ser: 0.74 mg/dL (ref 0.40–1.20)
GFR: 90.16 mL/min (ref >60.00)
Glucose, Bld: 134 mg/dL — ABNORMAL HIGH (ref 70–99)
Potassium: 3.9 mEq/L (ref 3.5–5.1)
Sodium: 141 mEq/L (ref 135–145)
Total Bilirubin: 0.5 mg/dL (ref 0.2–1.2)
Total Protein: 7.1 g/dL (ref 6.0–8.3)

## 2021-05-02 LAB — LIPASE: Lipase: 46 U/L (ref 11.0–59.0)

## 2021-05-02 LAB — AMYLASE: Amylase: 39 U/L (ref 27–131)

## 2021-05-02 NOTE — Patient Instructions (Signed)
If you are age 57 or younger, your body mass index should be between 19-25. Your Body mass index is 28.86 kg/m. If this is out of the aformentioned range listed, please consider follow up with your Primary Care Provider.   ________________________________________________________  The [facility] providers would like to encourage you to use [facility] to communicate with providers for non-urgent requests or questions.  Due to long hold times on the telephone, sending your provider a message by [facility] may be a faster and more efficient way to get a response.  Please allow 48 business hours for a response.  Please remember that this is for non-urgent requests.  _______________________________________________________  Your provider has requested that you go to the basement level for lab work before leaving today. Press "B" on the elevator. The lab is located at the first door on the left as you exit the elevator.  Due to recent changes in healthcare laws, you may see the results of your imaging and laboratory studies on MyChart before your provider has had a chance to review them.  We understand that in some cases there may be results that are confusing or concerning to you. Not all laboratory results come back in the same time frame and the provider may be waiting for multiple results in order to interpret others.  Please give [place] 48 hours in order for your provider to thoroughly review all the results before contacting the office for clarification of your results.   Thank you for entrusting me with your care and choosing [facility].  Dr Ardis Hughs

## 2021-05-02 NOTE — Progress Notes (Signed)
Review of pertinent gastrointestinal problems: 1.  History of precancerous colon polyps.  Colonoscopy 2016 2 subcentimeter adenomas.  Repeat colonoscopy February 2022 done for minor rectal bleeding.  2 subcentimeter polyps were again found.  Diverticulosis in the left colon.  The polyps were adenomatous.  She was recommended to have repeat colonoscopy at 7-year interval 2.  Right upper quadrant pain work-up 2022.  Blood work June 2022 lipase is slightly elevated 133 and then normalized 1 month later.  CBC normal, complete metabolic profile normal, amylase normal.  EGD August 2022 minimal distal gastritis.  Biopsies were negative for H. pylori infection.  CT scan abdomen pelvis WITHOUT IV CONTRAST May 2022 "small focal region of geographic mesenteric stranding in the mid abdomen, associated reactive lymph nodes, appearance compatible with mesenteritis".  Abdominal ultrasound June 2022 cysts in the liver.  Otherwise examination was normal including a normal gallbladder.  HIDA scan July 2022 was normal.  Repeat CT scan abd/pelvis WITH IV CONTRAST September 2022 did not explain her symptoms.  HPI: This is a very pleasant 55 year old woman  Her weight is unchanged over 3 and half months, same scale here in our office.  Today she tells me that the right upper quadrant pain somewhat improved.  She was having horrible sharp excruciating intermittent pains but those have gone.  Now she has some soreness in her right upper quadrant.  No nausea at all.  She is not limited by eating.  She has no weight loss.  She has not had any NSAIDs.  She tried proton pump inhibitors for several weeks and that did not make any difference.  ROS: complete GI ROS as described in HPI, all other review negative.  Constitutional:  No unintentional weight loss   Past Medical History:  Diagnosis Date   CIN III (cervical intraepithelial neoplasia III)    Endometriosis    Fibroid    GERD (gastroesophageal reflux disease)    IBS  (irritable bowel syndrome)    Osteoporosis 05/2017   T score -2.0 prior DEXA 2016 T score -2.8 AP spine    Past Surgical History:  Procedure Laterality Date   CERVICAL BIOPSY  W/ LOOP ELECTRODE EXCISION  1999   COLPOSCOPY     EXPLORATORY LAPAROTOMY  2004   myomectomy and excision endometriosis   PELVIC LAPAROSCOPY     x2   SKIN CANCER EXCISION     Squamous cell    Current Outpatient Medications  Medication Sig Dispense Refill   Ascorbic Acid (VITAMIN C PO) Take 1,000 mg by mouth in the morning and at bedtime.      cholecalciferol (VITAMIN D3) 25 MCG (1000 UNIT) tablet Take 1,000 Units by mouth daily.     IBUPROFEN PO Take 600 mg by mouth as needed.     MAGNESIUM PO Take 500 mg by mouth at bedtime.     Current Facility-Administered Medications  Medication Dose Route Frequency Provider Last Rate Last Admin   0.9 %  sodium chloride infusion  500 mL Intravenous Once Milus Banister, MD        Allergies as of 05/02/2021 - Review Complete 05/02/2021  Allergen Reaction Noted   Sulfonamide derivatives  12/19/2008    Family History  Problem Relation Age of Onset   Diverticulitis Mother    Alzheimer's disease Mother    Alzheimer's disease Maternal Aunt    Alzheimer's disease Maternal Aunt    Ulcerative colitis Maternal Aunt    Alzheimer's disease Maternal Grandmother    Ulcerative colitis Cousin  Colon cancer Neg Hx    Esophageal cancer Neg Hx    Rectal cancer Neg Hx    Stomach cancer Neg Hx    Colon polyps Neg Hx     Social History   Socioeconomic History   Marital status: Married    Spouse name: Not on file   Number of children: Not on file   Years of education: Not on file   Highest education level: Not on file  Occupational History   Occupation: Home maker  Tobacco Use   Smoking status: Never   Smokeless tobacco: Never  Vaping Use   Vaping Use: Never used  Substance and Sexual Activity   Alcohol use: No    Alcohol/week: 0.0 standard drinks   Drug use:  No   Sexual activity: Not Currently    Comment: 1st intercourse 89 yo-5 partners  Other Topics Concern   Not on file  Social History Narrative   3 adopted children, 2 boys and 1 girl   Social Determinants of Radio broadcast assistant Strain: Not on file  Food Insecurity: Not on file  Transportation Needs: Not on file  Physical Activity: Not on file  Stress: Not on file  Social Connections: Not on file  Intimate Partner Violence: Not on file     Physical Exam: BP 122/70   Pulse 75   Ht 5\' 7"  (1.702 m)   Wt 184 lb 4 oz (83.6 kg)   LMP 11/02/2012   SpO2 98%   BMI 28.86 kg/m  Constitutional: generally well-appearing Psychiatric: alert and oriented x3 Abdomen: soft, nontender, nondistended, no obvious ascites, no peritoneal signs, normal bowel sounds No peripheral edema noted in lower extremities  Assessment and plan: 55 y.o. female with right upper quadrant pain  We discussed the fact that we do not have a clear etiology of her pains.  She understands that gallbladder pathology can sometimes be quite difficult to prove.  Her pains have gradually gotten better however they are not completely gone.  I offered further imaging with MR however she would like to hold on that for now.  Instead we will get blood work including complete metabolic profile, amylase, lipase.  She knows to call if the pains return or if she decides to go ahead with further testing which would be MRI with MRCP.  Please see the "Patient Instructions" section for addition details about the plan.  Owens Loffler, MD [facility] 05/02/2021, 9:12 AM   Total time on date of encounter was 35 minutes (this included time spent preparing to see the patient reviewing records; obtaining and/or reviewing separately obtained history; performing a medically appropriate exam and/or evaluation; counseling and educating the patient and family if present; ordering medications, tests or procedures if applicable;  and documenting clinical information in the health record).

## 2021-05-28 ENCOUNTER — Other Ambulatory Visit: Payer: Self-pay | Admitting: Family Medicine

## 2021-05-28 MED ORDER — AMOXICILLIN 875 MG PO TABS: 875.0000 mg | ORAL_TABLET | Freq: Two times a day (BID) | ORAL | 0 refills | Status: DC

## 2021-06-13 ENCOUNTER — Ambulatory Visit
Admission: RE | Admit: 2021-06-13 | Discharge: 2021-06-13 | Disposition: A | Discharge status: Home / Self Care | Payer: 59 | Source: Ambulatory Visit | Attending: Obstetrics and Gynecology | Admitting: Obstetrics and Gynecology

## 2021-06-13 DIAGNOSIS — Z1231 Encounter for screening mammogram for malignant neoplasm of breast: Secondary | ICD-10-CM

## 2021-06-15 NOTE — Progress Notes (Signed)
55 y.o. G3P0010 Married Caucasian female here for annual exam.    No GYN concerns.   Dealing with right upper quadrant pain.  Had H Pylori.  Seeing GI.  Has a lot of stress and hair is falling out.   Has a farm.  3 adopted children.  2 special needs children:  Down's syndrome and one child with cleft lip and palate. Husband retired.  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  PCP:   Frederico Hamman Copland  Patient's last menstrual period was 11/02/2012.           Sexually active: Yes.    The current method of family planning is post menopausal status.    Exercising: Yes.     walking Smoker:  no  Health Maintenance: Pap:  06-10-18 neg, 05-14-16 neg HPV HR neg, 05-02-15 neg History of abnormal Pap:  yes, hx of LEEP MMG:  06-13-21 category b density birads 1:neg Colonoscopy:  08-07-20 BMD:   07-13-19  Result  osteopenia f/u 2 yrs.  Off Fosamax in 2021.  TDaP:  2010, unsure if updated Gardasil:   n/a HIV: neg 04-27-15 Hep C: unsure Screening Labs:  PCP. Flu vaccine:  completed.    reports that she has never smoked. She has never used smokeless tobacco. She reports that she does not drink alcohol and does not use drugs.  Past Medical History:  Diagnosis Date   CIN III (cervical intraepithelial neoplasia III)    Endometriosis    Fibroid    GERD (gastroesophageal reflux disease)    IBS (irritable bowel syndrome)    Osteoporosis 05/2017   T score -2.0 prior DEXA 2016 T score -2.8 AP spine    Past Surgical History:  Procedure Laterality Date   CERVICAL BIOPSY  W/ LOOP ELECTRODE EXCISION  1999   COLPOSCOPY     EXPLORATORY LAPAROTOMY  2004   myomectomy and excision endometriosis   PELVIC LAPAROSCOPY     x2   SKIN CANCER EXCISION     Squamous cell    Current Outpatient Medications  Medication Sig Dispense Refill   Ascorbic Acid (VITAMIN C PO) Take 1,000 mg by mouth in the morning and at bedtime.      cholecalciferol (VITAMIN D3) 25 MCG (1000 UNIT) tablet Take 1,000 Units by  mouth daily.     IBUPROFEN PO Take 600 mg by mouth as needed.     MAGNESIUM PO Take 500 mg by mouth at bedtime.     No current facility-administered medications for this visit.    Family History  Problem Relation Age of Onset   Diverticulitis Mother    Alzheimer's disease Mother    Alzheimer's disease Maternal Aunt    Alzheimer's disease Maternal Aunt    Ulcerative colitis Maternal Aunt    Alzheimer's disease Maternal Grandmother    Ulcerative colitis Cousin    Colon cancer Neg Hx    Esophageal cancer Neg Hx    Rectal cancer Neg Hx    Stomach cancer Neg Hx    Colon polyps Neg Hx     Review of Systems  Constitutional: Negative.   HENT: Negative.    Eyes: Negative.   Respiratory: Negative.    Cardiovascular: Negative.   Gastrointestinal:  Positive for abdominal pain.  Endocrine: Negative.   Genitourinary: Negative.   Musculoskeletal: Negative.   Skin: Negative.   Allergic/Immunologic: Negative.   Neurological: Negative.   Hematological: Negative.   Psychiatric/Behavioral: Negative.     Exam:   BP 114/68    Pulse 73  Resp 16    Ht 5' 7.75" (1.721 m)    Wt 185 lb (83.9 kg)    LMP 11/02/2012    BMI 28.34 kg/m     General appearance: alert, cooperative and appears stated age Head: normocephalic, without obvious abnormality, atraumatic Neck: no adenopathy, supple, symmetrical, trachea midline and thyroid normal to inspection and palpation Lungs: clear to auscultation bilaterally Breasts: normal appearance, no masses or tenderness, No nipple retraction or dimpling, No nipple discharge or bleeding, No axillary adenopathy Heart: regular rate and rhythm Abdomen: soft, non-tender; no masses, no organomegaly Extremities: extremities normal, atraumatic, no cyanosis or edema Skin: skin color, texture, turgor normal. No rashes or lesions Lymph nodes: cervical, supraclavicular, and axillary nodes normal. Neurologic: grossly normal  Pelvic: External genitalia:  no lesions               No abnormal inguinal nodes palpated.              Urethra:  normal appearing urethra with no masses, tenderness or lesions              Bartholins and Skenes: normal                 Vagina: normal appearing vagina with normal color and discharge, no lesions              Cervix: no lesions.  Polyp noted and removed with ring forceps after verbal permission given.              Pap taken: yes Bimanual Exam:  Uterus:  normal size, contour, position, consistency, mobility, non-tender              Adnexa: no mass, fullness, tenderness              Rectal exam: yes.  Confirms.              Anus:  normal sphincter tone, no lesions  Chaperone was present for exam:  Joy, CMA  Assessment:   Well woman visit with gynecologic exam. Hx CIN III.   Status post LEEP in 1999. Cervical polyp.  Removed today. Osteopenia.  Off Fosamax for about 2 years.   Plan: Mammogram screening discussed. Self breast awareness reviewed. Pap and HR HPV as above. Guidelines for Calcium, Vitamin D, regular exercise program including cardiovascular and weight bearing exercise. FU pathology on cervical polyp Schedule BMD for this office.  Follow up annually and prn.   After visit summary provided.

## 2021-06-27 ENCOUNTER — Encounter: Payer: Self-pay | Admitting: Obstetrics and Gynecology

## 2021-06-27 ENCOUNTER — Other Ambulatory Visit (HOSPITAL_COMMUNITY)
Admission: RE | Admit: 2021-06-27 | Discharge: 2021-06-27 | Disposition: A | Discharge status: Home / Self Care | Payer: 59 | Source: Ambulatory Visit | Attending: Obstetrics & Gynecology | Admitting: Obstetrics & Gynecology

## 2021-06-27 ENCOUNTER — Ambulatory Visit (INDEPENDENT_AMBULATORY_CARE_PROVIDER_SITE_OTHER): Payer: 59 | Admitting: Obstetrics and Gynecology

## 2021-06-27 ENCOUNTER — Other Ambulatory Visit (HOSPITAL_COMMUNITY)
Admission: RE | Admit: 2021-06-27 | Discharge: 2021-06-27 | Disposition: A | Discharge status: Home / Self Care | Payer: 59 | Source: Ambulatory Visit | Attending: Obstetrics and Gynecology | Admitting: Obstetrics and Gynecology

## 2021-06-27 ENCOUNTER — Other Ambulatory Visit: Payer: Self-pay

## 2021-06-27 VITALS — BP 114/68 | HR 73 | Resp 16 | Ht 67.75 in | Wt 185.0 lb

## 2021-06-27 DIAGNOSIS — Z87410 Personal history of cervical dysplasia: Secondary | ICD-10-CM | POA: Insufficient documentation

## 2021-06-27 DIAGNOSIS — N841 Polyp of cervix uteri: Secondary | ICD-10-CM | POA: Insufficient documentation

## 2021-06-27 DIAGNOSIS — M8580 Other specified disorders of bone density and structure, unspecified site: Secondary | ICD-10-CM

## 2021-06-27 DIAGNOSIS — Z01419 Encounter for gynecological examination (general) (routine) without abnormal findings: Secondary | ICD-10-CM | POA: Diagnosis not present

## 2021-06-27 NOTE — Patient Instructions (Signed)

## 2021-06-28 ENCOUNTER — Other Ambulatory Visit: Payer: Self-pay | Admitting: *Deleted

## 2021-06-28 DIAGNOSIS — M8580 Other specified disorders of bone density and structure, unspecified site: Secondary | ICD-10-CM

## 2021-06-28 LAB — CYTOLOGY - PAP
Comment: NEGATIVE
Diagnosis: NEGATIVE
High risk HPV: NEGATIVE

## 2021-07-01 LAB — SURGICAL PATHOLOGY

## 2021-07-05 ENCOUNTER — Ambulatory Visit: Payer: 59 | Admitting: Obstetrics & Gynecology

## 2021-07-26 ENCOUNTER — Other Ambulatory Visit: Payer: Self-pay

## 2021-07-26 ENCOUNTER — Encounter: Payer: Self-pay | Admitting: Gastroenterology

## 2021-07-26 DIAGNOSIS — R1011 Right upper quadrant pain: Secondary | ICD-10-CM

## 2021-09-25 ENCOUNTER — Other Ambulatory Visit: Payer: Self-pay

## 2021-09-25 ENCOUNTER — Other Ambulatory Visit: Payer: Self-pay | Admitting: Obstetrics and Gynecology

## 2021-09-25 ENCOUNTER — Ambulatory Visit (INDEPENDENT_AMBULATORY_CARE_PROVIDER_SITE_OTHER): Payer: 59

## 2021-09-25 DIAGNOSIS — M8589 Other specified disorders of bone density and structure, multiple sites: Secondary | ICD-10-CM | POA: Diagnosis not present

## 2021-09-25 DIAGNOSIS — Z780 Asymptomatic menopausal state: Secondary | ICD-10-CM

## 2021-09-25 DIAGNOSIS — M8580 Other specified disorders of bone density and structure, unspecified site: Secondary | ICD-10-CM

## 2022-03-12 NOTE — Progress Notes (Unsigned)
56 y.o. G63P0010 Married Caucasian female here for annual exam.    PCP: Owens Loffler, MD  Patient's last menstrual period was 11/02/2012.           Sexually active: {yes no:314532}  The current method of family planning is post menopausal status.    Exercising: {yes no:314532}  {types:19826} Smoker:  no  Health Maintenance: Pap:  112-28-22 Neg:Neg HR HPV, 08-11-17 neg, 05-14-16 neg HPV HR neg, 05-02-15 neg History of abnormal Pap:  yes, hx of LEEP MMG:  06-13-21 Neg/BiRads1 Colonoscopy:  *** BMD:  09-25-21  Result :Osteopenia--stable TDaP:  ***2010 Gardasil:   no HIV: 04-27-15 Neg Hep C: Unsure Screening Labs:  Hb today: ***, Urine today: ***   reports that she has never smoked. She has never used smokeless tobacco. She reports that she does not drink alcohol and does not use drugs.  Past Medical History:  Diagnosis Date   CIN III (cervical intraepithelial neoplasia III)    Endometriosis    Fibroid    GERD (gastroesophageal reflux disease)    IBS (irritable bowel syndrome)    Osteoporosis 05/2017   T score -2.0 prior DEXA 2016 T score -2.8 AP spine    Past Surgical History:  Procedure Laterality Date   CERVICAL BIOPSY  W/ LOOP ELECTRODE EXCISION  1999   COLPOSCOPY     EXPLORATORY LAPAROTOMY  2004   myomectomy and excision endometriosis   PELVIC LAPAROSCOPY     x2   SKIN CANCER EXCISION     Squamous cell    Current Outpatient Medications  Medication Sig Dispense Refill   Ascorbic Acid (VITAMIN C PO) Take 1,000 mg by mouth in the morning and at bedtime.      cholecalciferol (VITAMIN D3) 25 MCG (1000 UNIT) tablet Take 1,000 Units by mouth daily.     IBUPROFEN PO Take 600 mg by mouth as needed.     MAGNESIUM PO Take 500 mg by mouth at bedtime.     No current facility-administered medications for this visit.    Family History  Problem Relation Age of Onset   Diverticulitis Mother    Alzheimer's disease Mother    Alzheimer's disease Maternal Aunt     Alzheimer's disease Maternal Aunt    Ulcerative colitis Maternal Aunt    Alzheimer's disease Maternal Grandmother    Ulcerative colitis Cousin    Colon cancer Neg Hx    Esophageal cancer Neg Hx    Rectal cancer Neg Hx    Stomach cancer Neg Hx    Colon polyps Neg Hx     Review of Systems  Exam:   LMP 11/02/2012     General appearance: alert, cooperative and appears stated age Head: normocephalic, without obvious abnormality, atraumatic Neck: no adenopathy, supple, symmetrical, trachea midline and thyroid normal to inspection and palpation Lungs: clear to auscultation bilaterally Breasts: normal appearance, no masses or tenderness, No nipple retraction or dimpling, No nipple discharge or bleeding, No axillary adenopathy Heart: regular rate and rhythm Abdomen: soft, non-tender; no masses, no organomegaly Extremities: extremities normal, atraumatic, no cyanosis or edema Skin: skin color, texture, turgor normal. No rashes or lesions Lymph nodes: cervical, supraclavicular, and axillary nodes normal. Neurologic: grossly normal  Pelvic: External genitalia:  no lesions              No abnormal inguinal nodes palpated.              Urethra:  normal appearing urethra with no masses, tenderness or lesions  Bartholins and Skenes: normal                 Vagina: normal appearing vagina with normal color and discharge, no lesions              Cervix: no lesions              Pap taken: {yes no:314532} Bimanual Exam:  Uterus:  normal size, contour, position, consistency, mobility, non-tender              Adnexa: no mass, fullness, tenderness              Rectal exam: {yes no:314532}.  Confirms.              Anus:  normal sphincter tone, no lesions  Chaperone was present for exam:  ***  Assessment:   Well woman visit with gynecologic exam.   Plan: Mammogram screening discussed. Self breast awareness reviewed. Pap and HR HPV as above. Guidelines for Calcium, Vitamin D,  regular exercise program including cardiovascular and weight bearing exercise.   Follow up annually and prn.   Additional counseling given.  {yes [id number]. _______ minutes face to face time of which over 50% was spent in counseling.    After visit summary provided.

## 2022-03-13 ENCOUNTER — Encounter: Payer: Self-pay | Admitting: Obstetrics and Gynecology

## 2022-03-13 ENCOUNTER — Ambulatory Visit (INDEPENDENT_AMBULATORY_CARE_PROVIDER_SITE_OTHER): Payer: 59 | Admitting: Obstetrics and Gynecology

## 2022-03-13 ENCOUNTER — Ambulatory Visit (INDEPENDENT_AMBULATORY_CARE_PROVIDER_SITE_OTHER): Payer: 59

## 2022-03-13 VITALS — BP 112/68 | HR 72 | Ht 67.5 in | Wt 187.0 lb

## 2022-03-13 DIAGNOSIS — Z01419 Encounter for gynecological examination (general) (routine) without abnormal findings: Secondary | ICD-10-CM

## 2022-03-13 DIAGNOSIS — Z23 Encounter for immunization: Secondary | ICD-10-CM | POA: Diagnosis not present

## 2022-03-13 DIAGNOSIS — N9089 Other specified noninflammatory disorders of vulva and perineum: Secondary | ICD-10-CM

## 2022-03-13 DIAGNOSIS — R350 Frequency of micturition: Secondary | ICD-10-CM

## 2022-03-13 NOTE — Patient Instructions (Signed)

## 2022-03-15 LAB — URINALYSIS, COMPLETE W/RFL CULTURE
Bilirubin Urine: NEGATIVE
Glucose, UA: NEGATIVE
Hgb urine dipstick: NEGATIVE
Hyaline Cast: NONE SEEN /LPF
Ketones, ur: NEGATIVE
Nitrites, Initial: NEGATIVE
Protein, ur: NEGATIVE
Specific Gravity, Urine: 1.022 (ref 1.001–1.035)
pH: 5 (ref 5.0–8.0)

## 2022-03-15 LAB — URINE CULTURE
MICRO NUMBER:: 13911458
SPECIMEN QUALITY:: ADEQUATE

## 2022-03-15 LAB — CULTURE INDICATED

## 2022-03-18 NOTE — Progress Notes (Unsigned)
GYNECOLOGY  VISIT   HPI: 56 y.o.   Married  Caucasian  female   G1P0010 with Patient's last menstrual period was 11/02/2012.   here for vulvar biopsy.   Having rectal pain today.  Has rectal bleeding.  She has been seeing her GI for right upper quadrant abdominal pain and hemorrhoids. She will contact her GI for care for the rectal pain and bleeding.   Patient is having bloating, constipation, and pain in her left side.  Patient is worried about ovarian cancer.   GYNECOLOGIC HISTORY: Patient's last menstrual period was 11/02/2012. Contraception:  PMP Menopausal hormone therapy:  none Last mammogram:  06-13-21 Neg/BiRads1 Last pap smear:    06-27-21 Neg:Neg HR HPV, 08-11-17 neg, 05-14-16 neg HPV HR neg, 05-02-15 neg        OB History     Gravida  1   Para  0   Term  0   Preterm  0   AB  1   Living  0      SAB      IAB      Ectopic      Multiple      Live Births                 Patient Active Problem List   Diagnosis Date Noted   IBS (irritable bowel syndrome)    CIN III (cervical intraepithelial neoplasia III)    DIVERTICULOSIS OF COLON 06/19/2007   Rectal bleeding 06/19/2007   ENDOMETRIOSIS 06/19/2007    Past Medical History:  Diagnosis Date   CIN III (cervical intraepithelial neoplasia III)    Endometriosis    Fibroid    GERD (gastroesophageal reflux disease)    IBS (irritable bowel syndrome)    Osteoporosis 05/2017   T score -2.0 prior DEXA 2016 T score -2.8 AP spine    Past Surgical History:  Procedure Laterality Date   CERVICAL BIOPSY  W/ LOOP ELECTRODE EXCISION  1999   COLPOSCOPY     EXPLORATORY LAPAROTOMY  2004   myomectomy and excision endometriosis   PELVIC LAPAROSCOPY     x2   SKIN CANCER EXCISION     Squamous cell    Current Outpatient Medications  Medication Sig Dispense Refill   Ascorbic Acid (VITAMIN C PO) Take 1,000 mg by mouth in the morning and at bedtime.      cholecalciferol (VITAMIN D3) 25 MCG (1000 UNIT) tablet  Take 1,000 Units by mouth daily.     fluorouracil (EFUDEX) 5 % cream SMARTSIG:sparingly Topical Daily     IBUPROFEN PO Take 600 mg by mouth as needed.     No current facility-administered medications for this visit.     ALLERGIES: Sulfonamide derivatives  Family History  Problem Relation Age of Onset   Diverticulitis Mother    Alzheimer's disease Mother    Alzheimer's disease Maternal Aunt    Alzheimer's disease Maternal Aunt    Ulcerative colitis Maternal Aunt    Alzheimer's disease Maternal Grandmother    Ulcerative colitis Cousin    Colon cancer Neg Hx    Esophageal cancer Neg Hx    Rectal cancer Neg Hx    Stomach cancer Neg Hx    Colon polyps Neg Hx     Social History   Socioeconomic History   Marital status: Married    Spouse name: Not on file   Number of children: Not on file   Years of education: Not on file   Highest education level: Not on  file  Occupational History   Occupation: Materials engineer  Tobacco Use   Smoking status: Never   Smokeless tobacco: Never  Vaping Use   Vaping Use: Never used  Substance and Sexual Activity   Alcohol use: No    Alcohol/week: 0.0 standard drinks of alcohol   Drug use: No   Sexual activity: Not Currently    Birth control/protection: Post-menopausal    Comment: 1st intercourse 48 yo-5 partners  Other Topics Concern   Not on file  Social History Narrative   3 adopted children, 2 boys and 1 girl   Social Determinants of Radio broadcast assistant Strain: Not on file  Food Insecurity: Not on file  Transportation Needs: Not on file  Physical Activity: Not on file  Stress: Not on file  Social Connections: Not on file  Intimate Partner Violence: Not on file    Review of Systems  All other systems reviewed and are negative.   PHYSICAL EXAMINATION:    BP 112/72   Ht 5' 7.75" (1.721 m)   Wt 185 lb (83.9 kg)   LMP 11/02/2012   BMI 28.34 kg/m     General appearance: alert, cooperative and appears stated age    Pelvic: External genitalia:  linear thickening of right right perineum.                Urethra:  normal appearing urethra with no masses, tenderness or lesions           Vulvar biopsy  Consent done.  Sterile prep with betadine.  Local 1% lidocaine, lot [id number], exp 07/02/23.  3 mm punch biopsy done.  Tissue to pathology.  Silver nitrate applied.  Minimal EBL.  No complications.    Chaperone was present for exam:  Estill Bamberg, CMA  ASSESSMENT  Vulvar lesion.  Abdominal bloating.   PLAN  FU biopsy result.  Post biopsy instructions and precautions given.  We reviewed signs and symptoms of ovarian cancer.  Return for pelvic ultrasound and recheck appointment.    An After Visit Summary was printed and given to the patient.  10 min  total time was spent for this patient encounter, including preparation, face-to-face counseling with the patient, coordination of care, and documentation of the encounter in addition to doing the vulvar biopsy.

## 2022-03-20 ENCOUNTER — Ambulatory Visit: Payer: 59 | Admitting: Obstetrics and Gynecology

## 2022-03-20 ENCOUNTER — Encounter: Payer: Self-pay | Admitting: Obstetrics and Gynecology

## 2022-03-20 ENCOUNTER — Other Ambulatory Visit (HOSPITAL_COMMUNITY)
Admission: RE | Admit: 2022-03-20 | Discharge: 2022-03-20 | Disposition: A | Discharge status: Home / Self Care | Payer: 59 | Source: Ambulatory Visit | Attending: Obstetrics and Gynecology | Admitting: Obstetrics and Gynecology

## 2022-03-20 VITALS — BP 112/72 | Ht 67.75 in | Wt 185.0 lb

## 2022-03-20 DIAGNOSIS — N9089 Other specified noninflammatory disorders of vulva and perineum: Secondary | ICD-10-CM

## 2022-03-20 DIAGNOSIS — R140 Abdominal distension (gaseous): Secondary | ICD-10-CM | POA: Diagnosis not present

## 2022-03-20 NOTE — Patient Instructions (Signed)
Vulva Biopsy, Care After After a vulva biopsy, it is common to have: Slight bleeding from the biopsy site. Soreness or slight pain at the biopsy site. Follow these instructions at home: Your doctor may give you more instructions. If you have problems, contact your doctor. Biopsy site care  Follow instructions from your doctor about how to take care of your biopsy site. Make sure you: Clean the area using water and mild soap two times a day or as told by your doctor. Gently pat the area dry. You may shower 24 hours after the procedure. If you were prescribed an antibiotic ointment, apply it as told by your doctor. Do not stop using the antibiotic even if your condition gets better. If told by your doctor, take a warm water bath (sitz bath) to help with pain and soreness. A sitz bath is taken while you are sitting down. Do this as often as told by your doctor. The water should only come up to your hips and cover your butt. You may pat the area dry with a soft, clean towel. Leave stitches (sutures) or skin glue in place for at least 2 weeks. Leave tape strips alone unless you are told to take them off. You may trim the edges of the tape strips if they curl up. Check your biopsy area every day for signs of infection. It may be helpful to use a handheld mirror to do this. Check for: Redness, swelling, or more pain. More fluid or blood. Warmth. Pus or a bad smell. Do not rub the biopsy area after peeing (urinating). Gently pat the area dry, or use a bottle filled with warm water (peri bottle) to clean the area. Gently wipe from front to back. Lifestyle Wear loose, cotton underwear. Do not wear tight pants. For at least 1 week or until your doctor says it is okay: Do not use a tampon, douche, or put anything in your vagina. Do not have sex. Until your doctor says it is okay: Do not exercise. Do not swim or use a hot tub. General instructions Take over-the-counter and prescription  medicines only as told by your doctor. Drink enough fluid to keep your pee (urine) pale yellow. Use a sanitary pad until the bleeding stops. If told, put ice on the biopsy site. To do this: Place ice in a plastic bag. Place a towel between your skin and the bag. Leave the ice on for 20 minutes, 2-3 times a day. Take off the ice if your skin turns bright red. This is very important. If you cannot feel pain, heat, or cold, you have a greater risk of damage to the area. Keep all follow-up visits. Contact a doctor if: You have redness, swelling, or more pain around your biopsy site. You have more fluid or blood coming from your biopsy site. Your biopsy site feels warm when you touch it. Medicines or ice packs do not help with your pain. You have a fever or chills. Get help right away if: You have a lot of bleeding from the vulva. You have pus or a bad smell coming from your biopsy site. You have pain in your belly (abdomen). Summary After the procedure, it is common to have slight bleeding and soreness at the biopsy site. Follow all instructions as told by your doctor. Take sitz baths as told by your doctor. Leave any stitches in place. Check your biopsy site for infection. Signs include redness, swelling, more pain, more fluid or blood, or warmth. Get help   right away if you have a lot of bleeding, pus or a bad smell, or pain in your belly. This information is not intended to replace advice given to you by your health care provider. Make sure you discuss any questions you have with your health care provider. Document Revised: 03/06/2021 Document Reviewed: 03/06/2021 Elsevier Patient Education  2023 Elsevier Inc.  

## 2022-03-25 LAB — SURGICAL PATHOLOGY

## 2022-03-28 ENCOUNTER — Telehealth: Payer: Self-pay | Admitting: Obstetrics and Gynecology

## 2022-03-28 ENCOUNTER — Ambulatory Visit: Payer: 59 | Admitting: Obstetrics and Gynecology

## 2022-03-28 VITALS — BP 122/80 | Ht 67.75 in | Wt 188.0 lb

## 2022-03-28 DIAGNOSIS — A630 Anogenital (venereal) warts: Secondary | ICD-10-CM | POA: Diagnosis not present

## 2022-03-28 NOTE — Patient Instructions (Signed)
Colposcopy  Colposcopy is a procedure to examine the lowest part of the uterus (cervix) for abnormalities or signs of disease. This procedure is done using an instrument that makes objects appear larger and provides light (colposcope). During the procedure, your health care provider may remove a tissue sample to look at under a microscope (biopsy). A biopsy may be done if any unusual cells are seen during the colposcopy. You may have a colposcopy if you have: An abnormal Pap smear, also called a Pap test. This screening test is used to check for signs of cancer or infection of the vagina, cervix, and uterus. An HPV (human papillomavirus) test and get a positive result for a type of HPV that puts you at high risk of cancer. Certain conditions or symptoms, such as: A sore, or lesion, on your cervix. Genital warts on your vulva, vagina, or cervix. Pain during sex. Vaginal bleeding, especially after sex. A growth on your cervix (cervical polyp) that needs to be removed. Let your health care provider know about: Any allergies you have, including allergies to medicines, latex, or iodine. All medicines you are taking, including vitamins, herbs, eye drops, creams, and over-the-counter medicines. Any bleeding problems you have. Any surgeries you have had. Any medical conditions you have, such as pelvic inflammatory disease (PID) or an endometrial disorder. The pattern of your menstrual cycles and the form of birth control (contraception) you use, if any. Your medical history, including any cervical treatments and how well you tolerated the procedure (if you have ever fainted). Whether you are pregnant or may be pregnant. What are the risks? Generally, this is a safe procedure. However, problems may occur, including: Infection. Symptoms of infection may include fever, bad-smelling vaginal discharge, or pelvic pain. Vaginal bleeding. Allergic reactions to medicines. Damage to nearby structures or  organs. What happens before the procedure? Medicines Ask your health care provider about: Changing or stopping your regular medicines. This is especially important if you are taking diabetes medicines or blood thinners. Taking medicines such as aspirin and ibuprofen. These medicines can thin your blood. Do not take these medicines unless your health care provider tells you to take them. Your health care provider will likely tell you to avoid taking aspirin, or medicine that contains aspirin, for 7 days before the procedure. Taking over-the-counter medicines, vitamins, herbs, and supplements. General instructions Tell your health care provider if you have your menstrual period now or will have it at the time of your procedure. A colposcopy is not normally done during your menstrual period. If you use contraception, continue to use it before your procedure. For 24 hours before the procedure: Do not use douche products or tampons. Do not use medicines, creams, or suppositories in the vagina. Do not have sex or insert anything into your vagina. Ask your health care provider what steps will be taken to prevent infection. What happens during the procedure? You will lie down on your back, with your feet in foot rests (stirrups). An instrument called a speculum will be inserted into your vagina. This will be used so your health care provider can see your cervix and the inside of your vagina. A cotton swab will be used to place a small amount of a liquid (solution) on the areas to be examined. This solution makes it easier to see abnormal cells. You may feel a slight burning during this part. The colposcope will be used to scan the cervix with a bright white light. The colposcope will be held near  your vulva and will make your vulva, vagina, and cervix look bigger so they can be seen better. If a biopsy is needed: You may be given a medicine to numb the area (local anesthetic). Surgical tools will be  used to remove mucus and cells through your vagina. You may feel mild pain while the tissue sample is removed. Bleeding may occur. A solution may be used to stop the bleeding. The tissue removed will be sent to a lab to be looked at under a microscope. The procedure may vary among health care providers and hospitals. What happens after the procedure? You may have some cramping in your abdomen. This should go away after a few minutes. It is up to you to get the results of your procedure. Ask your health care provider, or the department that is doing the procedure, when your results will be ready. Summary Colposcopy is a procedure to examine the lowest part of the uterus (cervix), for signs of disease. A biopsy may be done as part of the procedure. You may have some cramping in your abdomen. This should go away after a few minutes. It is up to you to get the results of your procedure. Ask your health care provider, or the department that is doing the procedure, when your results will be ready. This information is not intended to replace advice given to you by your health care provider. Make sure you discuss any questions you have with your health care provider. Document Revised: 11/12/2020 Document Reviewed: 11/12/2020 Elsevier Patient Education  [employer].

## 2022-03-28 NOTE — Progress Notes (Signed)
GYNECOLOGY  VISIT   HPI: 56 y.o.   Married  Caucasian  female   G1P0010 with Patient's last menstrual period was 11/02/2012.   here for consult to discuss pathology results from 03/20/22 right perineal biopsy showing condyloma acuminata with LGSIL. P16 staining negative.   No hx genital warts.   Has an upcoming appointment for [place] to check her ovaries due to abdominal bloating.   Is experiencing stress due to her husband's stroke and her mother who is in assisted living.   GYNECOLOGIC HISTORY: Patient's last menstrual period was 11/02/2012. Contraception:  PMP Menopausal hormone therapy:  none Last mammogram:  06-13-21 Neg/BiRads1 Last pap smear:  06-27-21 Neg:Neg HR HPV, 08-11-17 neg, 05-14-16 neg HPV HR neg, 05-02-15 neg        OB History     Gravida  1   Para  0   Term  0   Preterm  0   AB  1   Living  0      SAB      IAB      Ectopic      Multiple      Live Births                 Patient Active Problem List   Diagnosis Date Noted   IBS (irritable bowel syndrome)    CIN III (cervical intraepithelial neoplasia III)    DIVERTICULOSIS OF COLON 06/19/2007   Rectal bleeding 06/19/2007   ENDOMETRIOSIS 06/19/2007    Past Medical History:  Diagnosis Date   CIN III (cervical intraepithelial neoplasia III)    Endometriosis    Fibroid    GERD (gastroesophageal reflux disease)    IBS (irritable bowel syndrome)    Osteoporosis 05/2017   T score -2.0 prior DEXA 2016 T score -2.8 AP spine    Past Surgical History:  Procedure Laterality Date   CERVICAL BIOPSY  W/ LOOP ELECTRODE EXCISION  1999   COLPOSCOPY     EXPLORATORY LAPAROTOMY  2004   myomectomy and excision endometriosis   PELVIC LAPAROSCOPY     x2   SKIN CANCER EXCISION     Squamous cell    Current Outpatient Medications  Medication Sig Dispense Refill   Ascorbic Acid (VITAMIN C PO) Take 1,000 mg by mouth in the morning and at bedtime.      cholecalciferol (VITAMIN D3) 25 MCG (1000 UNIT)  tablet Take 1,000 Units by mouth daily.     fluorouracil (EFUDEX) 5 % cream SMARTSIG:sparingly Topical Daily     IBUPROFEN PO Take 600 mg by mouth as needed.     No current facility-administered medications for this visit.     ALLERGIES: Sulfonamide derivatives  Family History  Problem Relation Age of Onset   Diverticulitis Mother    Alzheimer's disease Mother    Alzheimer's disease Maternal Aunt    Alzheimer's disease Maternal Aunt    Ulcerative colitis Maternal Aunt    Alzheimer's disease Maternal Grandmother    Ulcerative colitis Cousin    Colon cancer Neg Hx    Esophageal cancer Neg Hx    Rectal cancer Neg Hx    Stomach cancer Neg Hx    Colon polyps Neg Hx     Social History   Socioeconomic History   Marital status: Married    Spouse name: Not on file   Number of children: Not on file   Years of education: Not on file   Highest education level: Not on file  Occupational History  Occupation: Materials engineer  Tobacco Use   Smoking status: Never   Smokeless tobacco: Never  Vaping Use   Vaping Use: Never used  Substance and Sexual Activity   Alcohol use: No    Alcohol/week: 0.0 standard drinks of alcohol   Drug use: No   Sexual activity: Not Currently    Birth control/protection: Post-menopausal    Comment: 1st intercourse 45 yo-5 partners  Other Topics Concern   Not on file  Social History Narrative   3 adopted children, 2 boys and 1 girl   Social Determinants of Radio broadcast assistant Strain: Not on file  Food Insecurity: Not on file  Transportation Needs: Not on file  Physical Activity: Not on file  Stress: Not on file  Social Connections: Not on file  Intimate Partner Violence: Not on file    Review of Systems  All other systems reviewed and are negative.   PHYSICAL EXAMINATION:    BP 122/80 (BP Location: Left Arm, Patient Position: Sitting, Cuff Size: Normal)   Ht 5' 7.75" (1.721 m)   Wt 188 lb (85.3 kg)   LMP 11/02/2012   BMI 28.80 kg/m      General appearance: alert, cooperative and appears stated age                ASSESSMENT  Condyloma/LGSIL of perineum.  Hx CIN III.  Pap normal and HR HPV negative on 06/27/21. Abdominal bloating.  PLAN  We discussed HPV and vulvar dysplasia.  Treatment options of [place], cryotherapy and TCA for condyloma reviewed.  She will return for a pap, colposcopy and treatment of vulvar condyloma.  She will have her pelvic ultrasound on a separate day.   An After Visit Summary was printed and given to the patient.  38 min  total time was spent for this patient encounter, including preparation, face-to-face counseling with the patient, coordination of care, and documentation of the encounter.

## 2022-03-28 NOTE — Telephone Encounter (Signed)
Please call patient to schedule pap, colposcopy with me and treatment of condyloma.

## 2022-03-29 ENCOUNTER — Other Ambulatory Visit: Payer: Self-pay | Admitting: *Deleted

## 2022-03-29 ENCOUNTER — Encounter: Payer: Self-pay | Admitting: Obstetrics and Gynecology

## 2022-03-29 DIAGNOSIS — N903 Dysplasia of vulva, unspecified: Secondary | ICD-10-CM

## 2022-04-02 NOTE — Progress Notes (Unsigned)
  Subjective:     Patient ID: Jennifer Velez, female   DOB: Jan 21, 1966, 56 y.o.   MRN: [id number]   Contraception:pmp PAP: 06-27-21 neg HPV HR neg 03-20-22 rt perineal biopsy showing condyloma acuminata with LGSIL. Pt here for pap, vulvar colposcopy & treatment of vulvar condyloma.  Patient worried about her mother's health and decreased ability to care for herself.   HPI   Review of Systems  Constitutional: Negative.   HENT: Negative.    Eyes: Negative.   Respiratory: Negative.    Cardiovascular: Negative.   Gastrointestinal: Negative.   Endocrine: Negative.   Genitourinary: Negative.   Musculoskeletal: Negative.   Skin: Negative.   Allergic/Immunologic: Negative.   Neurological: Negative.   Hematological: Negative.   Psychiatric/Behavioral: Negative.         Objective:   Physical Exam Colposcopy of cervix, vulva, and vagina.  Consent done.  Pap and HR HPV collected.  3% acetic acid used.   Atrophy of vagina noted.  Subtle acetowhite area of the cervix at 6:00.  No acetowhite lesions of the vagina.  ECC and biopsy at 6:00 collected and sent to pathology separately.  Colpo of the vulva showed raised area of right perineum at prior biopsy site.  Matching change noted of the left perineum.  Below and the left of this was a raised verruous  area 7 mm.  Biopsy of this site taken with a 3 mm punch biopsy after Hibiclens prep and local 1% lidocaine, 1% [id number], exp 07/02/23.  Silver nitrate placed.   Minimal EBL.  No complications.     Assessment:     VIN I.  Caregiver stress.     Plan:     Pap and HR HPV. Follow up ECC, biopsy of cervix and perineal biopsy.   I anticipate future treatment of the perineum, which will try to do after her pelvic US is completed.   I encouraged patient to increase her circle of support for the increased needs of her mother.

## 2022-04-04 ENCOUNTER — Other Ambulatory Visit (HOSPITAL_COMMUNITY)
Admission: RE | Admit: 2022-04-04 | Discharge: 2022-04-04 | Disposition: A | Discharge status: Home / Self Care | Payer: 59 | Source: Ambulatory Visit | Attending: Obstetrics and Gynecology | Admitting: Obstetrics and Gynecology

## 2022-04-04 ENCOUNTER — Encounter: Payer: Self-pay | Admitting: Obstetrics and Gynecology

## 2022-04-04 ENCOUNTER — Ambulatory Visit: Payer: 59 | Admitting: Obstetrics and Gynecology

## 2022-04-04 VITALS — BP 110/72 | HR 74 | Wt 185.0 lb

## 2022-04-04 DIAGNOSIS — N903 Dysplasia of vulva, unspecified: Secondary | ICD-10-CM

## 2022-04-04 DIAGNOSIS — Z124 Encounter for screening for malignant neoplasm of cervix: Secondary | ICD-10-CM | POA: Diagnosis not present

## 2022-04-04 DIAGNOSIS — N9089 Other specified noninflammatory disorders of vulva and perineum: Secondary | ICD-10-CM | POA: Insufficient documentation

## 2022-04-04 DIAGNOSIS — N871 Moderate cervical dysplasia: Secondary | ICD-10-CM | POA: Diagnosis not present

## 2022-04-04 NOTE — Telephone Encounter (Signed)
Encounter closed

## 2022-04-04 NOTE — Patient Instructions (Signed)
Colposcopy, Care After  The following information offers guidance on how to care for yourself after your procedure. Your health care provider may also give you more specific instructions. If you have problems or questions, contact your health care provider. What can I expect after the procedure? If you had a colposcopy without a biopsy, you can expect to feel fine right away after your procedure. However, you may have some spotting of blood for a few days. You can return to your normal activities. If you had a colposcopy with a biopsy, it is common after the procedure to have: Soreness and mild pain. These may last for a few days. Mild vaginal bleeding or discharge that is dark-colored and grainy. This may last for a few days. The discharge may be caused by a liquid (solution) that was used during the procedure. You may need to wear a sanitary pad during this time. Spotting of blood for at least 48 hours after the procedure. Follow these instructions at home: Medicines Take over-the-counter and prescription medicines only as told by your health care provider. Talk with your health care provider about what type of over-the-counter pain medicines and prescription medicines you can start to take again. It is especially important to talk with your health care provider if you take blood thinners. Activity Avoid using douche products, using tampons, and having sex for at least 3 days after the procedure or for as long as told by your health care provider. Return to your normal activities as told by your health care provider. Ask your health care provider what activities are safe for you. General instructions Ask your health care provider if you may take baths, swim, or use a hot tub. You may take showers. If you use birth control (contraception), continue to use it. Keep all follow-up visits. This is important. Contact a health care provider if: You have a fever or chills. You faint or feel  light-headed. Get help right away if: You have heavy bleeding from your vagina or pass blood clots. Heavy bleeding is bleeding that soaks through a sanitary pad in less than 1 hour. You have vaginal discharge that is abnormal, is yellow in color, or smells bad. This could be a sign of infection. You have severe pain or cramps in your lower abdomen that do not go away with medicine. Summary If you had a colposcopy without a biopsy, you can expect to feel fine right away, but you may have some spotting of blood for a few days. You can return to your normal activities. If you had a colposcopy with a biopsy, it is common to have mild pain for a few days and spotting for 48 hours after the procedure. Avoid using douche products, using tampons, and having sex for at least 3 days after the procedure or for as long as told by your health care provider. Get help right away if you have heavy bleeding, severe pain, or signs of infection. This information is not intended to replace advice given to you by your health care provider. Make sure you discuss any questions you have with your health care provider. Document Revised: 11/12/2020 Document Reviewed: 11/12/2020 Elsevier Patient Education  2023 Elsevier Inc.  

## 2022-04-09 NOTE — Progress Notes (Unsigned)
GYNECOLOGY  VISIT   HPI: 56 y.o.   Married  Caucasian  female   [id number] with Patient's last menstrual period was 11/02/2012.   here for ultrasound consult for bloating.  No vaginal bleeding.   She had a CT of the abdomen showing a fibroid of uterus on 03/01/21. Had prior US showing fibroids.   Hx CIN III. Status post LEEP in 1999. She had a biopsy of right perineum showing VIN I and biopsy of the left perineum showing VIN III.  A cervical biopsy showed CIN I.   GYNECOLOGIC HISTORY: Patient's last menstrual period was 11/02/2012. Contraception:  pmp  Menopausal hormone therapy:  none  Last mammogram: 06-13-21 Neg/BiRads1 Last pap smear:  06-27-21 Neg:Neg HR HPV, 08-11-17 neg, 05-14-16 neg HPV HR neg, 05-02-15 neg OB History     Gravida  1   Para  0   Term  0   Preterm  0   AB  1   Living  0      SAB      IAB      Ectopic      Multiple      Live Births                 Patient Active Problem List   Diagnosis Date Noted   IBS (irritable bowel syndrome)    CIN III (cervical intraepithelial neoplasia III)    DIVERTICULOSIS OF COLON 06/19/2007   Rectal bleeding 06/19/2007   ENDOMETRIOSIS 06/19/2007    Past Medical History:  Diagnosis Date   CIN III (cervical intraepithelial neoplasia III)    Endometriosis    Fibroid    GERD (gastroesophageal reflux disease)    IBS (irritable bowel syndrome)    Osteoporosis 05/2017   T score -2.0 prior DEXA 2016 T score -2.8 AP spine    Past Surgical History:  Procedure Laterality Date   CERVICAL BIOPSY  W/ LOOP ELECTRODE EXCISION  1999   COLPOSCOPY     EXPLORATORY LAPAROTOMY  2004   myomectomy and excision endometriosis   PELVIC LAPAROSCOPY     x2   SKIN CANCER EXCISION     Squamous cell    Current Outpatient Medications  Medication Sig Dispense Refill   Ascorbic Acid (VITAMIN C PO) Take by mouth daily.     cholecalciferol (VITAMIN D3) 25 MCG (1000 UNIT) tablet Take 1,000 Units by mouth daily.      fluorouracil (EFUDEX) 5 % cream SMARTSIG:sparingly Topical Daily     IBUPROFEN PO Take 600 mg by mouth as needed.     No current facility-administered medications for this visit.     ALLERGIES: Sulfonamide derivatives  Family History  Problem Relation Age of Onset   Diverticulitis Mother    Alzheimer's disease Mother    Alzheimer's disease Maternal Aunt    Alzheimer's disease Maternal Aunt    Ulcerative colitis Maternal Aunt    Alzheimer's disease Maternal Grandmother    Ulcerative colitis Cousin    Colon cancer Neg Hx    Esophageal cancer Neg Hx    Rectal cancer Neg Hx    Stomach cancer Neg Hx    Colon polyps Neg Hx     Social History   Socioeconomic History   Marital status: Married    Spouse name: Not on file   Number of children: Not on file   Years of education: Not on file   Highest education level: Not on file  Occupational History   Occupation: Home maker  Tobacco  Use   Smoking status: Never   Smokeless tobacco: Never  Vaping Use   Vaping Use: Never used  Substance and Sexual Activity   Alcohol use: No    Alcohol/week: 0.0 standard drinks of alcohol   Drug use: No   Sexual activity: Not Currently    Birth control/protection: Post-menopausal    Comment: 1st intercourse 44 yo-5 partners  Other Topics Concern   Not on file  Social History Narrative   3 adopted children, 2 boys and 1 girl   Social Determinants of Radio broadcast assistant Strain: Not on file  Food Insecurity: Not on file  Transportation Needs: Not on file  Physical Activity: Not on file  Stress: Not on file  Social Connections: Not on file  Intimate Partner Violence: Not on file    Review of Systems  All other systems reviewed and are negative.   PHYSICAL EXAMINATION:    BP 118/72   Pulse 77   Wt 187 lb (84.8 kg)   LMP 11/02/2012   SpO2 99%   BMI 28.64 kg/m     General appearance: alert, cooperative and appears stated age   Pelvic: External genitalia: thickened  epithelium of the right perineum surrounded by subtle white epithelium.   The left perineum has the biopsy location and an arched shaped area of erythema to the tissue.                Urethra:  normal appearing urethra with no masses, tenderness or lesions         Pelvic US  Uterus 7.55 x 5.98 x 5.09 cm.  Fibroid 4.3 x 3.7 x 4.3 cm. Anterior fundal, partially submucosal versus intramural fibroid.  EMS 6.68 mm.  Left ovary 2.32 x 1.55 x 1.63 cm.  Right ovary 2.50 x 1.98 x 1.26 cm.  Right ovarian follicle 9.1 mm.  No adnexal masses.  No free fluid.            Chaperone was present for exam:  Santiago Glad, CMA  ASSESSMENT  VIN III.  I suspect the high grade dysplasia extends along the left lateral perineum.  VIN I on the right perineum. Uterine fibroids.   PLAN  We reviewed her ultrasound findings and images.  Will refer to GYN ONC. Fu in 3 months for next [place].    An After Visit Summary was printed and given to the patient.

## 2022-04-10 LAB — CYTOLOGY - PAP
Comment: NEGATIVE
Diagnosis: NEGATIVE
Diagnosis: REACTIVE
High risk HPV: NEGATIVE

## 2022-04-15 ENCOUNTER — Telehealth: Payer: Self-pay | Admitting: *Deleted

## 2022-04-15 NOTE — Telephone Encounter (Signed)
Patient informed. Patient is scheduled for pelvic ultrasound tomorrow at 4pm. She reports last time you seen her you told her that you would treat after ultrasound visit ( patient said meaning same day) Please advise

## 2022-04-15 NOTE — Telephone Encounter (Signed)
-----   Message from Nunzio Cobbs, MD sent at 04/12/2022 10:27 AM EDT ----- Please contact patient with results.   Her pap was normal and her high risk HPV negative.   Her cervical biopsy showed mild dysplasia. Her ECC was benign.  Her vulvar biopsy showed severe dysplasia, which will need treatment with removal.  No cancer was identified.  Please have her see me in the office for a pelvic exam recheck and planning for treatment. This will not be treated the day of the visit.

## 2022-04-16 ENCOUNTER — Ambulatory Visit (INDEPENDENT_AMBULATORY_CARE_PROVIDER_SITE_OTHER): Payer: 59

## 2022-04-16 ENCOUNTER — Ambulatory Visit (INDEPENDENT_AMBULATORY_CARE_PROVIDER_SITE_OTHER): Payer: 59 | Admitting: Obstetrics and Gynecology

## 2022-04-16 VITALS — BP 118/72 | HR 77 | Wt 187.0 lb

## 2022-04-16 DIAGNOSIS — D250 Submucous leiomyoma of uterus: Secondary | ICD-10-CM | POA: Diagnosis not present

## 2022-04-16 DIAGNOSIS — R140 Abdominal distension (gaseous): Secondary | ICD-10-CM

## 2022-04-16 DIAGNOSIS — N870 Mild cervical dysplasia: Secondary | ICD-10-CM

## 2022-04-16 DIAGNOSIS — D071 Carcinoma in situ of vulva: Secondary | ICD-10-CM | POA: Diagnosis not present

## 2022-04-16 DIAGNOSIS — D251 Intramural leiomyoma of uterus: Secondary | ICD-10-CM | POA: Diagnosis not present

## 2022-04-16 NOTE — Telephone Encounter (Signed)
Spoke with patient and informed her that Dr. 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 She is coming in later and said she "will find out what's going on".

## 2022-04-16 NOTE — Telephone Encounter (Signed)
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

## 2022-04-16 NOTE — Telephone Encounter (Signed)
Left message for patient to call.

## 2022-04-17 ENCOUNTER — Encounter: Payer: Self-pay | Admitting: Obstetrics and Gynecology

## 2022-04-17 ENCOUNTER — Telehealth: Payer: Self-pay | Admitting: Obstetrics and Gynecology

## 2022-04-17 DIAGNOSIS — D071 Carcinoma in situ of vulva: Secondary | ICD-10-CM

## 2022-04-17 NOTE — Telephone Encounter (Signed)
Patient seen in office yesterday.  Results of colposcopy biopsies explained and new plan created.   Encounter reviewed and closed.

## 2022-04-17 NOTE — Telephone Encounter (Signed)
Please make a referral to [place] at the [facility] for my patient who has VIN III.   Please also place the patient in pap recall for this office for 12 months due to dx of CIN I.

## 2022-04-18 NOTE — Telephone Encounter (Signed)
Referral placed at Cancer center they will call to schedule.   Recall placed for 1 year.

## 2022-04-22 ENCOUNTER — Telehealth: Payer: Self-pay

## 2022-04-22 NOTE — Telephone Encounter (Signed)
Spoke with the patient regarding the referral to GYN oncology. Patient scheduled as new patient with Dr Ernestina Patches on 05/06/2022. Patient given an arrival time of 8:30am.  Explained to the patient the the doctor will perform a pelvic exam at this visit. Patient given the policy that no visitors under the 16 yrs are allowed in the [facility]. Patient given the address/phone number for the clinic and that the center offers free valet service.

## 2022-04-24 NOTE — Telephone Encounter (Signed)
Appt scheduled 05/06/22 at 9am with [name], MD.

## 2022-05-05 NOTE — Progress Notes (Unsigned)
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  We reviewed the nature of vulvar dysplasia. Surgical excision is the mainstay of treatment, but ablative therapy or pharmacologic treatment is an option for some patients in certain clinical scenarios. The goals of treatment of vulvar dysplasia are to prevent development of vulvar squamous carcinoma and relieve any associated symptoms, such as pain or itching. The goal is also preserve vulvar anatomy as best as possible.  Excision provides both treatment and a diagnostic specimen for those women with high-grade dysplasia. Invasive squamous cell carcinoma is present at the time of excision in 10-22% of women with VIN on initial biopsy.    She has elected to proceed with surgical excision. We discussed additionally using CO2 ablation for some of the multifocal disease on the bilateral labia minora.  Patient was consented for: simple partial vulvectomy, CO2 laser ablation on 05/28/22. Pt requests a first start case as one of her adopted children has Down syndrome, and she feels it would be better to have an early procedure to potentially be available later in the day to help with care.  Patient aware that she stay to could be at the hospital recovering from anesthesia.  The risks of surgery were discussed in detail and she understands these to including but not limited to bleeding requiring a blood transfusion, infection, injury to adjacent organs (including but not limited to the rectum, urethra, nearby vessels and nerves), wound separation, unforseen complication, and possible need for re-exploration.  If the patient experiences any of these events, she understands that her hospitalization or recovery may be prolonged and that she may need to take additional medications for a prolonged period. The patient  will receive DVT and antibiotic prophylaxis as indicated. She voiced a clear understanding. She had the opportunity to ask questions and written informed consent was obtained today. She wishes to proceed.  She does not require preoperative clearance. Her METs are >4.  All preoperative instructions were reviewed. Postoperative expectations were also reviewed.   A copy of this note was sent to the patient's referring provider.  [name], MD Gynecologic Oncology   Medical Decision Making I personally spent  TOTAL 48 minutes face-to-face and non-face-to-face in the care of this patient, which includes all pre, intra, and post visit time on the date of service.  5 minutes spent reviewing records prior to the visit 30 Minutes in patient contact      5 minutes in other billable services 8 minutes charting , conferring with consultants etc.   ------------  CC: Vulvar dysplasia  HISTORY OF PRESENT ILLNESS:  Jennifer Velez is a 56 y.o. woman who is seen in consultation at the request of Nunzio Cobbs, MD for evaluation of vulvar dysplasia.  Patient presented to her OB/GYN on 03/13/2022 for an annual exam.  At that time a small 4 x 2 mm raised area on the right side of the perineum was noted.  She was recommended to return for vulvar biopsy.A 3 mm punch biopsy was obtained on 03/20/2022.  At that time she also noted bloating and constipation so a pelvic ultrasound was ordered.  Her biopsy returned with condyloma.  She was recommended to return for a Pap smear and vulvar colposcopy occurred on 04/04/2022.  Her colposcopy noted subtle acetowhite changes of the cervix at 6:00 and a  raised area of the right perineum at prior biopsy site as well as matching changes to the left perineum with a raised 7 mm lesion.  A cervical biopsy was obtained as well as a biopsy of the left perineum.  Her Pap returned NILM, HPV negative.  Her cervical biopsy showed CIN-1 and ECC was negative.  The  left perineal biopsy returned with "severe squamous dysplasia with condylomatous features."  Today patient reports that she has been undergoing work-up for right upper quadrant pain including with an endoscopy.  She also reports hemorrhoids.  Otherwise she denies vaginal bleeding or itching but is not sure if some of her irritation near her rectum is instead this lesion.   PAST MEDICAL HISTORY: Past Medical History:  Diagnosis Date   CIN III (cervical intraepithelial neoplasia III)    Endometriosis    Fibroid    GERD (gastroesophageal reflux disease)    IBS (irritable bowel syndrome)    Osteoporosis 05/2017   T score -2.0 prior DEXA 2016 T score -2.8 AP spine    PAST SURGICAL HISTORY: Past Surgical History:  Procedure Laterality Date   CERVICAL BIOPSY  W/ LOOP ELECTRODE EXCISION  1999   COLPOSCOPY     EXPLORATORY LAPAROTOMY  2004   myomectomy and excision endometriosis   PELVIC LAPAROSCOPY  1992   x2, for endometriosis   SKIN CANCER EXCISION     Squamous cell    OB/GYN HISTORY: OB History  Gravida Para Term Preterm AB Living  1 0 0 0 1 0  SAB IAB Ectopic Multiple Live Births  1            # Outcome Date GA Lbr Len/2nd Weight Sex Delivery Anes PTL Lv  1 SAB               Age at menarche: [age] Age at menopause: ~6 Hx of HRT: No Hx of STI: No Last pap: 04/04/22 NILM HRHPV neg (prior: 06/27/21 NILM HRHPV neg; 08/11/17 NILM; 05/14/16 NILM HRHPV neg; 05/02/15 NILM) History of abnormal pap smears: Yes, LEEP in [place]:  Last mammogram: 06/13/21 BIRADS 1 Last colonoscopy: 2022  MEDICATIONS:  Current Outpatient Medications:    Ascorbic Acid (VITAMIN C PO), Take by mouth daily., Disp: , Rfl:    cholecalciferol (VITAMIN D3) 25 MCG (1000 UNIT) tablet, Take 1,000 Units by mouth daily., Disp: , Rfl:    fluorouracil (EFUDEX) 5 % cream, SMARTSIG:sparingly Topical Daily, Disp: , Rfl:    ibuprofen (ADVIL) 800 MG tablet, Take 1 tablet (800 mg total) by mouth  every 8 (eight) hours as needed for mild pain. For AFTER surgery only, Disp: 30 tablet, Rfl: 0   senna-docusate (SENOKOT-S) 8.6-50 MG tablet, Take 2 tablets by mouth at bedtime. For AFTER surgery, do not take if having diarrhea, Disp: 30 tablet, Rfl: 0   traMADol (ULTRAM) 50 MG tablet, Take 1 tablet (50 mg total) by mouth every 6 (six) hours as needed. For AFTER surgery only, do not take and drive, Disp: 10 tablet, Rfl: 0  ALLERGIES: Allergies  Allergen Reactions   Sulfonamide Derivatives     hives    FAMILY HISTORY: Family History  Problem Relation Age of Onset   Diverticulitis Mother    Alzheimer's disease Mother    Alzheimer's disease Maternal Grandmother    Alzheimer's disease Maternal Aunt    Alzheimer's disease Maternal Aunt    Ulcerative colitis Maternal Aunt    Ulcerative colitis Cousin    Colon cancer Neg Hx  Esophageal cancer Neg Hx    Rectal cancer Neg Hx    Stomach cancer Neg Hx    Colon polyps Neg Hx    Breast cancer Neg Hx    Ovarian cancer Neg Hx    Endometrial cancer Neg Hx     SOCIAL HISTORY: Social History   Socioeconomic History   Marital status: Married    Spouse name: Not on file   Number of children: Not on file   Years of education: Not on file   Highest education level: Not on file  Occupational History   Occupation: Home maker  Tobacco Use   Smoking status: Never   Smokeless tobacco: Never  Vaping Use   Vaping Use: Never used  Substance and Sexual Activity   Alcohol use: No    Alcohol/week: 0.0 standard drinks of alcohol   Drug use: No   Sexual activity: Not Currently    Birth control/protection: Post-menopausal    Comment: 1st intercourse 61 yo-5 partners  Other Topics Concern   Not on file  Social History Narrative   3 adopted children, 2 boys and 1 girl   Social Determinants of Radio broadcast assistant Strain: Not on file  Food Insecurity: Not on file  Transportation Needs: Not on file  Physical Activity: Not on file   Stress: Not on file  Social Connections: Not on file  Intimate Partner Violence: Not on file    REVIEW OF SYSTEMS: New patient intake form was reviewed.  Complete 10-system review is negative except for the following: Constipation, urinary frequency, bloating, abdominal pain, depression, ringing in ears  PHYSICAL EXAM: BP (!) 116/56 (BP Location: Left Arm, Patient Position: Sitting)   Pulse 74   Temp 98.6 F (37 C) (Oral)   Resp 16   Ht '5\' 7"'$  (1.702 m)   Wt 189 lb (85.7 kg)   LMP 11/02/2012   SpO2 97%   BMI 29.60 kg/m  Constitutional: No acute distress. Neuro/Psych: Alert, oriented.  Head and Neck: Normocephalic, atraumatic. Neck symmetric without masses. Sclera anicteric.  Respiratory: Normal work of breathing. Clear to auscultation bilaterally. Cardiovascular: Regular rate and rhythm, no murmurs, rubs, or gallops. Abdomen:  Normoactive bowel sounds. Soft, non-distended, non-tender to palpation.  No evidence of hernia. No palpable fluid wave.  Well-healed Pfannenstiel incision. Extremities: Grossly normal range of motion. Warm, well perfused. No edema bilaterally. Skin: No rashes or lesions. Lymphatic: No cervical, supraclavicular, or inguinal adenopathy. Genitourinary: External genitalia raised lesion at the posterior fourchette that extends to the left perineal body. Urethral meatus without lesions or prolapse. On speculum exam, vagina and cervix without lesions. Bimanual exam reveals mildly enlarged but mobile uterus; no adnexal masses or tenderness. Exam chaperoned by Joylene John, NP  Physical Exam Genitourinary:     COLPOSCOPY PROCEDURE NOTE  Procedure Details: After appropriate verbal informed consent was obtained, a timeout was performed.  Acetic acid was applied to the vulva with the findings as noted below. The vulva was then rinsed with water to remove the acetic acid. The patient tolerated the procedure well.   Adequate Exam: yes  Biopsy Specimen:  no  Condition: Stable. Patient tolerated procedure well.  Complications: None  Findings: Raised lesion of the posterior fourchette that extends posteriorly on the left perineal body with acetowhite changes. Additional aceowhite changes without overt lesion noted on the bilateral medial labia minor more anteriorly   Colposcopic Impression: VIN3  LABORATORY AND RADIOLOGIC DATA: Outside medical records were reviewed to synthesize the above history, along  with the history and physical obtained during the visit.  Outside laboratory, pathology, and imaging reports were reviewed, with pertinent results below.  I personally reviewed the outside images.  WBC  Date Value Ref Range Status  12/21/2020 5.4 4.0 - 10.5 K/uL Final   Hemoglobin  Date Value Ref Range Status  12/21/2020 14.9 12.0 - 15.0 g/dL Final   HCT  Date Value Ref Range Status  12/21/2020 41.6 36.0 - 46.0 % Final   Platelets  Date Value Ref Range Status  12/21/2020 261.0 150.0 - 400.0 K/uL Final   Creat  Date Value Ref Range Status  05/15/2020 0.71 0.50 - 1.05 mg/dL Final    Comment:    For patients >70 years of age, the reference limit for Creatinine is approximately 13% higher for people identified as African-American. .    Creatinine, Ser  Date Value Ref Range Status  05/02/2021 0.74 0.40 - 1.20 mg/dL Final   AST  Date Value Ref Range Status  05/02/2021 17 0 - 37 U/L Final   ALT  Date Value Ref Range Status  05/02/2021 16 0 - 35 U/L Final    Surgical pathology (04/04/22): FINAL MICROSCOPIC DIAGNOSIS:   A. ENDOCERVIX, CURETTAGE:  - Scant, benign endocervical mucosa.  - Negative for dysplasia or malignancy.   B. CERVIX, 12 O'CLOCK, BIOPSY:  - Scant, fragments of squamous mucosa with koilocytic effect (CIN-1).  - Scant fragments of endocervical cells with reactive changes.  - Negative for malignancy.  - See comment.   C. PERINEUM, LEFT, BIOPSY:  - Severe squamous dysplasia with condylomatous  features.  - Lateral margin involved.  - Negative for malignancy.

## 2022-05-05 NOTE — H&P (View-Only) (Signed)
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  We reviewed the nature of vulvar dysplasia. Surgical excision is the mainstay of treatment, but ablative therapy or pharmacologic treatment is an option for some patients in certain clinical scenarios. The goals of treatment of vulvar dysplasia are to prevent development of vulvar squamous carcinoma and relieve any associated symptoms, such as pain or itching. The goal is also preserve vulvar anatomy as best as possible.  Excision provides both treatment and a diagnostic specimen for those women with high-grade dysplasia. Invasive squamous cell carcinoma is present at the time of excision in 10-22% of women with VIN on initial biopsy.    She has elected to proceed with surgical excision. We discussed additionally using CO2 ablation for some of the multifocal disease on the bilateral labia minora.  Patient was consented for: simple partial vulvectomy, CO2 laser ablation on 05/28/22. Pt requests a first start case as one of her adopted children has Down syndrome, and she feels it would be better to have an early procedure to potentially be available later in the day to help with care.  Patient aware that she stay to could be at the hospital recovering from anesthesia.  The risks of surgery were discussed in detail and she understands these to including but not limited to bleeding requiring a blood transfusion, infection, injury to adjacent organs (including but not limited to the rectum, urethra, nearby vessels and nerves), wound separation, unforseen complication, and possible need for re-exploration.  If the patient experiences any of these events, she understands that her hospitalization or recovery may be prolonged and that she may need to take additional medications for a prolonged period. The patient  will receive DVT and antibiotic prophylaxis as indicated. She voiced a clear understanding. She had the opportunity to ask questions and written informed consent was obtained today. She wishes to proceed.  She does not require preoperative clearance. Her METs are >4.  All preoperative instructions were reviewed. Postoperative expectations were also reviewed.   A copy of this note was sent to the patient's referring provider.  Jennifer Bell, MD Gynecologic Oncology   Medical Decision Making I personally spent  TOTAL 48 minutes face-to-face and non-face-to-face in the care of this patient, which includes all pre, intra, and post visit time on the date of service.  5 minutes spent reviewing records prior to the visit 30 Minutes in patient contact      5 minutes in other billable services 8 minutes charting , conferring with consultants etc.   ------------  CC: Vulvar dysplasia  HISTORY OF PRESENT ILLNESS:  Jennifer Velez is a 56 y.o. woman who is seen in consultation at the request of Jennifer Cobbs, MD for evaluation of vulvar dysplasia.  Patient presented to her OB/GYN on 03/13/2022 for an annual exam.  At that time a small 4 x 2 mm raised area on the right side of the perineum was noted.  She was recommended to return for vulvar biopsy.A 3 mm punch biopsy was obtained on 03/20/2022.  At that time she also noted bloating and constipation so a pelvic ultrasound was ordered.  Her biopsy returned with condyloma.  She was recommended to return for a Pap smear and vulvar colposcopy occurred on 04/04/2022.  Her colposcopy noted subtle acetowhite changes of the cervix at 6:00 and a  raised area of the right perineum at prior biopsy site as well as matching changes to the left perineum with a raised 7 mm lesion.  A cervical biopsy was obtained as well as a biopsy of the left perineum.  Her Pap returned NILM, HPV negative.  Her cervical biopsy showed CIN-1 and ECC was negative.  The  left perineal biopsy returned with "severe squamous dysplasia with condylomatous features."  Today patient reports that she has been undergoing work-up for right upper quadrant pain including with an endoscopy.  She also reports hemorrhoids.  Otherwise she denies vaginal bleeding or itching but is not sure if some of her irritation near her rectum is instead this lesion.   PAST MEDICAL HISTORY: Past Medical History:  Diagnosis Date   CIN III (cervical intraepithelial neoplasia III)    Endometriosis    Fibroid    GERD (gastroesophageal reflux disease)    IBS (irritable bowel syndrome)    Osteoporosis 05/2017   T score -2.0 prior DEXA 2016 T score -2.8 AP spine    PAST SURGICAL HISTORY: Past Surgical History:  Procedure Laterality Date   CERVICAL BIOPSY  W/ LOOP ELECTRODE EXCISION  1999   COLPOSCOPY     EXPLORATORY LAPAROTOMY  2004   myomectomy and excision endometriosis   PELVIC LAPAROSCOPY  1992   x2, for endometriosis   SKIN CANCER EXCISION     Squamous cell    OB/GYN HISTORY: OB History  Gravida Para Term Preterm AB Living  1 0 0 0 1 0  SAB IAB Ectopic Multiple Live Births  1            # Outcome Date GA Lbr Len/2nd Weight Sex Delivery Anes PTL Lv  1 SAB               Age at menarche: [age] Age at menopause: ~28 Hx of HRT: No Hx of STI: No Last pap: 04/04/22 NILM HRHPV neg (prior: 06/27/21 NILM HRHPV neg; 08/11/17 NILM; 05/14/16 NILM HRHPV neg; 05/02/15 NILM) History of abnormal pap smears: Yes, LEEP in [place]:  Last mammogram: 06/13/21 BIRADS 1 Last colonoscopy: 2022  MEDICATIONS:  Current Outpatient Medications:    Ascorbic Acid (VITAMIN C PO), Take by mouth daily., Disp: , Rfl:    cholecalciferol (VITAMIN D3) 25 MCG (1000 UNIT) tablet, Take 1,000 Units by mouth daily., Disp: , Rfl:    fluorouracil (EFUDEX) 5 % cream, SMARTSIG:sparingly Topical Daily, Disp: , Rfl:    ibuprofen (ADVIL) 800 MG tablet, Take 1 tablet (800 mg total) by mouth  every 8 (eight) hours as needed for mild pain. For AFTER surgery only, Disp: 30 tablet, Rfl: 0   senna-docusate (SENOKOT-S) 8.6-50 MG tablet, Take 2 tablets by mouth at bedtime. For AFTER surgery, do not take if having diarrhea, Disp: 30 tablet, Rfl: 0   traMADol (ULTRAM) 50 MG tablet, Take 1 tablet (50 mg total) by mouth every 6 (six) hours as needed. For AFTER surgery only, do not take and drive, Disp: 10 tablet, Rfl: 0  ALLERGIES: Allergies  Allergen Reactions   Sulfonamide Derivatives     hives    FAMILY HISTORY: Family History  Problem Relation Age of Onset   Diverticulitis Mother    Alzheimer's disease Mother    Alzheimer's disease Maternal Grandmother    Alzheimer's disease Maternal Aunt    Alzheimer's disease Maternal Aunt    Ulcerative colitis Maternal Aunt    Ulcerative colitis Cousin    Colon cancer Neg Hx  Esophageal cancer Neg Hx    Rectal cancer Neg Hx    Stomach cancer Neg Hx    Colon polyps Neg Hx    Breast cancer Neg Hx    Ovarian cancer Neg Hx    Endometrial cancer Neg Hx     SOCIAL HISTORY: Social History   Socioeconomic History   Marital status: Married    Spouse name: Not on file   Number of children: Not on file   Years of education: Not on file   Highest education level: Not on file  Occupational History   Occupation: Home maker  Tobacco Use   Smoking status: Never   Smokeless tobacco: Never  Vaping Use   Vaping Use: Never used  Substance and Sexual Activity   Alcohol use: No    Alcohol/week: 0.0 standard drinks of alcohol   Drug use: No   Sexual activity: Not Currently    Birth control/protection: Post-menopausal    Comment: 1st intercourse 76 yo-5 partners  Other Topics Concern   Not on file  Social History Narrative   3 adopted children, 2 boys and 1 girl   Social Determinants of Radio broadcast assistant Strain: Not on file  Food Insecurity: Not on file  Transportation Needs: Not on file  Physical Activity: Not on file   Stress: Not on file  Social Connections: Not on file  Intimate Partner Violence: Not on file    REVIEW OF SYSTEMS: New patient intake form was reviewed.  Complete 10-system review is negative except for the following: Constipation, urinary frequency, bloating, abdominal pain, depression, ringing in ears  PHYSICAL EXAM: BP (!) 116/56 (BP Location: Left Arm, Patient Position: Sitting)   Pulse 74   Temp 98.6 F (37 C) (Oral)   Resp 16   Ht '5\' 7"'$  (1.702 m)   Wt 189 lb (85.7 kg)   LMP 11/02/2012   SpO2 97%   BMI 29.60 kg/m  Constitutional: No acute distress. Neuro/Psych: Alert, oriented.  Head and Neck: Normocephalic, atraumatic. Neck symmetric without masses. Sclera anicteric.  Respiratory: Normal work of breathing. Clear to auscultation bilaterally. Cardiovascular: Regular rate and rhythm, no murmurs, rubs, or gallops. Abdomen:  Normoactive bowel sounds. Soft, non-distended, non-tender to palpation.  No evidence of hernia. No palpable fluid wave.  Well-healed Pfannenstiel incision. Extremities: Grossly normal range of motion. Warm, well perfused. No edema bilaterally. Skin: No rashes or lesions. Lymphatic: No cervical, supraclavicular, or inguinal adenopathy. Genitourinary: External genitalia raised lesion at the posterior fourchette that extends to the left perineal body. Urethral meatus without lesions or prolapse. On speculum exam, vagina and cervix without lesions. Bimanual exam reveals mildly enlarged but mobile uterus; no adnexal masses or tenderness. Exam chaperoned by Jennifer John, NP  Physical Exam Genitourinary:     COLPOSCOPY PROCEDURE NOTE  Procedure Details: After appropriate verbal informed consent was obtained, a timeout was performed.  Acetic acid was applied to the vulva with the findings as noted below. The vulva was then rinsed with water to remove the acetic acid. The patient tolerated the procedure well.   Adequate Exam: yes  Biopsy Specimen:  no  Condition: Stable. Patient tolerated procedure well.  Complications: None  Findings: Raised lesion of the posterior fourchette that extends posteriorly on the left perineal body with acetowhite changes. Additional aceowhite changes without overt lesion noted on the bilateral medial labia minor more anteriorly   Colposcopic Impression: VIN3  LABORATORY AND RADIOLOGIC DATA: Outside medical records were reviewed to synthesize the above history, along  with the history and physical obtained during the visit.  Outside laboratory, pathology, and imaging reports were reviewed, with pertinent results below.  I personally reviewed the outside images.  WBC  Date Value Ref Range Status  12/21/2020 5.4 4.0 - 10.5 K/uL Final   Hemoglobin  Date Value Ref Range Status  12/21/2020 14.9 12.0 - 15.0 g/dL Final   HCT  Date Value Ref Range Status  12/21/2020 41.6 36.0 - 46.0 % Final   Platelets  Date Value Ref Range Status  12/21/2020 261.0 150.0 - 400.0 K/uL Final   Creat  Date Value Ref Range Status  05/15/2020 0.71 0.50 - 1.05 mg/dL Final    Comment:    For patients >16 years of age, the reference limit for Creatinine is approximately 13% higher for people identified as African-American. .    Creatinine, Ser  Date Value Ref Range Status  05/02/2021 0.74 0.40 - 1.20 mg/dL Final   AST  Date Value Ref Range Status  05/02/2021 17 0 - 37 U/L Final   ALT  Date Value Ref Range Status  05/02/2021 16 0 - 35 U/L Final    Surgical pathology (04/04/22): FINAL MICROSCOPIC DIAGNOSIS:   A. ENDOCERVIX, CURETTAGE:  - Scant, benign endocervical mucosa.  - Negative for dysplasia or malignancy.   B. CERVIX, 12 O'CLOCK, BIOPSY:  - Scant, fragments of squamous mucosa with koilocytic effect (CIN-1).  - Scant fragments of endocervical cells with reactive changes.  - Negative for malignancy.  - See comment.   C. PERINEUM, LEFT, BIOPSY:  - Severe squamous dysplasia with condylomatous  features.  - Lateral margin involved.  - Negative for malignancy.

## 2022-05-06 ENCOUNTER — Other Ambulatory Visit: Payer: Self-pay

## 2022-05-06 ENCOUNTER — Other Ambulatory Visit: Payer: Self-pay | Admitting: Gynecologic Oncology

## 2022-05-06 ENCOUNTER — Inpatient Hospital Stay: Payer: 59 | Attending: Psychiatry | Admitting: Psychiatry

## 2022-05-06 ENCOUNTER — Inpatient Hospital Stay (HOSPITAL_BASED_OUTPATIENT_CLINIC_OR_DEPARTMENT_OTHER): Payer: 59 | Admitting: Gynecologic Oncology

## 2022-05-06 ENCOUNTER — Encounter: Payer: Self-pay | Admitting: Psychiatry

## 2022-05-06 VITALS — BP 116/56 | HR 74 | Temp 98.6°F | Resp 16 | Ht 67.0 in | Wt 189.0 lb

## 2022-05-06 DIAGNOSIS — K581 Irritable bowel syndrome with constipation: Secondary | ICD-10-CM | POA: Insufficient documentation

## 2022-05-06 DIAGNOSIS — M810 Age-related osteoporosis without current pathological fracture: Secondary | ICD-10-CM | POA: Diagnosis not present

## 2022-05-06 DIAGNOSIS — K219 Gastro-esophageal reflux disease without esophagitis: Secondary | ICD-10-CM | POA: Insufficient documentation

## 2022-05-06 DIAGNOSIS — D071 Carcinoma in situ of vulva: Secondary | ICD-10-CM

## 2022-05-06 DIAGNOSIS — Z79899 Other long term (current) drug therapy: Secondary | ICD-10-CM | POA: Insufficient documentation

## 2022-05-06 LAB — SURGICAL PATHOLOGY

## 2022-05-06 MED ORDER — IBUPROFEN 800 MG PO TABS: 800.0000 mg | ORAL_TABLET | Freq: Three times a day (TID) | ORAL | 0 refills | Status: DC | PRN

## 2022-05-06 MED ORDER — SENNOSIDES-DOCUSATE SODIUM 8.6-50 MG PO TABS: 2.0000 | ORAL_TABLET | Freq: Every day | ORAL | 0 refills | Status: DC

## 2022-05-06 MED ORDER — TRAMADOL HCL 50 MG PO TABS: 50.0000 mg | ORAL_TABLET | Freq: Four times a day (QID) | ORAL | 0 refills | Status: DC | PRN

## 2022-05-06 NOTE — Patient Instructions (Addendum)
Preparing for your Surgery  Plan for surgery on May 28, 2022 with Dr. [name] at [facility] will be scheduled for examination under anesthesia, simple partial vulvectomy, CO2 laser application to the vulva.   Pre-operative Testing -You will receive a phone call from presurgical testing at [facility] to discuss surgery instructions and arrange for lab work if needed.  -Bring your insurance card, copy of an advanced directive if applicable, medication list.  -You should not be taking blood thinners or aspirin at least ten days prior to surgery unless instructed by your surgeon.  -Do not take supplements such as fish oil (omega 3), red yeast rice, turmeric before your surgery. You want to avoid medications with aspirin in them including headache powders such as BC or Goody's), Excedrin migraine.  Day Before Surgery at [facility] will be advised you can have clear liquids up until 3 hours before your surgery.    Your role in recovery Your role is to become active as soon as directed by your doctor, while still giving yourself time to heal.  Rest when you feel tired. You will be asked to do the following in order to speed your recovery:  - Cough and breathe deeply. This helps to clear and expand your lungs and can prevent pneumonia after surgery.  - [place]. Do mild physical activity. Walking or moving your legs help your circulation and body functions return to normal. Do not try to get up or walk alone the first time after surgery.   -If you develop swelling on one leg or the other, pain in the back of your leg, redness/warmth in one of your legs, please call the office or go to the Emergency Room to have a doppler to rule out a blood clot. For shortness of breath, chest pain-seek care in the Emergency Room as soon as possible. - Actively manage your pain. Managing your pain lets you move in comfort. We will ask you to rate your  pain on a scale of zero to 10. It is your responsibility to tell your doctor or nurse where and how much you hurt so your pain can be treated.  Special Considerations -Your final pathology results from surgery should be available around one week after surgery and the results will be relayed to you when available.  -FMLA forms can be faxed to [phone] and please allow 5-7 business days for completion.  Pain Management After Surgery -You will be prescribed your pain medication and bowel regimen medications before surgery so that you can have these available when you are discharged from the hospital. The pain medication is for use ONLY AFTER surgery and a new prescription will not be given.   -Make sure that you have Tylenol and Ibuprofen at home IF [place] to use on a regular basis after surgery for pain control. We recommend alternating the medications every hour to six hours since they work differently and are processed in the body differently for pain relief.  -Review the attached handout on narcotic use and their risks and side effects.   Bowel Regimen -You will be prescribed Sennakot-S to take nightly to prevent constipation especially if you are taking the narcotic pain medication intermittently.  It is important to prevent constipation and drink adequate amounts of liquids. You can stop taking this medication when you are not taking pain medication and you are back on your normal bowel routine.  Risks of Surgery Risks of surgery are low but include bleeding, infection, damage to surrounding structures, re-operation, blood clots, and very rarely death.  AFTER SURGERY INSTRUCTIONS  Return to work:  1-2 weeks if applicable  We recommend purchasing several bags of frozen green peas and dividing them into ziploc bags. You will want to keep these in the freezer and have them ready to use as ice packs to the vulvar incision. Once the ice pack is no longer  cold, you can get another from the freezer. The frozen peas mold to your body better than a regular ice pack.   Activity: 1. Be up and out of the bed during the day.  Take a nap if needed.  You may walk up steps but be careful and use the hand rail.  Stair climbing will tire you more than you think, you may need to stop part way and rest.   2. No lifting or straining for 4 weeks over 10 pounds. No pushing, pulling, straining for 4 weeks if possible.  3. No driving for minimum 24 hours after surgery.  Do not drive if you are taking narcotic pain medicine and make sure that your reaction time has returned.   4. You can shower as soon as the next day after surgery. Shower daily. No tub baths or submerging your body in water until cleared by your surgeon. If you have the soap that was given to you by pre-surgical testing that was used before surgery, you do not need to use it afterwards because this can irritate your incisions.   5. No sexual activity and nothing in the vagina for 4 weeks.  6. You may experience vaginal spotting and discharge after surgery.  The spotting is normal but if you experience heavy bleeding, call our office.  7. Take Tylenol or ibuprofen first for pain if you are able to take these medications and only use narcotic pain medication for severe pain not relieved by the Tylenol or Ibuprofen.  Monitor your Tylenol intake to a max of 4,000 mg in a 24 hour period. You can alternate these medications after surgery.  Diet: 1. Low sodium Heart Healthy Diet is recommended but you are cleared to resume your normal (before surgery) diet after your procedure.  2. It is safe to use a laxative, such as Miralax or Colace, if you have difficulty moving your bowels. You have been prescribed Sennakot at bedtime every evening to keep bowel movements regular and to prevent constipation.    Wound Care: 1. Keep clean and dry.  Shower daily.  Reasons to call the Doctor: Fever - Oral  temperature greater than 100.4 degrees Fahrenheit Foul-smelling vaginal discharge Difficulty urinating Nausea and vomiting Increased pain at the site of the incision that is unrelieved with pain medicine. Difficulty breathing with or without chest pain New calf pain especially if only on one side Sudden, continuing increased vaginal bleeding with or without clots.   Contacts: For questions or concerns you should contact:  Dr. [name] at [place] [facility], NP at [phone]  After Hours: call [phone] and have the GYN Oncologist paged/contacted (after 5 pm or on the weekends).  Messages sent via mychart are for non-urgent matters and are not responded to after hours so for urgent needs, please call the after hours number.

## 2022-05-07 NOTE — Patient Instructions (Signed)
Preparing for your Surgery   Plan for surgery on May 28, 2022 with Dr. [name] at [place] will be scheduled for examination under anesthesia, simple partial vulvectomy, CO2 laser application to the vulva.    Pre-operative Testing -You will receive a phone call from presurgical testing at [facility] to discuss surgery instructions and arrange for lab work if needed.   -Bring your insurance card, copy of an advanced directive if applicable, medication list.   -You should not be taking blood thinners or aspirin at least ten days prior to surgery unless instructed by your surgeon.   -Do not take supplements such as fish oil (omega 3), red yeast rice, turmeric before your surgery. You want to avoid medications with aspirin in them including headache powders such as BC or Goody's), Excedrin migraine.   Day Before Surgery at [place] will be advised you can have clear liquids up until 3 hours before your surgery.     Your role in recovery Your role is to become active as soon as directed by your doctor, while still giving yourself time to heal.  Rest when you feel tired. You will be asked to do the following in order to speed your recovery:   - Cough and breathe deeply. This helps to clear and expand your lungs and can prevent pneumonia after surgery.  - [place]. Do mild physical activity. Walking or moving your legs help your circulation and body functions return to normal. Do not try to get up or walk alone the first time after surgery.   -If you develop swelling on one leg or the other, pain in the back of your leg, redness/warmth in one of your legs, please call the office or go to the Emergency Room to have a doppler to rule out a blood clot. For shortness of breath, chest pain-seek care in the Emergency Room as soon as possible. - Actively manage your pain. Managing your pain lets you move in comfort. We will ask you to  rate your pain on a scale of zero to 10. It is your responsibility to tell your doctor or nurse where and how much you hurt so your pain can be treated.   Special Considerations -Your final pathology results from surgery should be available around one week after surgery and the results will be relayed to you when available.   -FMLA forms can be faxed to [phone] and please allow 5-7 business days for completion.   Pain Management After Surgery -You will be prescribed your pain medication and bowel regimen medications before surgery so that you can have these available when you are discharged from the hospital. The pain medication is for use ONLY AFTER surgery and a new prescription will not be given.    -Make sure that you have Tylenol and Ibuprofen at home IF [place] to use on a regular basis after surgery for pain control. We recommend alternating the medications every hour to six hours since they work differently and are processed in the body differently for pain relief.   -Review the attached handout on narcotic use and their risks and side effects.    Bowel Regimen -You will be prescribed Sennakot-S to take nightly to prevent constipation especially if you are taking the narcotic pain medication intermittently.  It is important to prevent constipation and drink adequate amounts of liquids. You can stop taking this medication when you  are not taking pain medication and you are back on your normal bowel routine.   Risks of Surgery Risks of surgery are low but include bleeding, infection, damage to surrounding structures, re-operation, blood clots, and very rarely death.   AFTER SURGERY INSTRUCTIONS   Return to work:  1-2 weeks if applicable   We recommend purchasing several bags of frozen green peas and dividing them into ziploc bags. You will want to keep these in the freezer and have them ready to use as ice packs to the vulvar incision. Once the ice  pack is no longer cold, you can get another from the freezer. The frozen peas mold to your body better than a regular ice pack.    Activity: 1. Be up and out of the bed during the day.  Take a nap if needed.  You may walk up steps but be careful and use the hand rail.  Stair climbing will tire you more than you think, you may need to stop part way and rest.    2. No lifting or straining for 4 weeks over 10 pounds. No pushing, pulling, straining for 4 weeks if possible.   3. No driving for minimum 24 hours after surgery.  Do not drive if you are taking narcotic pain medicine and make sure that your reaction time has returned.    4. You can shower as soon as the next day after surgery. Shower daily. No tub baths or submerging your body in water until cleared by your surgeon. If you have the soap that was given to you by pre-surgical testing that was used before surgery, you do not need to use it afterwards because this can irritate your incisions.    5. No sexual activity and nothing in the vagina for 4 weeks.   6. You may experience vaginal spotting and discharge after surgery.  The spotting is normal but if you experience heavy bleeding, call our office.   7. Take Tylenol or ibuprofen first for pain if you are able to take these medications and only use narcotic pain medication for severe pain not relieved by the Tylenol or Ibuprofen.  Monitor your Tylenol intake to a max of 4,000 mg in a 24 hour period. You can alternate these medications after surgery.   Diet: 1. Low sodium Heart Healthy Diet is recommended but you are cleared to resume your normal (before surgery) diet after your procedure.   2. It is safe to use a laxative, such as Miralax or Colace, if you have difficulty moving your bowels. You have been prescribed Sennakot at bedtime every evening to keep bowel movements regular and to prevent constipation.     Wound Care: 1. Keep clean and dry.  Shower daily.   Reasons to call the  Doctor: Fever - Oral temperature greater than 100.4 degrees Fahrenheit Foul-smelling vaginal discharge Difficulty urinating Nausea and vomiting Increased pain at the site of the incision that is unrelieved with pain medicine. Difficulty breathing with or without chest pain New calf pain especially if only on one side Sudden, continuing increased vaginal bleeding with or without clots.   Contacts: For questions or concerns you should contact:   Dr. [name] at [place], NP at [phone]   After Hours: call [phone] and have the GYN Oncologist paged/contacted (after 5 pm or on the weekends).   Messages sent via mychart are for non-urgent matters and are not responded to after hours so for urgent needs, please call  the after hours number.

## 2022-05-07 NOTE — Progress Notes (Signed)
Patient here for new patient consultation with Dr. Ernestina Patches and for a pre-operative discussion prior to her scheduled surgery on May 28, 2022. She is scheduled for examination under anesthesia, simple partial vulvectomy, CO2 laser application to the vulva. The surgery was discussed in detail.  See after visit summary for additional details.     Discussed post-op pain management in detail including the aspects of the enhanced recovery pathway.  Advised her that a new prescription would be sent in for tramadol and it is only to be used for after her upcoming surgery.  We discussed the use of tylenol post-op and to monitor for a maximum of 4,000 mg in a 24 hour period.  Also prescribed sennakot to be used after surgery and to hold if having loose stools.  Discussed bowel regimen in detail.     Discussed measures to take at home to prevent DVT including frequent mobility.  Reportable signs and symptoms of DVT discussed. Post-operative instructions discussed and expectations for after surgery. Incisional care discussed.    5 minutes spent with the patient.  Verbalizing understanding of material discussed. No needs or concerns voiced at the end of the visit.   Advised patient to call for any needs.  Advised that her post-operative medications had been prescribed and could be picked up at any time.    This appointment is included in the global surgical bundle as pre-operative teaching and has no charge.

## 2022-05-13 NOTE — Progress Notes (Signed)
COVID Vaccine received:  _0  No _1  Yes Date of any COVID positive Test in last 90 days:  PCP - Owens Loffler, MD  Cardiologist -  none  Chest x-ray -  EKG -  no pertinent hx Stress Test -  ECHO -  Cardiac Cath -   PCR screen: _2  Ordered & Completed                      _3   No Order but Needs PROFEND                      _4   N/A for this surgery  Surgery Plan:  _5  Ambulatory                            _6  Outpatient in bed                            _7  Admit  Anesthesia:    _8  General  _9  Spinal                           _10   Choice _11   MAC  Bowel Prep - _12  No  _13   Yes _____________  Pacemaker / ICD device _14  No _15  Yes        Device order form faxed _16  No    _17   Yes      Faxed to:  Spinal Cord Stimulator:_18  No _19  Yes      (Remind patient to bring remote DOS) Other Implants:   History of Sleep Apnea? _20  No _21  Yes   CPAP used?- _22  No _23  Yes    Does the patient monitor blood sugar? _24  No _25  Yes  _26  N/A  Blood Thinner / Instructions: none Aspirin Instructions:  ERAS Protocol Ordered: _27  No  _28  Yes PRE-SURGERY _29  ENSURE  _30  G2  _31  No Drink Ordered  Patient is to be NPO after: 05:30 am  Comments: Patient is SAME DAY DISCHARGE. She has a child with Down's Syndrome.   Activity level: Patient can / can not climb a flight of stairs without difficulty; _32  No CP  _33  No SOB, but would have ______   Patient can / can not perform ADLs without assistance.   Anesthesia review: No pertinent hx.   Patient denies shortness of breath, fever, cough and chest pain at PAT appointment.  Patient verbalized understanding and agreement to the Pre-Surgical Instructions that were given to them at this PAT appointment. Patient was also educated of the need to review these PAT instructions again prior to his/her surgery.I reviewed the appropriate phone numbers to call if they have any and questions or concerns.

## 2022-05-13 NOTE — Patient Instructions (Signed)
SURGICAL WAITING ROOM VISITATION Patients having surgery or a procedure may have no more than 2 support people in the waiting area - these visitors may rotate in the visitor waiting room.   Children under the age of 31 must have an adult with them who is not the patient. If the patient needs to stay at the hospital during part of their recovery, the visitor guidelines for inpatient rooms apply.  PRE-OP VISITATION  Pre-op nurse will coordinate an appropriate time for 1 support person to accompany the patient in pre-op.  This support person may not rotate.  This visitor will be contacted when the time is appropriate for the visitor to come back in the pre-op area.  Please refer to the [facility] website for the visitor guidelines for Inpatients (after your surgery is over and you are in a regular room).  You are not required to quarantine at this time prior to your surgery. However, you must do this: Hand Hygiene often Do NOT share personal items Notify your provider if you are in close contact with someone who has COVID or you develop fever 100.4 or greater, new onset of sneezing, cough, sore throat, shortness of breath or body aches.  If you test positive for Covid or have been in contact with anyone that has tested positive in the last 10 days please notify you surgeon.    Your procedure is scheduled on:  Tuesday  May 28, 2022  Report to [facility] Main Entrance: [place] entrance where the [employer] is available.   Report to admitting at: 06:15  AM  +++++Call this number if you have any questions or problems the morning of surgery [phone]  Do not eat food after Midnight the night prior to your surgery/procedure.  After Midnight you may have the following liquids until   05:30 AM  DAY OF SURGERY  Clear Liquid Diet Water Black Coffee (sugar ok, NO MILK/CREAM OR CREAMERS)  Tea (sugar ok, NO MILK/CREAM OR CREAMERS) regular and decaf                              Plain Jell-O  with no fruit (NO RED)                                           Fruit ices (not with fruit pulp, NO RED)                                     Popsicles (NO RED)                                                                  Juice: apple, WHITE grape, WHITE cranberry Sports drinks like Gatorade or Powerade (NO RED)              FOLLOW BOWEL PREP AND ANY ADDITIONAL PRE OP INSTRUCTIONS YOU RECEIVED FROM YOUR SURGEON'S OFFICE!!!   Oral Hygiene is also important to reduce your risk of infection.        Remember -  BRUSH YOUR TEETH THE MORNING OF SURGERY WITH YOUR REGULAR TOOTHPASTE   Take ONLY these medicines the morning of surgery with A SIP OF WATER:  none                  You may not have any metal on your body including hair pins, jewelry, and body piercing  Do not wear make-up, lotions, powders, perfumes  or deodorant  Do not wear nail polish including gel and S&S, artificial / acrylic nails, or any other type of covering on natural nails including finger and toenails. If you have artificial nails, gel coating, etc., that needs to be removed by a nail salon, Please have this removed prior to surgery. Not doing so may mean that your surgery could be cancelled or delayed if the Surgeon or anesthesia staff feels like they are unable to monitor you safely.   Do not shave 48 hours prior to surgery to avoid nicks in your skin which may contribute to postoperative infections.   You may bring a small overnight bag with you on the day of surgery, only pack items that are not valuable .[facility] IS NOT RESPONSIBLE   FOR VALUABLES THAT ARE LOST OR STOLEN.   Patients discharged on the day of surgery will not be allowed to drive home.  Someone NEEDS to stay with you for the first 24 hours after anesthesia.  Do not bring your home medications to the hospital. The Pharmacy will dispense medications listed on your medication list to you during your admission in the Hospital.  Special  Instructions: Bring a copy of your healthcare power of attorney and living will documents the day of surgery, if you wish to have them scanned into your [facility] Medical Records- EPIC  Please read over the following fact sheets you were given: IF YOU HAVE QUESTIONS ABOUT YOUR PRE-OP INSTRUCTIONS, PLEASE CALL [phone]  ([place])   [place] - Preparing for Surgery Before surgery, you can play an important role.  Because skin is not sterile, your skin needs to be as free of germs as possible.  You can reduce the number of germs on your skin by washing with CHG (chlorahexidine gluconate) soap before surgery.  CHG is an antiseptic cleaner which kills germs and bonds with the skin to continue killing germs even after washing. Please DO NOT use if you have an allergy to CHG or antibacterial soaps.  If your skin becomes reddened/irritated stop using the CHG and inform your nurse when you arrive at Short Stay. Do not shave (including legs and underarms) for at least 48 hours prior to the first CHG shower.  You may shave your face/neck.  Please follow these instructions carefully:  1.  Shower with CHG Soap the night before surgery and the  morning of surgery.  2.  If you choose to wash your hair, wash your hair first as usual with your normal  shampoo.  3.  After you shampoo, rinse your hair and body thoroughly to remove the shampoo.                             4.  Use CHG as you would any other liquid soap.  You can apply chg directly to the skin and wash.  Gently with a scrungie or clean washcloth.  5.  Apply the CHG Soap to your body ONLY FROM THE NECK DOWN.   Do not use on face/ open  Wound or open sores. Avoid contact with eyes, ears mouth and genitals (private parts).                       Wash face,  Genitals (private parts) with your normal soap.             6.  Wash thoroughly, paying special attention to the area where your  surgery  will be performed.  7.  Thoroughly  rinse your body with warm water from the neck down.  8.  DO NOT shower/wash with your normal soap after using and rinsing off the CHG Soap.            9.  Pat yourself dry with a clean towel.            10.  Wear clean pajamas.            11.  Place clean sheets on your bed the night of your first shower and do not  sleep with pets.  ON THE DAY OF SURGERY : Do not apply any lotions/deodorants the morning of surgery.  Please wear clean clothes to the hospital/surgery center.    FAILURE TO FOLLOW THESE INSTRUCTIONS MAY RESULT IN THE CANCELLATION OF YOUR SURGERY  PATIENT SIGNATURE_________________________________  NURSE SIGNATURE__________________________________  ________________________________________________________________________

## 2022-05-15 ENCOUNTER — Other Ambulatory Visit: Payer: Self-pay

## 2022-05-15 ENCOUNTER — Encounter (HOSPITAL_COMMUNITY): Payer: Self-pay

## 2022-05-15 ENCOUNTER — Encounter (HOSPITAL_COMMUNITY)
Admission: RE | Admit: 2022-05-15 | Discharge: 2022-05-15 | Disposition: A | Discharge status: Home / Self Care | Payer: 59 | Source: Ambulatory Visit | Attending: Psychiatry | Admitting: Psychiatry

## 2022-05-15 DIAGNOSIS — Z01818 Encounter for other preprocedural examination: Secondary | ICD-10-CM | POA: Insufficient documentation

## 2022-05-15 HISTORY — DX: Other complications of anesthesia, initial encounter: T88.59XA

## 2022-05-16 ENCOUNTER — Other Ambulatory Visit: Payer: Self-pay | Admitting: Obstetrics and Gynecology

## 2022-05-16 DIAGNOSIS — Z1231 Encounter for screening mammogram for malignant neoplasm of breast: Secondary | ICD-10-CM

## 2022-05-27 ENCOUNTER — Encounter: Payer: Self-pay | Admitting: Internal Medicine

## 2022-05-27 ENCOUNTER — Telehealth: Payer: Self-pay | Admitting: Surgery

## 2022-05-27 ENCOUNTER — Encounter: Payer: Self-pay | Admitting: Gynecologic Oncology

## 2022-05-27 ENCOUNTER — Ambulatory Visit: Payer: 59 | Admitting: Internal Medicine

## 2022-05-27 VITALS — BP 108/70 | HR 72 | Temp 97.7°F | Ht 67.0 in | Wt 190.0 lb

## 2022-05-27 DIAGNOSIS — J0140 Acute pansinusitis, unspecified: Secondary | ICD-10-CM | POA: Diagnosis not present

## 2022-05-27 NOTE — Assessment & Plan Note (Signed)
Time course suggests viral infection Discussed analgesics and considering flonase If worsens later this week, will try empiric augmentin

## 2022-05-27 NOTE — Progress Notes (Signed)
Subjective:    Patient ID: Jennifer Velez, female    DOB: [birth year], 56 y.o.   MRN: [id number]  HPI Here due to respiratory infection  Was out of town ----started feeling "yuck" with sinus symptoms 3-4 days ago Prone to sinus infections Lots of maxillary pressure Ears are "roaring"--can feel something in there No fever, chills. Slight sweats Son also had sinus symptoms Some cough--but not productive Some post nasal drip now No SOB  COVID test negative x 2 Using OTC sinus med and menthol rub Saline spray also---not much help  Current Outpatient Medications on File Prior to Visit  Medication Sig Dispense Refill   Ascorbic Acid (VITAMIN C PO) Take 1,000 mg by mouth in the morning.     Cholecalciferol (VITAMIN D) 50 MCG (2000 UT) tablet Take 2,000 Units by mouth in the morning.     fluorouracil (EFUDEX) 5 % cream Apply 1 Application topically daily as needed (skin spots).     ibuprofen (ADVIL) 200 MG tablet Take 600 mg by mouth every 8 (eight) hours as needed (pain.).     ibuprofen (ADVIL) 800 MG tablet Take 1 tablet (800 mg total) by mouth every 8 (eight) hours as needed for mild pain. For AFTER surgery only (Patient not taking: Reported on 05/27/2022) 30 tablet 0   senna-docusate (SENOKOT-S) 8.6-50 MG tablet Take 2 tablets by mouth at bedtime. For AFTER surgery, do not take if having diarrhea (Patient not taking: Reported on 05/27/2022) 30 tablet 0   traMADol (ULTRAM) 50 MG tablet Take 1 tablet (50 mg total) by mouth every 6 (six) hours as needed. For AFTER surgery only, do not take and drive (Patient not taking: Reported on 05/27/2022) 10 tablet 0   No current facility-administered medications on file prior to visit.    Allergies  Allergen Reactions   Sulfonamide Derivatives Hives    Past Medical History:  Diagnosis Date   CIN III (cervical intraepithelial neoplasia III)    Complication of anesthesia    Endometriosis    Fibroid    GERD (gastroesophageal reflux  disease)    IBS (irritable bowel syndrome)    Osteoporosis 05/2017   T score -2.0 prior DEXA 2016 T score -2.8 AP spine    Past Surgical History:  Procedure Laterality Date   CERVICAL BIOPSY  W/ LOOP ELECTRODE EXCISION  1999   COLPOSCOPY     EXPLORATORY LAPAROTOMY  2004   myomectomy and excision endometriosis   PELVIC LAPAROSCOPY  1992   x2, for endometriosis   SKIN CANCER EXCISION     Squamous cell    Family History  Problem Relation Age of Onset   Diverticulitis Mother    Alzheimer's disease Mother    Alzheimer's disease Maternal Grandmother    Alzheimer's disease Maternal Aunt    Alzheimer's disease Maternal Aunt    Ulcerative colitis Maternal Aunt    Ulcerative colitis Cousin    Colon cancer Neg Hx    Esophageal cancer Neg Hx    Rectal cancer Neg Hx    Stomach cancer Neg Hx    Colon polyps Neg Hx    Breast cancer Neg Hx    Ovarian cancer Neg Hx    Endometrial cancer Neg Hx     Social History   Socioeconomic History   Marital status: Married    Spouse name: Not on file   Number of children: Not on file   Years of education: Not on file   Highest education level: Not on file  Occupational History   Occupation: Materials engineer  Tobacco Use   Smoking status: Never   Smokeless tobacco: Never  Vaping Use   Vaping Use: Never used  Substance and Sexual Activity   Alcohol use: No    Alcohol/week: 0.0 standard drinks of alcohol   Drug use: No   Sexual activity: Not Currently    Birth control/protection: Post-menopausal    Comment: 1st intercourse 56 yo-5 partners  Other Topics Concern   Not on file  Social History Narrative   3 adopted children, 2 boys and 1 girl   Social Determinants of Radio broadcast assistant Strain: Not on file  Food Insecurity: Not on file  Transportation Needs: Not on file  Physical Activity: Not on file  Stress: Not on file  Social Connections: Not on file  Intimate Partner Violence: Not on file   Review of Systems No  N/V Able to eat okay     Objective:   Physical Exam Constitutional:      Appearance: Normal appearance.  HENT:     Head:     Comments: Maxillary >> frontal tenderness    Right Ear: Tympanic membrane and ear canal normal.     Left Ear: Tympanic membrane and ear canal normal.     Nose: Congestion present.     Mouth/Throat:     Pharynx: No oropharyngeal exudate or posterior oropharyngeal erythema.  Neck:     Comments: Some tenderness in anterior cervical area--but no glands Pulmonary:     Effort: Pulmonary effort is normal.     Breath sounds: Normal breath sounds. No wheezing or rales.  Musculoskeletal:     Cervical back: Neck supple.  Lymphadenopathy:     Cervical: No cervical adenopathy.  Neurological:     Mental Status: She is alert.            Assessment & Plan:

## 2022-05-27 NOTE — Telephone Encounter (Signed)
Called patient to let her know of new surgery date of 12/5. Advised patient to call our office Thursday to confirm she is feeling better and surgery date doesn't need to be further pushed back. Patient verbalized understanding and stated she would call our office Thursday to confirm.

## 2022-05-27 NOTE — Progress Notes (Signed)
Patient called Jennifer Velez Short stay to cancel her procedure for tomorrow due to a head cold. Patient stated that she attempted to call Dr newtons office but no luck she did state that she left a message

## 2022-05-27 NOTE — Telephone Encounter (Signed)
Patient left after hours message stating she has bad head cold and needs to reschedule her surgery. Patient has tested negative for covid. Discussed with Dr Ernestina Patches and Joylene John, NP and called patient to inform her our office will work on rescheduling her surgery and would call her back with surgery dates. Patient verbalized understanding and had no further concerns at this time.

## 2022-05-28 DIAGNOSIS — N903 Dysplasia of vulva, unspecified: Secondary | ICD-10-CM

## 2022-05-29 ENCOUNTER — Other Ambulatory Visit: Payer: Self-pay

## 2022-05-29 NOTE — Progress Notes (Signed)
Updated date of surgery: 06/04/22  Updated time of arrival: 0515 AM  Patient will be discharged from hospital and monitored at home for 24 hours by: Mallie Mussel (husband) [phone]  Patient denies any changes in allergies, medications, medical history since pre op appointment on: 05/15/22  Patient had a cold on 05/27/22 was seen by PCP no fever, clear phlegm or nasal drainage. PCP instructed her to treat with OTC medications no antibiotic given. Patient states she is feeling better.  Pre op instructions reviewed, follow up questions addressed and patient verbalized understanding at this time.

## 2022-06-03 ENCOUNTER — Telehealth: Payer: Self-pay | Admitting: Surgery

## 2022-06-03 NOTE — Telephone Encounter (Signed)
Telephone call to check on pre-operative status.  Patient compliant with pre-operative instructions.  Reinforced nothing to eat after midnight. Clear liquids until 4:15am. Patient to arrive at 5:15am.  No questions or concerns voiced.  Instructed to call for any needs.

## 2022-06-03 NOTE — Anesthesia Preprocedure Evaluation (Signed)
Anesthesia Evaluation  Patient identified by MRN, date of birth, ID band Patient awake    Reviewed: Allergy & Precautions, NPO status , Patient's Chart, lab work & pertinent test results  Airway Mallampati: II  TM Distance: >3 FB Neck ROM: Full    Dental no notable dental hx. (+) Teeth Intact, Dental Advisory Given   Pulmonary neg pulmonary ROS   Pulmonary exam normal breath sounds clear to auscultation       Cardiovascular negative cardio ROS Normal cardiovascular exam Rhythm:Regular Rate:Normal     Neuro/Psych negative neurological ROS  negative psych ROS   GI/Hepatic Neg liver ROS,GERD  ,,  Endo/Other  negative endocrine ROS    Renal/GU negative Renal ROS  negative genitourinary   Musculoskeletal negative musculoskeletal ROS (+)    Abdominal   Peds  Hematology negative hematology ROS (+)   Anesthesia Other Findings VIN3  Reproductive/Obstetrics                             Anesthesia Physical Anesthesia Plan  ASA: 2  Anesthesia Plan: General   Post-op Pain Management: Tylenol PO (pre-op)*   Induction: Intravenous  PONV Risk Score and Plan: 3 and Ondansetron, Dexamethasone and Midazolam  Airway Management Planned: LMA and Oral ETT  Additional Equipment:   Intra-op Plan:   Post-operative Plan: Extubation in OR  Informed Consent: I have reviewed the patients History and Physical, chart, labs and discussed the procedure including the risks, benefits and alternatives for the proposed anesthesia with the patient or authorized representative who has indicated his/her understanding and acceptance.     Dental advisory given  Plan Discussed with: CRNA  Anesthesia Plan Comments:        Anesthesia Quick Evaluation

## 2022-06-04 ENCOUNTER — Encounter (HOSPITAL_COMMUNITY): Payer: Self-pay | Admitting: Psychiatry

## 2022-06-04 ENCOUNTER — Other Ambulatory Visit: Payer: Self-pay

## 2022-06-04 ENCOUNTER — Ambulatory Visit (HOSPITAL_COMMUNITY)
Admission: RE | Admit: 2022-06-04 | Discharge: 2022-06-04 | Disposition: A | Discharge status: Home / Self Care | Payer: 59 | Attending: Psychiatry | Admitting: Psychiatry

## 2022-06-04 ENCOUNTER — Ambulatory Visit (HOSPITAL_COMMUNITY): Payer: 59 | Admitting: Anesthesiology

## 2022-06-04 ENCOUNTER — Encounter (HOSPITAL_COMMUNITY)
Admission: RE | Disposition: A | Discharge status: Home / Self Care | Payer: Self-pay | Source: Home / Self Care | Attending: Psychiatry

## 2022-06-04 ENCOUNTER — Ambulatory Visit (HOSPITAL_BASED_OUTPATIENT_CLINIC_OR_DEPARTMENT_OTHER): Payer: 59 | Admitting: Anesthesiology

## 2022-06-04 ENCOUNTER — Telehealth: Payer: Self-pay | Admitting: Surgery

## 2022-06-04 DIAGNOSIS — N903 Dysplasia of vulva, unspecified: Secondary | ICD-10-CM

## 2022-06-04 DIAGNOSIS — D071 Carcinoma in situ of vulva: Secondary | ICD-10-CM

## 2022-06-04 DIAGNOSIS — K219 Gastro-esophageal reflux disease without esophagitis: Secondary | ICD-10-CM | POA: Insufficient documentation

## 2022-06-04 HISTORY — PX: VULVECTOMY: SHX1086

## 2022-06-04 HISTORY — PX: CO2 LASER APPLICATION: SHX5778

## 2022-06-04 SURGERY — WIDE EXCISION VULVECTOMY
Anesthesia: General

## 2022-06-04 MED ORDER — ORAL CARE MOUTH RINSE: 15.0000 mL | Freq: Once | OROMUCOSAL | Status: AC

## 2022-06-04 MED ORDER — FERRIC SUBSULFATE 259 MG/GM EX SOLN
CUTANEOUS | Status: AC
  Filled 2022-06-04: qty 8

## 2022-06-04 MED ORDER — FENTANYL CITRATE PF 50 MCG/ML IJ SOSY
PREFILLED_SYRINGE | INTRAMUSCULAR | Status: AC
  Filled 2022-06-04: qty 1

## 2022-06-04 MED ORDER — DEXAMETHASONE SODIUM PHOSPHATE 4 MG/ML IJ SOLN: 4.0000 mg | INTRAMUSCULAR | Status: DC

## 2022-06-04 MED ORDER — KETOROLAC TROMETHAMINE 15 MG/ML IJ SOLN: 15.0000 mg | INTRAMUSCULAR | Status: DC

## 2022-06-04 MED ORDER — ACETIC ACID 5 % SOLN
Status: AC
  Filled 2022-06-04: qty 50

## 2022-06-04 MED ORDER — FENTANYL CITRATE (PF) 100 MCG/2ML IJ SOLN
INTRAMUSCULAR | Status: AC
  Filled 2022-06-04: qty 2

## 2022-06-04 MED ORDER — ONDANSETRON HCL 4 MG/2ML IJ SOLN
INTRAMUSCULAR | Status: DC | PRN
  Administered 2022-06-04: 4 mg via INTRAVENOUS

## 2022-06-04 MED ORDER — LIDOCAINE HCL 1 % IJ SOLN
INTRAMUSCULAR | Status: AC
  Filled 2022-06-04: qty 20

## 2022-06-04 MED ORDER — KETOROLAC TROMETHAMINE 30 MG/ML IJ SOLN
INTRAMUSCULAR | Status: DC | PRN
  Administered 2022-06-04: 15 mg via INTRAVENOUS

## 2022-06-04 MED ORDER — CHLORHEXIDINE GLUCONATE 0.12 % MT SOLN
15.0000 mL | Freq: Once | OROMUCOSAL | Status: AC
  Administered 2022-06-04: 15 mL via OROMUCOSAL

## 2022-06-04 MED ORDER — PROPOFOL 500 MG/50ML IV EMUL
INTRAVENOUS | Status: AC
  Filled 2022-06-04: qty 150

## 2022-06-04 MED ORDER — ACETAMINOPHEN 500 MG PO TABS
1000.0000 mg | ORAL_TABLET | ORAL | Status: AC
  Administered 2022-06-04: 1000 mg via ORAL
  Filled 2022-06-04: qty 2

## 2022-06-04 MED ORDER — KETOROLAC TROMETHAMINE 30 MG/ML IJ SOLN
INTRAMUSCULAR | Status: AC
  Filled 2022-06-04: qty 1

## 2022-06-04 MED ORDER — FENTANYL CITRATE (PF) 100 MCG/2ML IJ SOLN
INTRAMUSCULAR | Status: DC | PRN
  Administered 2022-06-04: 50 ug via INTRAVENOUS

## 2022-06-04 MED ORDER — FENTANYL CITRATE PF 50 MCG/ML IJ SOSY
PREFILLED_SYRINGE | INTRAMUSCULAR | Status: AC
  Administered 2022-06-04: 50 ug via INTRAVENOUS
  Filled 2022-06-04: qty 1

## 2022-06-04 MED ORDER — SILVER SULFADIAZINE 1 % EX CREA
1.0000 | TOPICAL_CREAM | Freq: Two times a day (BID) | CUTANEOUS | Status: DC | PRN
  Filled 2022-06-04: qty 50

## 2022-06-04 MED ORDER — PROPOFOL 10 MG/ML IV BOLUS
INTRAVENOUS | Status: DC | PRN
  Administered 2022-06-04: 150 mg via INTRAVENOUS

## 2022-06-04 MED ORDER — MIDAZOLAM HCL 5 MG/5ML IJ SOLN
INTRAMUSCULAR | Status: DC | PRN
  Administered 2022-06-04: 2 mg via INTRAVENOUS

## 2022-06-04 MED ORDER — GABAPENTIN 300 MG PO CAPS
300.0000 mg | ORAL_CAPSULE | ORAL | Status: AC
  Administered 2022-06-04: 300 mg via ORAL
  Filled 2022-06-04: qty 1

## 2022-06-04 MED ORDER — FENTANYL CITRATE PF 50 MCG/ML IJ SOSY
25.0000 ug | PREFILLED_SYRINGE | INTRAMUSCULAR | Status: DC | PRN
  Administered 2022-06-04: 50 ug via INTRAVENOUS

## 2022-06-04 MED ORDER — LIDOCAINE HCL (CARDIAC) PF 100 MG/5ML IV SOSY
PREFILLED_SYRINGE | INTRAVENOUS | Status: DC | PRN
  Administered 2022-06-04: 60 mg via INTRAVENOUS

## 2022-06-04 MED ORDER — DEXAMETHASONE SODIUM PHOSPHATE 10 MG/ML IJ SOLN
INTRAMUSCULAR | Status: AC
  Filled 2022-06-04: qty 1

## 2022-06-04 MED ORDER — SCOPOLAMINE 1 MG/3DAYS TD PT72
1.0000 | MEDICATED_PATCH | TRANSDERMAL | Status: DC
  Administered 2022-06-04: 1.5 mg via TRANSDERMAL
  Filled 2022-06-04: qty 1

## 2022-06-04 MED ORDER — MIDAZOLAM HCL 2 MG/2ML IJ SOLN
INTRAMUSCULAR | Status: AC
  Filled 2022-06-04: qty 2

## 2022-06-04 MED ORDER — LIDOCAINE HCL 1 % IJ SOLN
INTRAMUSCULAR | Status: DC | PRN
  Administered 2022-06-04: 20 mL

## 2022-06-04 MED ORDER — LACTATED RINGERS IV SOLN: INTRAVENOUS | Status: DC

## 2022-06-04 MED ORDER — DEXAMETHASONE SODIUM PHOSPHATE 10 MG/ML IJ SOLN
INTRAMUSCULAR | Status: DC | PRN
  Administered 2022-06-04: 8 mg via INTRAVENOUS

## 2022-06-04 MED ORDER — LIDOCAINE HCL (PF) 2 % IJ SOLN
INTRAMUSCULAR | Status: AC
  Filled 2022-06-04: qty 5

## 2022-06-04 MED ORDER — ACETIC ACID 5 % SOLN
Status: DC | PRN
  Administered 2022-06-04: 1 via TOPICAL

## 2022-06-04 MED ORDER — ONDANSETRON HCL 4 MG/2ML IJ SOLN
INTRAMUSCULAR | Status: AC
  Filled 2022-06-04: qty 2

## 2022-06-04 MED ORDER — PROPOFOL 10 MG/ML IV BOLUS
INTRAVENOUS | Status: AC
  Filled 2022-06-04: qty 20

## 2022-06-04 MED ORDER — PROPOFOL 500 MG/50ML IV EMUL
INTRAVENOUS | Status: DC | PRN
  Administered 2022-06-04 (×3): 150 ug/kg/min via INTRAVENOUS

## 2022-06-04 MED ORDER — 0.9 % SODIUM CHLORIDE (POUR BTL) OPTIME
TOPICAL | Status: DC | PRN
  Administered 2022-06-04: 1000 mL

## 2022-06-04 SURGICAL SUPPLY — 40 items
BLADE SURG 15 STRL LF DISP TIS (BLADE) IMPLANT
BLADE SURG 15 STRL SS (BLADE) ×1
CATH ROBINSON RED A/P 16FR (CATHETERS) IMPLANT
DEPRESSOR TONGUE 6 IN STERILE (GAUZE/BANDAGES/DRESSINGS) ×1 IMPLANT
DRAPE HYSTEROSCOPY (MISCELLANEOUS) IMPLANT
DRAPE UNDERBUTTOCKS STRL (DISPOSABLE) IMPLANT
DRSG TELFA 3X8 NADH STRL (GAUZE/BANDAGES/DRESSINGS) IMPLANT
ELECT BALL LEEP 3MM BLK (ELECTRODE) IMPLANT
GLOVE BIO SURGEON STRL SZ 6 (GLOVE) ×1 IMPLANT
GOWN STRL REUS W/ TWL LRG LVL3 (GOWN DISPOSABLE) ×1 IMPLANT
GOWN STRL REUS W/TWL LRG LVL3 (GOWN DISPOSABLE) ×1
LEGGING LITHOTOMY PAIR STRL (DRAPES) IMPLANT
NDL SPNL 22GX7 QUINCKE BK (NEEDLE) IMPLANT
NEEDLE HYPO 22GX1.5 SAFETY (NEEDLE) IMPLANT
NEEDLE SPNL 22GX7 QUINCKE BK (NEEDLE) IMPLANT
NS IRRIG 1000ML POUR BTL (IV SOLUTION) IMPLANT
PACK BASIC VI WITH GOWN DISP (CUSTOM PROCEDURE TRAY) IMPLANT
PACK LITHOTOMY IV (CUSTOM PROCEDURE TRAY) ×1 IMPLANT
PACK PERINEAL COLD (PAD) IMPLANT
PAD PREP 24X48 CUFFED NSTRL (MISCELLANEOUS) ×1 IMPLANT
PUNCH BIOPSY 3 (MISCELLANEOUS) IMPLANT
SCOPETTES 8  STERILE (MISCELLANEOUS)
SCOPETTES 8 STERILE (MISCELLANEOUS) ×2 IMPLANT
SOL PREP POV-IOD 4OZ 10% (MISCELLANEOUS) ×1 IMPLANT
SUT VIC AB 0 CT1 36 (SUTURE) IMPLANT
SUT VIC AB 2-0 CT1 18 (SUTURE) IMPLANT
SUT VIC AB 2-0 SH 27 (SUTURE)
SUT VIC AB 2-0 SH 27XBRD (SUTURE) IMPLANT
SUT VIC AB 3-0 PS2 18 (SUTURE)
SUT VIC AB 3-0 PS2 18XBRD (SUTURE) IMPLANT
SUT VIC AB 3-0 SH 27 (SUTURE)
SUT VIC AB 3-0 SH 27X BRD (SUTURE) IMPLANT
SUT VIC AB 4-0 PS2 18 (SUTURE) IMPLANT
SUT VICRYL 0 UR6 27IN ABS (SUTURE) IMPLANT
SYR CONTROL 10ML LL (SYRINGE) ×1 IMPLANT
TAPE UMBILICAL 1/8 X36 TWILL (MISCELLANEOUS) IMPLANT
TOWEL OR 17X26 10 PK STRL BLUE (TOWEL DISPOSABLE) IMPLANT
TUBING CONNECTING 10 (TUBING) IMPLANT
VACUUM HOSE 7/8X10 W/ WAND (MISCELLANEOUS) ×1 IMPLANT
WATER STERILE IRR 500ML POUR (IV SOLUTION) ×1 IMPLANT

## 2022-06-04 NOTE — Interval H&P Note (Signed)
History and Physical Interval Note:  06/04/2022 7:22 AM  Jennifer Velez  has presented today for surgery, with the diagnosis of vin 3.  The various methods of treatment have been discussed with the patient and family. After consideration of risks, benefits and other options for treatment, the patient has consented to  Procedure(s): WIDE EXCISION VULVECTOMY (N/A) CO2 LASER APPLICATION (N/A) as a surgical intervention.  The patient's history has been reviewed, patient examined, no change in status, stable for surgery.  I have reviewed the patient's chart and labs.  Questions were answered to the patient's satisfaction.     Laurisa Sahakian

## 2022-06-04 NOTE — Op Note (Addendum)
GYNECOLOGIC ONCOLOGY OPERATIVE NOTE  Date of Service: 06/04/2022  Preoperative Diagnosis: Vulvar intraepithelial neoplasia 3  Postoperative Diagnosis: Same  Procedures: Simple partial vulvectomy with rhomboid rotational flap and CO2 laser ablation   Surgeon: Jennifer Bell, MD  Assistants: Lahoma Crocker, MD  Anesthesia: General  Estimated Blood Loss: 20 mL   Fluids: 1000 ml, crystalloid  Urine Output: 200 ml, clear yellow  Findings: Following application of acetic acid, broad on the midline of the perineal body approximately 2x3cm area, extending toward the left, with acetowhite changes. Additional acetowhite changes of multifocal lesions extending to the right perineal body/posterior vulva and posterior labia minora. Given broad involvement across perineal body, decision made to excise area with the high grade changes (and previously biopsied) and treat with CO2 laser the multifocal areas of the posterior right vulva. Given extent excised across perineal body, decision made to proceed with rhomboid rotational flap on the left to aid in closure defect.  Specimens:  ID Type Source Tests Collected by Time Destination  1 : posterior vulva long stitch at 12 o'clock, short stitch at 3 o'clock Tissue PATH Gyn biopsy SURGICAL PATHOLOGY Jennifer Bell, MD 72/03/210 1552     Complications:  None  Indications for Procedure: Jennifer Velez is a 56 y.o. woman who with biopsy proven VIN3.  Prior to the procedure, all risks, benefits, and alternatives were discussed and informed surgical consent was signed.  Procedure: Patient was taken to the operating room where general anesthesia was achieved.  She was positioned in dorsal lithotomy and prepped and draped.  An in and out catheter was inserted into the bladder and the bladder was drained.  A thorough exam of the vulva was done after placing a moist sponge with acetic acid on the vulva with the findings as noted above. The area to be  removed was outlined with a marking pen with a 1cm margin. A scalpel was used to incise around the lesion. Allis clamps are used to grasp the lesion and a skinning vulvectomy was performed in the standard fashion with aid of cautery. After the lesion was removed, the wound bed was made hemostatic with cautery. Given the size of the defect across the perineal body (approximately 6x4cm), extending toward the left, decision was made to proceed with a rhomboid rotational flap to cover the left half of the defect. The flap was marked with a marking pen and incised with a scalpel. The flap was undermined with cautery. The flap was the rotated to cover the left half of the perineal body and was secured with several deep stitches of 2-0 vicryl. The remaining edges of the perineal body defect and the defect from the rotational flap were undermined and the edges were reapproximated with several subcutaneous and deep dermal stitches of 2-0 vicryl to bring the wound edges together. This was followed by 4-0 vicryl on the skin edges to re-appoximate the wound. Hemostasis was noted at the end of the procedure.   Acetic acid was then re-applied to note again the multifocal aceto-white lesions on the right perineal body and posterior right vulva and labia. This area was outlined with a marking pen with a 68m margin. Using the CO2 laser, energy was applied with 8 Watts of continuous power.  The ablation was performed down to a second surgical layer.  The wound bed was hemostatic. Silvadene cream was applied to the wound bed.   Patient tolerated the procedure well. Sponge, lap, and instrument counts were correct.  No perioperative antibiotics were  indicated for this procedure.  Patient was extubated and taken to the PACU in stable condition.  Jennifer Bell, MD Gynecologic Oncology

## 2022-06-04 NOTE — Transfer of Care (Signed)
Immediate Anesthesia Transfer of Care Note  Patient: Jennifer Velez  Procedure(s) Performed: WIDE EXCISION VULVECTOMY CO2 LASER APPLICATION  Patient Location: PACU  Anesthesia Type:General  Level of Consciousness: awake, drowsy, and patient cooperative  Airway & Oxygen Therapy: Patient Spontanous Breathing and Patient connected to face mask oxygen  Post-op Assessment: Report given to RN, Post -op Vital signs reviewed and stable, and Patient moving all extremities X 4  Post vital signs: Reviewed and stable  Last Vitals:  Vitals Value Taken Time  BP 128/72 06/04/22 0932  Temp 36.4 C 06/04/22 0933  Pulse 57 06/04/22 0934  Resp 11 06/04/22 0934  SpO2 100 % 06/04/22 0934  Vitals shown include unvalidated device data.  Last Pain:  Vitals:   06/04/22 0631  TempSrc:   PainSc: 0-No pain         Complications: No notable events documented.

## 2022-06-04 NOTE — Telephone Encounter (Signed)
Patient's husband called stating that since coming home from surgery this morning, patient is having some bleeding. States there was a small amount of blood on her pad when she got home from the hospital. She changed the pad and napped for a few hours. Upon waking up there was more blood, per husband approximately a quarter size in diameter. Denies bleeding that saturates a pad per hour. Patient advised that if bleeding becomes heavier than a heavy period and is saturating a pad per hour, to call our office. Husband had no further concerns at this time.

## 2022-06-04 NOTE — Discharge Instructions (Addendum)
Caring for your incisions Wound healing after vulvar surgery can be challenging, depending on the location of the incisions. Moisture and rubbing happens with daily activities, which affects vulvar wound healing. Urinating can be painful.  To keep your incisions clean:  Use your peri-bottle to wash your vulvar and anal areas with warm water. Do this after every time you urinate or have a bowel movement. Use a hand spray shower at least 2 times every day. Shower every day, using soap and water. To dry your incisions, pat them dry with a clean towel or use the "cool" setting on a hair dryer. Do not rub your incisions.  Here are some tips to make you more comfortable while your incision(s) are healing:  Apply ice packs to the affected area as directed. Wear loose fitting clothing. Wear underwear that's a size larger or men's boxer shorts. Keep the area open to air at night.  Your incisions may look like they're healed on the outside when you leave the hospital. They will not be healed on the inside. Before your post-op visit:  Do not lift anything heavier than 10 pounds (4.5 kilograms). Do not do any high-energy activities, such as jogging and tennis. Do not play any contact sports, such as football.  Call Dr. Romona Curls office if:  You have a fever of 100.5 F (38 C) or higher. You have chills. The area around your incision is starting to swell. Swelling around your incision is getting worse. You have increased discomfort in the area of your incisions. You have drainage or a foul odor (bad smell) from your incisions. You have bleeding from your incisions. You have trouble urinating (peeing). You have any problems you didn't expect.  You have any questions or concerns. Contact information Monday through Friday from 9 a.m. to 5 p.m., call the GYN Oncology office at [phone]. After 5 p.m., during the weekend, and on holidays, call [phone]. Ask to talk with the person on call  for Dr. Ernestina Patches

## 2022-06-04 NOTE — Anesthesia Procedure Notes (Signed)
Procedure Name: LMA Insertion Date/Time: 06/04/2022 7:42 AM  Performed by: Jonna Munro, CRNAPre-anesthesia Checklist: Patient identified, Emergency Drugs available, Suction available, Patient being monitored and Timeout performed Patient Re-evaluated:Patient Re-evaluated prior to induction Oxygen Delivery Method: Circle system utilized Preoxygenation: Pre-oxygenation with 100% oxygen Induction Type: IV induction LMA: LMA inserted LMA Size: 4.0 Number of attempts: 1 Placement Confirmation: positive ETCO2, CO2 detector and breath sounds checked- equal and bilateral Tube secured with: Tape Dental Injury: Teeth and Oropharynx as per pre-operative assessment

## 2022-06-04 NOTE — Anesthesia Postprocedure Evaluation (Signed)
Anesthesia Post Note  Patient: Jennifer Velez  Procedure(s) Performed: WIDE EXCISION VULVECTOMY CO2 LASER APPLICATION     Patient location during evaluation: PACU Anesthesia Type: General Level of consciousness: awake and alert Pain management: pain level controlled Vital Signs Assessment: post-procedure vital signs reviewed and stable Respiratory status: spontaneous breathing, nonlabored ventilation, respiratory function stable and patient connected to nasal cannula oxygen Cardiovascular status: blood pressure returned to baseline and stable Postop Assessment: no apparent nausea or vomiting Anesthetic complications: no  No notable events documented.  Last Vitals:  Vitals:   06/04/22 1020 06/04/22 1030  BP: (!) 162/79 (!) 149/66  Pulse: (!) 57 (!) 58  Resp: 14 10  Temp:    SpO2: 93% 93%    Last Pain:  Vitals:   06/04/22 1030  TempSrc:   PainSc: Asleep                 Zorion Nims L Samaya Boardley

## 2022-06-05 ENCOUNTER — Telehealth: Payer: Self-pay

## 2022-06-05 ENCOUNTER — Encounter (HOSPITAL_COMMUNITY): Payer: Self-pay | Admitting: Psychiatry

## 2022-06-05 LAB — SURGICAL PATHOLOGY

## 2022-06-05 NOTE — Telephone Encounter (Signed)
Spoke with husband. Patient's B/P is normal and her temp is 96.8. No concerns voiced during this call.

## 2022-06-05 NOTE — Telephone Encounter (Signed)
Post-op call placed to patient. She continues to have bright blood on maxi-pad but it is not saturating one pad per hour. She feels flushed but does not have a fever. Pain rated 7/10 with alternating tylenol and tramadol. Reviewed use of topical cream. Patient advised (per Dr. Delsa Sale) to not put cream on graft site.  Patient to call clinic this afternoon to update [place] on her status.

## 2022-06-07 ENCOUNTER — Telehealth: Payer: Self-pay

## 2022-06-07 NOTE — Telephone Encounter (Signed)
Patient called to report that she remains in pain after surgery. She rates pain at 8/10. Patient alternating tylenol and tramadol. She is using frozen peas to cool the peri area. We discussed signs and symptoms of infection which she denies at this point. Encouraged patient to use peri bottle when voiding. Offered support and encouragement.

## 2022-06-10 ENCOUNTER — Inpatient Hospital Stay: Payer: 59 | Attending: Psychiatry | Admitting: Psychiatry

## 2022-06-10 ENCOUNTER — Encounter: Payer: 59 | Admitting: Psychiatry

## 2022-06-10 ENCOUNTER — Telehealth: Payer: Self-pay | Admitting: Surgery

## 2022-06-10 VITALS — BP 141/63 | HR 81 | Temp 98.4°F | Resp 18 | Wt 183.2 lb

## 2022-06-10 DIAGNOSIS — Z9079 Acquired absence of other genital organ(s): Secondary | ICD-10-CM | POA: Insufficient documentation

## 2022-06-10 DIAGNOSIS — D071 Carcinoma in situ of vulva: Secondary | ICD-10-CM | POA: Insufficient documentation

## 2022-06-10 DIAGNOSIS — T8141XA Infection following a procedure, superficial incisional surgical site, initial encounter: Secondary | ICD-10-CM | POA: Insufficient documentation

## 2022-06-10 DIAGNOSIS — D069 Carcinoma in situ of cervix, unspecified: Secondary | ICD-10-CM

## 2022-06-10 DIAGNOSIS — L0390 Cellulitis, unspecified: Secondary | ICD-10-CM

## 2022-06-10 MED ORDER — OXYCODONE HCL 5 MG PO TABS: 5.0000 mg | ORAL_TABLET | ORAL | 0 refills | Status: DC | PRN

## 2022-06-10 MED ORDER — AMOXICILLIN-POT CLAVULANATE 875-125 MG PO TABS: 1.0000 | ORAL_TABLET | Freq: Two times a day (BID) | ORAL | 0 refills | Status: AC

## 2022-06-10 NOTE — Telephone Encounter (Signed)
Patient called stating she is still experience 8/10 pain and having bleeding. States pain is generalized but mainly graft site/peri area. She is changing her pad every time she goes to the bathroom but it is not fully saturated. Patient is eating and drinking ok, having regular bowel movements, and denies N/V. Patient concerned about ongoing symptoms and would like to come in and be seen by Dr Ernestina Patches, today if possible.

## 2022-06-10 NOTE — Progress Notes (Unsigned)
Gynecologic Oncology Return Clinic Visit  Date of Service: 06/10/2022 Referring Provider: Owens Loffler, MD [address] [place],  [place] [address]***  Assessment & Plan: Jennifer Velez is a 56 y.o. woman with Stage *** who is *** weeks s/p *** on ***.  Postop: - Pt recovering well from surgery and healing appropriately postoperatively - Intraoperative findings and pathology results reviewed. - Ongoing postoperative expectations and precautions reviewed. Continue with no lifting >10lbs through 6 weeks postoperatively - Pt works ***. Okay to return to work at [employer] - Given that uterus is in situ, pt advised that she should continue with pap smear screening per routine until age 65 if she continues with negative/low grade paps.  - Reviewed that after 12 months without menstrual cycles, she should not have any spotting or bleeding.  If this were to occur, she should be evaluated for postmenopausal bleeding.  ***  ***VTE Prophylaxis: - Khorana score = ***  RTC ***.  [name], MD Gynecologic Oncology   Medical Decision Making I personally spent  TOTAL *** minutes face-to-face and non-face-to-face in the care of this patient, which includes all pre, intra, and post visit time on the date of service.  *** minutes spent reviewing records prior to the visit *** Minutes in patient contact      *** minutes in other billable services *** minutes charting , conferring with consultants etc.   ----------------------- Reason for Visit: Postop***  Treatment History: Oncology History   No history exists.    Interval History: Pt reports that she is recovering well from surgery. She is using *** for pain. She is eating and drinking well. She is voiding without issue and having regular bowel movements.  3 tramadol left Pain Drainage, blood fluid ***  Past Medical/Surgical History: Past Medical History:  Diagnosis Date   CIN III (cervical intraepithelial neoplasia  III)    Complication of anesthesia    Endometriosis    Fibroid    GERD (gastroesophageal reflux disease)    IBS (irritable bowel syndrome)    Osteoporosis 05/2017   T score -2.0 prior DEXA 2016 T score -2.8 AP spine    Past Surgical History:  Procedure Laterality Date   CERVICAL BIOPSY  W/ LOOP ELECTRODE EXCISION  9476   CO2 LASER APPLICATION N/A 54/11/5033   Procedure: CO2 LASER APPLICATION;  Surgeon: [name], MD;  Location: [facility];  Service: Gynecology;  Laterality: N/A;   COLPOSCOPY     EXPLORATORY LAPAROTOMY  2004   myomectomy and excision endometriosis   PELVIC LAPAROSCOPY  1992   x2, for endometriosis   SKIN CANCER EXCISION     Squamous cell   VULVECTOMY N/A 06/04/2022   Procedure: WIDE EXCISION VULVECTOMY;  Surgeon: [name], MD;  Location: [facility];  Service: Gynecology;  Laterality: N/A;    Family History  Problem Relation Age of Onset   Diverticulitis Mother    Alzheimer's disease Mother    Alzheimer's disease Maternal Grandmother    Alzheimer's disease Maternal Aunt    Alzheimer's disease Maternal Aunt    Ulcerative colitis Maternal Aunt    Ulcerative colitis Cousin    Colon cancer Neg Hx    Esophageal cancer Neg Hx    Rectal cancer Neg Hx    Stomach cancer Neg Hx    Colon polyps Neg Hx    Breast cancer Neg Hx    Ovarian cancer Neg Hx    Endometrial cancer Neg Hx     Social History  Socioeconomic History   Marital status: Married    Spouse name: Not on file   Number of children: Not on file   Years of education: Not on file   Highest education level: Not on file  Occupational History   Occupation: Home maker  Tobacco Use   Smoking status: Never   Smokeless tobacco: Never  Vaping Use   Vaping Use: Never used  Substance and Sexual Activity   Alcohol use: No    Alcohol/week: 0.0 standard drinks of alcohol   Drug use: No   Sexual activity: Not Currently    Birth control/protection: Post-menopausal    Comment: 1st intercourse 60  yo-5 partners  Other Topics Concern   Not on file  Social History Narrative   3 adopted children, 2 boys and 1 girl   Social Determinants of Radio broadcast assistant Strain: Not on file  Food Insecurity: Not on file  Transportation Needs: Not on file  Physical Activity: Not on file  Stress: Not on file  Social Connections: Not on file    Current Medications:  Current Outpatient Medications:    amoxicillin-clavulanate (AUGMENTIN) 875-125 MG tablet, Take 1 tablet by mouth 2 (two) times daily for 10 days., Disp: 20 tablet, Rfl: 0   oxyCODONE (OXY IR/ROXICODONE) 5 MG immediate release tablet, Take 1 tablet (5 mg total) by mouth every 4 (four) hours as needed for severe pain., Disp: 30 tablet, Rfl: 0   Ascorbic Acid (VITAMIN C PO), Take 1,000 mg by mouth in the morning., Disp: , Rfl:    Cholecalciferol (VITAMIN D) 50 MCG (2000 UT) tablet, Take 2,000 Units by mouth in the morning., Disp: , Rfl:    fluorouracil (EFUDEX) 5 % cream, Apply 1 Application topically daily as needed (skin spots)., Disp: , Rfl:    ibuprofen (ADVIL) 200 MG tablet, Take 600 mg by mouth every 8 (eight) hours as needed (pain.)., Disp: , Rfl:    ibuprofen (ADVIL) 800 MG tablet, Take 1 tablet (800 mg total) by mouth every 8 (eight) hours as needed for mild pain. For AFTER surgery only (Patient not taking: Reported on 05/27/2022), Disp: 30 tablet, Rfl: 0   senna-docusate (SENOKOT-S) 8.6-50 MG tablet, Take 2 tablets by mouth at bedtime. For AFTER surgery, do not take if having diarrhea (Patient not taking: Reported on 05/27/2022), Disp: 30 tablet, Rfl: 0   traMADol (ULTRAM) 50 MG tablet, Take 1 tablet (50 mg total) by mouth every 6 (six) hours as needed. For AFTER surgery only, do not take and drive (Patient not taking: Reported on 05/27/2022), Disp: 10 tablet, Rfl: 0  Review of Symptoms: Complete 10-system review is negative except ***as above in Interval History.  Physical Exam: BP (!) 141/63 (BP Location: Left Arm)  Comment: patient was lying down.  Pulse 81   Temp 98.4 F (36.9 C) (Oral)   Resp 18   Wt 183 lb 3.2 oz (83.1 kg)   LMP 11/02/2012   SpO2 97%   BMI 28.69 kg/m  General: ***Alert, oriented, no acute distress. HEENT: ***Normocephalic, atraumatic. Neck symmetric without masses. Sclera anicteric. ***Posterior oropharynx clear. Chest: ***Normal work of breathing. ***Clear to auscultation bilaterally.  ***Port site clean. Cardiovascular: ***Regular rate and rhythm, no murmurs. Abdomen: ***Soft, nontender.  Normoactive bowel sounds.  No masses or hepatosplenomegaly appreciated.  ***Well-healed scar. Extremities: ***Grossly normal range of motion.  Warm, well perfused.  No edema bilaterally. Skin: ***No rashes or lesions noted. Lymphatics: ***No cervical, supraclavicular, or inguinal adenopathy. GU: Normal appearing external genitalia  without erythema, excoriation, or lesions.  Speculum exam reveals ***.  Bimanual exam reveals ***.  ***Rectovaginal exam ***. Exam chaperoned by ***  ***picture Pink tinged fluid, erythema, edema drainage, exudate - rinsed  Laboratory & Radiologic Studies: ***

## 2022-06-10 NOTE — Telephone Encounter (Signed)
Called patient to let her know Dr Ernestina Patches would like her to come in to be seen today. Scheduled for 1:15pm today. Patient verbalized understanding and had no questions.

## 2022-06-10 NOTE — Patient Instructions (Signed)
It was a pleasure to see you in clinic today. - I sent a prescription for augmentin. Take twice daily for 10 days. - I have also sent oxycodone. Use up to every 4 hours as needed for severe pain. - Return visit planned for next week.  Thank you very much for allowing me to provide care for you today.  I appreciate your confidence in choosing our Gynecologic Oncology team at [facility].  If you have any questions about your visit today please call our office or send [place] a MyChart message and we will get back to you as soon as possible.

## 2022-06-13 ENCOUNTER — Encounter: Payer: Self-pay | Admitting: Psychiatry

## 2022-06-14 ENCOUNTER — Encounter: Payer: Self-pay | Admitting: Psychiatry

## 2022-06-17 ENCOUNTER — Inpatient Hospital Stay (HOSPITAL_BASED_OUTPATIENT_CLINIC_OR_DEPARTMENT_OTHER): Payer: 59 | Admitting: Psychiatry

## 2022-06-17 ENCOUNTER — Inpatient Hospital Stay: Payer: 59 | Admitting: Psychiatry

## 2022-06-17 ENCOUNTER — Ambulatory Visit: Payer: 59 | Admitting: Psychiatry

## 2022-06-17 VITALS — BP 122/70 | HR 85 | Temp 98.3°F | Resp 16 | Ht 67.72 in | Wt 189.0 lb

## 2022-06-17 DIAGNOSIS — D071 Carcinoma in situ of vulva: Secondary | ICD-10-CM | POA: Diagnosis not present

## 2022-06-17 DIAGNOSIS — T8141XA Infection following a procedure, superficial incisional surgical site, initial encounter: Secondary | ICD-10-CM | POA: Diagnosis not present

## 2022-06-17 DIAGNOSIS — Z9079 Acquired absence of other genital organ(s): Secondary | ICD-10-CM | POA: Diagnosis not present

## 2022-06-17 DIAGNOSIS — Z7189 Other specified counseling: Secondary | ICD-10-CM

## 2022-06-17 NOTE — Progress Notes (Signed)
Gynecologic Oncology Return Clinic Visit  Date of Service: 06/17/2022 Referring Provider: Nunzio Cobbs, MD   Assessment & Plan: Jennifer Velez is a 56 y.o. 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  Postop: - Pain improving with improved swelling and treatment of SSI. - Continue with pain meds PRN. - Primary resection site with evidence of breakdown with granulation tissue forming. - Recommended daily sitz baths. Sitz bath provided.  - Will plan for patient to return in a few weeks to ensure ongoing healing now that infection is resolving and granulation tissue forming.   Surgical site cellulitis: - Resolving. Appears well today on exam. - Complete last few tablets of antibiotics as prescribed.   VIN3: - Reviewed the nature of VIN3 - Pathology results reviewed in detail. No invasive cancer. Margins negative. - Recommend close surveillance with q90moexams x 570yr RTC approximately 4 week.  [name] Gynecologic Oncology   Medical Decision Making I personally spent  TOTAL 25 minutes face-to-face and non-face-to-face in the care of this patient, which includes all pre, intra, and post visit time on the date of service.   ----------------------- Reason for Visit: Wound check, treatment counseling  Treatment history: Patient noted on vulva at time of annual exam on 03/13/2022. 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  9 colposcopy on 04/04/2022 noted raised area on the right perineum and new biopsy obtained which showed VIN 3. Underwent simple partial vulvectomy with rhomboid rotational flap and CO2 laser ablation on 06/04/2022.  Final pathology showing VIN 3, negative margins.  Interval History: Today patient presents with her husband.  She reports some  improvement since her last exam.  She has noticed decreased discharge/drainage.  She is taking Motrin as needed for pain.  She had taken about 4 tablets total of the oxycodone but reports that this made her feel funny.  She had a bowel movement yesterday with some worsening of her pain when this occurred.  She is using senna on occasion to help with bowel movements but stopped MiraLAX due to stomach discomfort.  She is also resumed flaxseed to help with bowel movements.  She has taken the antibiotics as prescribed with about 3 days left.   Past Medical/Surgical History: Past Medical History:  Diagnosis Date   CIN III (cervical intraepithelial neoplasia III)    Complication of anesthesia    Endometriosis    Fibroid    GERD (gastroesophageal reflux disease)    IBS (irritable bowel syndrome)    Osteoporosis 05/2017   T score -2.0 prior DEXA 2016 T score -2.8 AP spine    Past Surgical History:  Procedure Laterality Date   CERVICAL BIOPSY  W/ LOOP ELECTRODE EXCISION  194496 CO2 LASER APPLICATION N/A 1275/03/1637 Procedure: CO2 LASER APPLICATION;  Surgeon: NeBernadene BellMD;  Location: [facility];  Service: Gynecology;  Laterality: N/A;   COLPOSCOPY     EXPLORATORY LAPAROTOMY  2004   myomectomy and excision endometriosis   PELVIC LAPAROSCOPY  1992   x2, for endometriosis   SKIN CANCER EXCISION     Squamous cell   VULVECTOMY N/A 06/04/2022   Procedure: WIDE EXCISION VULVECTOMY;  Surgeon: NeBernadene BellMD;  Location: [facility];  Service: Gynecology;  Laterality: N/A;    Family History  Problem Relation Age of Onset  Diverticulitis Mother    Alzheimer's disease Mother    Alzheimer's disease Maternal Grandmother    Alzheimer's disease Maternal Aunt    Alzheimer's disease Maternal Aunt    Ulcerative colitis Maternal Aunt    Ulcerative colitis Cousin    Colon cancer Neg Hx    Esophageal cancer Neg Hx    Rectal cancer Neg Hx    Stomach cancer Neg Hx    Colon polyps Neg Hx    Breast  cancer Neg Hx    Ovarian cancer Neg Hx    Endometrial cancer Neg Hx     Social History   Socioeconomic History   Marital status: Married    Spouse name: Not on file   Number of children: Not on file   Years of education: Not on file   Highest education level: Not on file  Occupational History   Occupation: Home maker  Tobacco Use   Smoking status: Never   Smokeless tobacco: Never  Vaping Use   Vaping Use: Never used  Substance and Sexual Activity   Alcohol use: No    Alcohol/week: 0.0 standard drinks of alcohol   Drug use: No   Sexual activity: Not Currently    Birth control/protection: Post-menopausal    Comment: 1st intercourse 56 yo-5 partners  Other Topics Concern   Not on file  Social History Narrative   3 adopted children, 2 boys and 1 girl   Social Determinants of Radio broadcast assistant Strain: Not on file  Food Insecurity: Not on file  Transportation Needs: Not on file  Physical Activity: Not on file  Stress: Not on file  Social Connections: Not on file    Current Medications:  Current Outpatient Medications:    Ascorbic Acid (VITAMIN C PO), Take 1,000 mg by mouth in the morning., Disp: , Rfl:    Cholecalciferol (VITAMIN D) 50 MCG (2000 UT) tablet, Take 2,000 Units by mouth in the morning., Disp: , Rfl:    fluorouracil (EFUDEX) 5 % cream, Apply 1 Application topically daily as needed (skin spots)., Disp: , Rfl:    ibuprofen (ADVIL) 200 MG tablet, Take 600 mg by mouth every 8 (eight) hours as needed (pain.)., Disp: , Rfl:    ibuprofen (ADVIL) 800 MG tablet, Take 1 tablet (800 mg total) by mouth every 8 (eight) hours as needed for mild pain. For AFTER surgery only, Disp: 30 tablet, Rfl: 0   oxyCODONE (OXY IR/ROXICODONE) 5 MG immediate release tablet, Take 1 tablet (5 mg total) by mouth every 4 (four) hours as needed for severe pain., Disp: 30 tablet, Rfl: 0   senna-docusate (SENOKOT-S) 8.6-50 MG tablet, Take 2 tablets by mouth at bedtime. For AFTER  surgery, do not take if having diarrhea, Disp: 30 tablet, Rfl: 0   traMADol (ULTRAM) 50 MG tablet, Take 1 tablet (50 mg total) by mouth every 6 (six) hours as needed. For AFTER surgery only, do not take and drive, Disp: 10 tablet, Rfl: 0  Review of Symptoms: Complete 10-system review is positive for: constipation, urinary frequency, chills, painful urination, vaginal discahrge  Physical Exam: BP 122/70 (BP Location: Right Arm, Patient Position: Sitting)   Pulse 85   Temp 98.3 F (36.8 C) (Oral)   Resp 16   Ht 5' 7.72" (1.72 m)   Wt 189 lb (85.7 kg)   LMP 11/02/2012   SpO2 96%   BMI 28.98 kg/m  General: Alert, oriented, appears more comfortable today HEENT: Normocephalic, atraumatic. Neck symmetric without masses.  Chest:  Normal work of breathing.  Extremities: Grossly normal range of motion.  Warm, well perfused.  No edema bilaterally. Skin: No rashes or lesions noted. GU: External genitalia with evidence of recent vulvectomy and rotational flap from the left posterior thigh/glute.  Significant improvement in erythema and swelling, essentially resolved. No further sersanguinous drainge. No purulent drainage. Vulvar resection site completely broken down with healthy pink granulation tissue in place. Left rotational flap incision site healing well. Small amount of fibrinous exudate easily rinsed off. Exam chaperoned by Kimberly Martinique, [place]     Laboratory & Radiologic Studies: FINAL MICROSCOPIC DIAGNOSIS:   A. VULVA, POSTERIOR, VULVECTOMY:  -  Vulvar intraepithelial neoplasia 3 of 3 (VIN 3) with adnexal  extension, negative for invasion.  -  Margins negative.

## 2022-06-17 NOTE — Patient Instructions (Signed)
It was a pleasure to see you in clinic today. - Sitz bath daily - Return visit planned for 3-4 weeks  Thank you very much for allowing me to provide care for you today.  I appreciate your confidence in choosing our Gynecologic Oncology team at [facility].  If you have any questions about your visit today please call our office or send [place] a MyChart message and we will get back to you as soon as possible.

## 2022-06-18 ENCOUNTER — Encounter: Payer: Self-pay | Admitting: Psychiatry

## 2022-06-21 ENCOUNTER — Encounter: Payer: Self-pay | Admitting: Psychiatry

## 2022-06-21 ENCOUNTER — Ambulatory Visit
Admission: RE | Admit: 2022-06-21 | Discharge: 2022-06-21 | Disposition: A | Discharge status: Home / Self Care | Payer: 59 | Source: Ambulatory Visit | Attending: Obstetrics and Gynecology | Admitting: Obstetrics and Gynecology

## 2022-06-21 DIAGNOSIS — Z1231 Encounter for screening mammogram for malignant neoplasm of breast: Secondary | ICD-10-CM

## 2022-06-21 NOTE — Progress Notes (Signed)
This encounter was created in error - please disregard.

## 2022-06-27 ENCOUNTER — Telehealth: Payer: Self-pay | Admitting: *Deleted

## 2022-06-27 NOTE — Telephone Encounter (Addendum)
Patient called and stated "I don't know if I have another infection or not. I am having burning and discharge still. I am wearing a pad and the discharge is on it. The color of the discharge is pink and yellow. There is an odor. It's a burning throbbing pain up in the vagina and near were I pee. I have not been using ice but have been doing the sitz bath. I have no bleeding. I have some pain with peeing. Still using the cream I was given, along with the peri bottle; no wiping. Not using the pillow, it hurst more. I'm not using the pain medicine just ibuprofen and it helps."  Explained that both Dr Gaspar Cola APP are in the OR today and the message would be given to them. Explained that the office would call her back toady or tomorrow. Patient unable to come in to the office this week, but can come next week. Pharmacy verified.

## 2022-07-03 ENCOUNTER — Other Ambulatory Visit: Payer: Self-pay

## 2022-07-03 ENCOUNTER — Inpatient Hospital Stay: Payer: 59 | Attending: Psychiatry | Admitting: Psychiatry

## 2022-07-03 ENCOUNTER — Encounter: Payer: Self-pay | Admitting: Psychiatry

## 2022-07-03 VITALS — BP 117/56 | HR 78 | Temp 98.4°F | Resp 16 | Wt 187.0 lb

## 2022-07-03 DIAGNOSIS — L0390 Cellulitis, unspecified: Secondary | ICD-10-CM

## 2022-07-03 DIAGNOSIS — D071 Carcinoma in situ of vulva: Secondary | ICD-10-CM

## 2022-07-03 DIAGNOSIS — T8141XD Infection following a procedure, superficial incisional surgical site, subsequent encounter: Secondary | ICD-10-CM | POA: Insufficient documentation

## 2022-07-03 DIAGNOSIS — Z9079 Acquired absence of other genital organ(s): Secondary | ICD-10-CM

## 2022-07-03 MED ORDER — AMOXICILLIN-POT CLAVULANATE 875-125 MG PO TABS: 1.0000 | ORAL_TABLET | Freq: Two times a day (BID) | ORAL | 0 refills | Status: AC

## 2022-07-03 NOTE — Progress Notes (Signed)
Gynecologic Oncology Return Clinic Visit  Date of Service: 07/03/2022 Referring Provider: Nunzio Cobbs, MD   Assessment & Plan: Jennifer Velez is a 57 y.o. 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 - Recommend continue daily sitz bath.  Patient may trial with Epsom salt. -However, left lateral incision is now indurated and erythematous.  Concern for repeat or inadequately treated infection.  New prescription sent for Augmentin, this time for 14 days. - Reviewed with patient that if infection does not resolve with this intervention, would consider next steps to include ultrasound of the site. - Once improvement in pain with starting antibiotics, recommended consideration of utilizing massage over the scar.  VIN3: - Recommend close surveillance with q13moexams x 573yr RTC approximately 2 week.  [name] Gynecologic Oncology   Medical Decision Making I personally spent  TOTAL 25 minutes face-to-face and non-face-to-face in the care of this patient, which includes all pre, intra, and post visit time on the date of service.   ----------------------- Reason for Visit: Wound check, treatment counseling  Treatment history: Patient noted on vulva at time of annual exam on 03/13/2022. 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  9 colposcopy on 04/04/2022 noted raised area on the right perineum and new biopsy obtained which showed VIN 3. Underwent simple partial vulvectomy with rhomboid rotational flap and CO2 laser ablation on 06/04/2022.  Final pathology showing VIN 3, negative margins.  Interval History: Today, patient presents with her husband.  She reports that over the past week she has noticed  some increased odorous discharge as well as increased swelling and pain fullness over the left lateral incision.  She also reports some burning before and during urination.  She is taking Motrin primarily for pain and does not desire to take any of the tramadol or oxycodone.   Past Medical/Surgical History: Past Medical History:  Diagnosis Date   CIN III (cervical intraepithelial neoplasia III)    Complication of anesthesia    Endometriosis    Fibroid    GERD (gastroesophageal reflux disease)    IBS (irritable bowel syndrome)    Osteoporosis 05/2017   T score -2.0 prior DEXA 2016 T score -2.8 AP spine    Past Surgical History:  Procedure Laterality Date   CERVICAL BIOPSY  W/ LOOP ELECTRODE EXCISION  194268 CO2 LASER APPLICATION N/A 1234/07/9620 Procedure: CO2 LASER APPLICATION;  Surgeon: NeBernadene BellMD;  Location: [facility];  Service: Gynecology;  Laterality: N/A;   COLPOSCOPY     EXPLORATORY LAPAROTOMY  2004   myomectomy and excision endometriosis   PELVIC LAPAROSCOPY  1992   x2, for endometriosis   SKIN CANCER EXCISION     Squamous cell   VULVECTOMY N/A 06/04/2022   Procedure: WIDE EXCISION VULVECTOMY;  Surgeon: NeBernadene BellMD;  Location: [facility];  Service: Gynecology;  Laterality: N/A;    Family History  Problem Relation Age of Onset   Diverticulitis Mother    Alzheimer's disease Mother    Alzheimer's disease Maternal Grandmother    Alzheimer's disease Maternal Aunt    Alzheimer's disease Maternal Aunt    Ulcerative colitis Maternal Aunt    Ulcerative colitis Cousin    Colon cancer Neg Hx    Esophageal  cancer Neg Hx    Rectal cancer Neg Hx    Stomach cancer Neg Hx    Colon polyps Neg Hx    Breast cancer Neg Hx    Ovarian cancer Neg Hx    Endometrial cancer Neg Hx     Social History   Socioeconomic History   Marital status: Married    Spouse name: Not on file   Number of children: Not on file   Years of education: Not on file   Highest education  level: Not on file  Occupational History   Occupation: Home maker  Tobacco Use   Smoking status: Never   Smokeless tobacco: Never  Vaping Use   Vaping Use: Never used  Substance and Sexual Activity   Alcohol use: No    Alcohol/week: 0.0 standard drinks of alcohol   Drug use: No   Sexual activity: Not Currently    Birth control/protection: Post-menopausal    Comment: 1st intercourse 77 yo-5 partners  Other Topics Concern   Not on file  Social History Narrative   3 adopted children, 2 boys and 1 girl   Social Determinants of Radio broadcast assistant Strain: Not on file  Food Insecurity: Not on file  Transportation Needs: Not on file  Physical Activity: Not on file  Stress: Not on file  Social Connections: Not on file    Current Medications:  Current Outpatient Medications:    amoxicillin-clavulanate (AUGMENTIN) 875-125 MG tablet, Take 1 tablet by mouth 2 (two) times daily for 14 days., Disp: 28 tablet, Rfl: 0   Ascorbic Acid (VITAMIN C PO), Take 1,000 mg by mouth in the morning., Disp: , Rfl:    Cholecalciferol (VITAMIN D) 50 MCG (2000 UT) tablet, Take 2,000 Units by mouth in the morning., Disp: , Rfl:    fluorouracil (EFUDEX) 5 % cream, Apply 1 Application topically daily as needed (skin spots)., Disp: , Rfl:    ibuprofen (ADVIL) 200 MG tablet, Take 600 mg by mouth every 8 (eight) hours as needed (pain.)., Disp: , Rfl:    ibuprofen (ADVIL) 800 MG tablet, Take 1 tablet (800 mg total) by mouth every 8 (eight) hours as needed for mild pain. For AFTER surgery only, Disp: 30 tablet, Rfl: 0   oxyCODONE (OXY IR/ROXICODONE) 5 MG immediate release tablet, Take 1 tablet (5 mg total) by mouth every 4 (four) hours as needed for severe pain., Disp: 30 tablet, Rfl: 0   senna-docusate (SENOKOT-S) 8.6-50 MG tablet, Take 2 tablets by mouth at bedtime. For AFTER surgery, do not take if having diarrhea, Disp: 30 tablet, Rfl: 0   traMADol (ULTRAM) 50 MG tablet, Take 1 tablet (50 mg total) by  mouth every 6 (six) hours as needed. For AFTER surgery only, do not take and drive, Disp: 10 tablet, Rfl: 0  Review of Symptoms: Complete 10-system review is positive for: Urinary frequency, painful urination, vaginal discharge Physical Exam: BP (!) 117/56 (BP Location: Left Arm, Patient Position: Sitting)   Pulse 78   Temp 98.4 F (36.9 C) (Oral)   Resp 16   Wt 187 lb (84.8 kg)   LMP 11/02/2012   SpO2 99%   BMI 28.67 kg/m  General: Alert, oriented, appears more somewhat uncomfortable  HEENT: Normocephalic, atraumatic. Neck symmetric without masses.  Chest: Normal work of breathing.  Extremities: Grossly normal range of motion.  Warm, well perfused.  No edema bilaterally. Skin: No rashes or lesions noted. GU: External genitalia with evidence of recent vulvectomy and rotational flap from  the left posterior thigh/glute.  Area over perineal body and now with healthy appearing granulation tissue and appears more level with the surrounding tissue.  This portion appears to be healing well.  However increased erythema, induration, and tenderness overlying the left rotational flap incision site. Some build up of exudative tissue in the creases between her anterior labia minor and majora. Exam chaperoned by Joylene John, NP   Laboratory & Radiologic Studies: FINAL MICROSCOPIC DIAGNOSIS:   A. VULVA, POSTERIOR, VULVECTOMY:  -  Vulvar intraepithelial neoplasia 3 of 3 (VIN 3) with adnexal  extension, negative for invasion.  -  Margins negative.

## 2022-07-03 NOTE — Patient Instructions (Signed)
It was a pleasure to see you in clinic today. - Take the antibiotics for 2 weeks - Continue with sitz baths, add Epson salt - consider some massage to the scar area over the glute to help - Return visit planned for 2 weeks  Thank you very much for allowing me to provide care for you today.  I appreciate your confidence in choosing our Gynecologic Oncology team at [facility].  If you have any questions about your visit today please call our office or send [place] a MyChart message and we will get back to you as soon as possible.

## 2022-07-15 ENCOUNTER — Other Ambulatory Visit: Payer: Self-pay

## 2022-07-15 ENCOUNTER — Inpatient Hospital Stay (HOSPITAL_BASED_OUTPATIENT_CLINIC_OR_DEPARTMENT_OTHER): Payer: 59 | Admitting: Psychiatry

## 2022-07-15 VITALS — BP 112/58 | HR 82 | Temp 98.3°F | Resp 14 | Wt 191.4 lb

## 2022-07-15 DIAGNOSIS — D071 Carcinoma in situ of vulva: Secondary | ICD-10-CM

## 2022-07-15 DIAGNOSIS — L0390 Cellulitis, unspecified: Secondary | ICD-10-CM

## 2022-07-15 DIAGNOSIS — Z9079 Acquired absence of other genital organ(s): Secondary | ICD-10-CM

## 2022-07-15 NOTE — Progress Notes (Signed)
Gynecologic Oncology Return Clinic Visit  Date of Service: 07/15/2022 Referring Provider: Nunzio Cobbs, MD   Assessment & Plan: Jennifer Velez is a 57 y.o. 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 Now improving after additional antibiotics and stiz baths.   Postop with surgical site infection: -Excellent granulation tissue in the primary resection site that appears to be healing well. - Recommend continue daily sitz bath with epsom salts - Left lateral incision also improving. Pt to finish abx as prescribed. - Given ongoing induration (although resolution of erythema), will order soft tissue ultrasound to ensure that there is no fluid collection or abscess that could require drainage.  - Pt to continue massage with vibration as desired.  VIN3: - Recommend close surveillance with q67moexams x 541yr RTC approximately 1 month.  [name] Gynecologic Oncology   Medical Decision Making I personally spent  TOTAL 25 minutes face-to-face and non-face-to-face in the care of this patient, which includes all pre, intra, and post visit time on the date of service.   ----------------------- Reason for Visit: Wound check, treatment counseling  Treatment history: Patient noted on vulva at time of annual exam on 03/13/2022. 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  9 colposcopy on 04/04/2022 noted raised area on the right perineum and new biopsy obtained which showed VIN 3. Underwent simple partial vulvectomy with rhomboid rotational flap and CO2 laser ablation on 06/04/2022.  Final pathology showing VIN 3, negative margins.  Interval History: Presents today with her husband. Reports that she is doing better She continues with stiz baths. Her pain is improving and she repots she can sit and walk more. She reports that she finishes  her antibiotic course on Wednesday.  Past Medical/Surgical History: Past Medical History:  Diagnosis Date   CIN III (cervical intraepithelial neoplasia III)    Complication of anesthesia    Endometriosis    Fibroid    GERD (gastroesophageal reflux disease)    IBS (irritable bowel syndrome)    Osteoporosis 05/2017   T score -2.0 prior DEXA 2016 T score -2.8 AP spine    Past Surgical History:  Procedure Laterality Date   CERVICAL BIOPSY  W/ LOOP ELECTRODE EXCISION  194970 CO2 LASER APPLICATION N/A 1226/08/7856 Procedure: CO2 LASER APPLICATION;  Surgeon: NeBernadene BellMD;  Location: [facility];  Service: Gynecology;  Laterality: N/A;   COLPOSCOPY     EXPLORATORY LAPAROTOMY  2004   myomectomy and excision endometriosis   PELVIC LAPAROSCOPY  1992   x2, for endometriosis   SKIN CANCER EXCISION     Squamous cell   VULVECTOMY N/A 06/04/2022   Procedure: WIDE EXCISION VULVECTOMY;  Surgeon: NeBernadene BellMD;  Location: [facility];  Service: Gynecology;  Laterality: N/A;    Family History  Problem Relation Age of Onset   Diverticulitis Mother    Alzheimer's disease Mother    Alzheimer's disease Maternal Grandmother    Alzheimer's disease Maternal Aunt    Alzheimer's disease Maternal Aunt    Ulcerative colitis Maternal Aunt    Ulcerative colitis Cousin    Colon cancer Neg Hx    Esophageal cancer Neg Hx    Rectal cancer Neg Hx    Stomach cancer Neg Hx    Colon polyps Neg Hx    Breast cancer Neg Hx    Ovarian cancer Neg Hx  Endometrial cancer Neg Hx     Social History   Socioeconomic History   Marital status: Married    Spouse name: Not on file   Number of children: Not on file   Years of education: Not on file   Highest education level: Not on file  Occupational History   Occupation: Home maker  Tobacco Use   Smoking status: Never   Smokeless tobacco: Never  Vaping Use   Vaping Use: Never used  Substance and Sexual Activity   Alcohol use: No    Alcohol/week: 0.0  standard drinks of alcohol   Drug use: No   Sexual activity: Not Currently    Birth control/protection: Post-menopausal    Comment: 1st intercourse 35 yo-5 partners  Other Topics Concern   Not on file  Social History Narrative   3 adopted children, 2 boys and 1 girl   Social Determinants of Radio broadcast assistant Strain: Not on file  Food Insecurity: Not on file  Transportation Needs: Not on file  Physical Activity: Not on file  Stress: Not on file  Social Connections: Not on file    Current Medications:  Current Outpatient Medications:    Ascorbic Acid (VITAMIN C PO), Take 1,000 mg by mouth in the morning., Disp: , Rfl:    Cholecalciferol (VITAMIN D) 50 MCG (2000 UT) tablet, Take 2,000 Units by mouth in the morning., Disp: , Rfl:    fluorouracil (EFUDEX) 5 % cream, Apply 1 Application topically daily as needed (skin spots)., Disp: , Rfl:    ibuprofen (ADVIL) 200 MG tablet, Take 600 mg by mouth every 8 (eight) hours as needed (pain.)., Disp: , Rfl:    senna-docusate (SENOKOT-S) 8.6-50 MG tablet, Take 2 tablets by mouth at bedtime. For AFTER surgery, do not take if having diarrhea, Disp: 30 tablet, Rfl: 0  Review of Symptoms: Complete 10-system review is positive for: Urinary frequency, painful urination, vaginal discharge Physical Exam: BP (!) 112/58 (BP Location: Left Arm, Patient Position: Sitting)   Pulse 82   Temp 98.3 F (36.8 C) (Oral)   Resp 14   Wt 191 lb 6.4 oz (86.8 kg)   LMP 11/02/2012   SpO2 98%   BMI 29.35 kg/m  General: Alert, oriented, appears more somewhat uncomfortable  HEENT: Normocephalic, atraumatic. Neck symmetric without masses.  Chest: Normal work of breathing.  Extremities: Grossly normal range of motion.  Warm, well perfused.  No edema bilaterally. Skin: No rashes or lesions noted. GU: External genitalia with evidence of recent vulvectomy and rotational flap from the left posterior thigh/glute.  Area over perineal body and with healthy  appearing granulation tissue and appears more level with the surrounding tissue.  This portion appears to be healing well. Area with rotational flap no longer with erythema or drainage. Still with some induration and mild TTP.  Exam chaperoned     Laboratory & Radiologic Studies: FINAL MICROSCOPIC DIAGNOSIS:   A. VULVA, POSTERIOR, VULVECTOMY:  -  Vulvar intraepithelial neoplasia 3 of 3 (VIN 3) with adnexal  extension, negative for invasion.  -  Margins negative.

## 2022-07-15 NOTE — Patient Instructions (Signed)
It was a pleasure to see you in clinic today. - Complete your antibiotics. - Keep up with the stiz baths - We will get an ultrasound of the gluteal scar to make sure no fluid collection. - Return visit planned for 1 month  Thank you very much for allowing me to provide care for you today.  I appreciate your confidence in choosing our Gynecologic Oncology team at [facility].  If you have any questions about your visit today please call our office or send [place] a MyChart message and we will get back to you as soon as possible.

## 2022-07-18 ENCOUNTER — Ambulatory Visit (HOSPITAL_COMMUNITY): Payer: 59

## 2022-07-21 ENCOUNTER — Encounter: Payer: Self-pay | Admitting: Psychiatry

## 2022-07-22 ENCOUNTER — Ambulatory Visit: Payer: 59 | Admitting: Psychiatry

## 2022-07-25 ENCOUNTER — Telehealth: Payer: Self-pay | Admitting: Surgery

## 2022-07-25 ENCOUNTER — Ambulatory Visit (HOSPITAL_COMMUNITY)
Admission: RE | Admit: 2022-07-25 | Discharge: 2022-07-25 | Disposition: A | Discharge status: Home / Self Care | Payer: 59 | Source: Ambulatory Visit | Attending: Psychiatry | Admitting: Psychiatry

## 2022-07-25 ENCOUNTER — Inpatient Hospital Stay: Payer: 59

## 2022-07-25 ENCOUNTER — Inpatient Hospital Stay (HOSPITAL_BASED_OUTPATIENT_CLINIC_OR_DEPARTMENT_OTHER): Payer: 59 | Admitting: Gynecologic Oncology

## 2022-07-25 ENCOUNTER — Telehealth: Payer: Self-pay | Admitting: *Deleted

## 2022-07-25 VITALS — BP 113/72 | HR 79 | Temp 98.6°F | Resp 16

## 2022-07-25 DIAGNOSIS — L0390 Cellulitis, unspecified: Secondary | ICD-10-CM | POA: Insufficient documentation

## 2022-07-25 DIAGNOSIS — D071 Carcinoma in situ of vulva: Secondary | ICD-10-CM | POA: Diagnosis present

## 2022-07-25 DIAGNOSIS — T8141XD Infection following a procedure, superficial incisional surgical site, subsequent encounter: Secondary | ICD-10-CM | POA: Diagnosis not present

## 2022-07-25 DIAGNOSIS — Z9079 Acquired absence of other genital organ(s): Secondary | ICD-10-CM | POA: Diagnosis not present

## 2022-07-25 DIAGNOSIS — R102 Pelvic and perineal pain: Secondary | ICD-10-CM

## 2022-07-25 LAB — URINALYSIS, COMPLETE (UACMP) WITH MICROSCOPIC
Bacteria, UA: NONE SEEN
Bilirubin Urine: NEGATIVE
Glucose, UA: NEGATIVE mg/dL
Hgb urine dipstick: NEGATIVE
Ketones, ur: NEGATIVE mg/dL
Nitrite: NEGATIVE
Protein, ur: NEGATIVE mg/dL
Specific Gravity, Urine: 1.02 (ref 1.005–1.030)
pH: 5 (ref 5.0–8.0)

## 2022-07-25 NOTE — Telephone Encounter (Signed)
Patient called in stating she is having 7/10 pain. She finished her course of abx after her previous office visit and states that a few days after she finished them she started noticed a foul odor. She is experiencing burning with urination and burning at surgical site. It hurts to sit and she is having some spotting still. Increased discharge from office visit. Patient states she has an ultrasound scheduled today and is planning to go to it still but is unsure how she's going to manage due to pain. Advised that our office will call her back with recommendations from [facility], [facility].

## 2022-07-25 NOTE — Progress Notes (Signed)
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 She was last seen on 07/15/2022 and states she was doing better at that visit.   Patient states going into last weekend, she developed more pain in that area with difficulty sitting and changing positions.  She completed the oral antibiotic regimen and reported noticing a foul odor several days after. She reports yellowish drainage.  Nothing seems to make the discomfort better.  No fever or chills.  Tolerating diet with no nausea or emesis.  Reports burning on the vulva with urination and movement.  States bowels are moving. Had to strain slightly to have a BM prior to coming to appointment.  No significant vaginal bleeding reported.  She continues to use her peribottle.    Exam: Alert, oriented, in no acute distress but appears uncomfortable. Lungs clear. Heart regular in rate and rhythm. External genitalia with evidence of recent vulvectomy and rotational flap from the left posterior thigh/glute.  Area over perineal body and with healthy appearing granulation tissue. No surrounding erythema or drainage. Appears to be healing well. Area with rotational flap with no erythema or drainage. Area indurated and tender to touch, more compared with picture from exam on 1/15. Exam chaperoned.     07/25/2022   10:23 AM 07/15/2022    3:18 PM 07/03/2022    8:36 AM  Vitals with BMI  Weight  191 lbs 6 oz 187 lbs  BMI  93.81 01.75  Systolic 102 585 277  Diastolic 72 58 56  Pulse 79 82 78   Temperature 98.6.  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 She presents to the office  today for evaluation of increased pain and drainage. Given topical lidocaine to use topically as needed. Increased fluctuance in the area with the rotational flap when comparing exam today to photo. Patient is to go for soft tissue ultrasound after this visit to further evaluate the area of fluctuance. She declines a prescription for pain medication at this time. She does not appear acutely ill at this time and has remained afebrile. Given burning with urination from the bladder, plan to check a urine sample and contact her with the results. Low suspicion for UTI. Discussed peri care. She will be contacted with the results of the ultrasound from today. She is advised to call the office for any needs or new symptoms.

## 2022-07-25 NOTE — Telephone Encounter (Signed)
Called patient to advise her to come in for eval with [facility]. Patient verbalized understanding. Scheduled while on the phone.

## 2022-07-25 NOTE — Telephone Encounter (Signed)
Call to patient with results of urinalysis per Joylene John, NP.  Advised no obvious signs of infection with urine. Ultrasound results pending.  Patient states she feels a little better since getting home and taking Motrin. Will be using cream from today shortly. Advised will call back once ultrasound results reviewed.

## 2022-07-25 NOTE — Patient Instructions (Signed)
You can use the topical lidocaine to the area that is uncomfortable on the vulva. You can also place a moistened gauze in the area if this helps.  We will contact you with the results of your urine sample.  We will contact you with the results of your ultrasound from today.   Continue use of frozen peas.

## 2022-07-26 LAB — URINE CULTURE: Culture: <10000 — AB

## 2022-07-28 ENCOUNTER — Encounter: Payer: Self-pay | Admitting: Gynecologic Oncology

## 2022-07-29 ENCOUNTER — Telehealth: Payer: Self-pay | Admitting: Surgery

## 2022-07-29 ENCOUNTER — Telehealth: Payer: Self-pay | Admitting: Psychiatry

## 2022-07-29 ENCOUNTER — Other Ambulatory Visit (HOSPITAL_COMMUNITY): Payer: Self-pay

## 2022-07-29 NOTE — Telephone Encounter (Signed)
Called pt regarding ultrasound results. Discussed exam under anesthesia, incision and drainage, packing of wound and any other indicated procedures. Reviewed risks/benefits. Reviewed that we may be able to see if we can get it added on urgently tomorrow given her continued worsening pain. She will not have anything to eat or drink after midnight. We will call first thing in the morning and update her if we are able to get it added on.

## 2022-07-29 NOTE — Telephone Encounter (Signed)
Patient called in to follow up on ultrasound results. Patient advised results are still pending and that our office will call her once we receive results. Patient states she is still having some bleeding coming from incision site, she is changing her pad 2-3x per day. States she had what looked like a piece of skin, about 2 in long with some blood on it in the toilet this morning. Reports 6/10 pain. Patient advised that her husband can come by and pick up additional lidocaine gel today. Advised her to continue monitoring symptoms and to call our office if bleeding soaks a pad every hour or other concerning symptoms.

## 2022-07-30 ENCOUNTER — Other Ambulatory Visit: Payer: Self-pay

## 2022-07-30 ENCOUNTER — Ambulatory Visit (HOSPITAL_BASED_OUTPATIENT_CLINIC_OR_DEPARTMENT_OTHER): Payer: 59 | Admitting: Anesthesiology

## 2022-07-30 ENCOUNTER — Other Ambulatory Visit: Payer: Self-pay | Admitting: Gynecologic Oncology

## 2022-07-30 ENCOUNTER — Ambulatory Visit (HOSPITAL_BASED_OUTPATIENT_CLINIC_OR_DEPARTMENT_OTHER)
Admission: RE | Admit: 2022-07-30 | Discharge: 2022-07-31 | Disposition: A | Discharge status: Home / Self Care | Payer: 59 | Attending: Psychiatry | Admitting: Psychiatry

## 2022-07-30 ENCOUNTER — Encounter (HOSPITAL_BASED_OUTPATIENT_CLINIC_OR_DEPARTMENT_OTHER)
Admission: RE | Disposition: A | Discharge status: Home / Self Care | Payer: Self-pay | Source: Home / Self Care | Attending: Psychiatry

## 2022-07-30 ENCOUNTER — Encounter (HOSPITAL_BASED_OUTPATIENT_CLINIC_OR_DEPARTMENT_OTHER): Payer: Self-pay | Admitting: Psychiatry

## 2022-07-30 DIAGNOSIS — N9989 Other postprocedural complications and disorders of genitourinary system: Secondary | ICD-10-CM | POA: Diagnosis not present

## 2022-07-30 DIAGNOSIS — N99840 Postprocedural hematoma of a genitourinary system organ or structure following a genitourinary system procedure: Secondary | ICD-10-CM | POA: Insufficient documentation

## 2022-07-30 DIAGNOSIS — S31809A Unspecified open wound of unspecified buttock, initial encounter: Secondary | ICD-10-CM | POA: Diagnosis present

## 2022-07-30 DIAGNOSIS — N9089 Other specified noninflammatory disorders of vulva and perineum: Secondary | ICD-10-CM

## 2022-07-30 DIAGNOSIS — Y838 Other surgical procedures as the cause of abnormal reaction of the patient, or of later complication, without mention of misadventure at the time of the procedure: Secondary | ICD-10-CM | POA: Diagnosis not present

## 2022-07-30 HISTORY — PX: INCISION AND DRAINAGE ABSCESS: SHX5864

## 2022-07-30 LAB — CBC
HCT: 40.9 % (ref 36.0–46.0)
Hemoglobin: 14.4 g/dL (ref 12.0–15.0)
MCH: 34 pg (ref 26.0–34.0)
MCHC: 35.2 g/dL (ref 30.0–36.0)
MCV: 96.5 fL (ref 80.0–100.0)
Platelets: 234 10*3/uL (ref 150–400)
RBC: 4.24 MIL/uL (ref 3.87–5.11)
RDW: 11.9 % (ref 11.5–15.5)
WBC: 6.1 10*3/uL (ref 4.0–10.5)
nRBC: 0 % (ref 0.0–0.2)

## 2022-07-30 LAB — BASIC METABOLIC PANEL
Anion gap: 9 (ref 5–15)
BUN: 19 mg/dL (ref 6–20)
CO2: 27 mmol/L (ref 22–32)
Calcium: 8.8 mg/dL — ABNORMAL LOW (ref 8.9–10.3)
Chloride: 103 mmol/L (ref 98–111)
Creatinine, Ser: 0.73 mg/dL (ref 0.44–1.00)
GFR, Estimated: >60 mL/min (ref >60)
Glucose, Bld: 100 mg/dL — ABNORMAL HIGH (ref 70–99)
Potassium: 3.7 mmol/L (ref 3.5–5.1)
Sodium: 139 mmol/L (ref 135–145)

## 2022-07-30 SURGERY — INCISION AND DRAINAGE, ABSCESS
Anesthesia: General

## 2022-07-30 MED ORDER — SIMETHICONE 80 MG PO CHEW: 80.0000 mg | CHEWABLE_TABLET | Freq: Four times a day (QID) | ORAL | Status: DC | PRN

## 2022-07-30 MED ORDER — IBUPROFEN 200 MG PO TABS
600.0000 mg | ORAL_TABLET | Freq: Four times a day (QID) | ORAL | Status: DC | PRN
  Administered 2022-07-30 – 2022-07-31 (×2): 600 mg via ORAL

## 2022-07-30 MED ORDER — CEFAZOLIN SODIUM-DEXTROSE 2-4 GM/100ML-% IV SOLN
INTRAVENOUS | Status: AC
  Filled 2022-07-30: qty 100

## 2022-07-30 MED ORDER — MIDAZOLAM HCL 2 MG/2ML IJ SOLN
INTRAMUSCULAR | Status: AC
  Filled 2022-07-30: qty 2

## 2022-07-30 MED ORDER — TRAMADOL HCL 50 MG PO TABS
ORAL_TABLET | ORAL | Status: AC
  Filled 2022-07-30: qty 1

## 2022-07-30 MED ORDER — KETOROLAC TROMETHAMINE 15 MG/ML IJ SOLN: 15.0000 mg | INTRAMUSCULAR | Status: DC

## 2022-07-30 MED ORDER — DEXAMETHASONE SODIUM PHOSPHATE 4 MG/ML IJ SOLN
INTRAMUSCULAR | Status: DC | PRN
  Administered 2022-07-30: 5 mg via INTRAVENOUS

## 2022-07-30 MED ORDER — AMISULPRIDE (ANTIEMETIC) 5 MG/2ML IV SOLN: 10.0000 mg | Freq: Once | INTRAVENOUS | Status: DC | PRN

## 2022-07-30 MED ORDER — ACETAMINOPHEN 500 MG PO TABS: 1000.0000 mg | ORAL_TABLET | ORAL | Status: DC

## 2022-07-30 MED ORDER — PROPOFOL 500 MG/50ML IV EMUL
INTRAVENOUS | Status: DC | PRN
  Administered 2022-07-30: 150 ug/kg/min via INTRAVENOUS

## 2022-07-30 MED ORDER — ONDANSETRON HCL 4 MG/2ML IJ SOLN: 4.0000 mg | Freq: Once | INTRAMUSCULAR | Status: DC | PRN

## 2022-07-30 MED ORDER — LIDOCAINE HCL (PF) 1 % IJ SOLN
INTRAMUSCULAR | Status: DC | PRN
  Administered 2022-07-30: 10 mL

## 2022-07-30 MED ORDER — HYDROMORPHONE HCL 1 MG/ML IJ SOLN: 0.2500 mg | INTRAMUSCULAR | Status: DC | PRN

## 2022-07-30 MED ORDER — PROPOFOL 10 MG/ML IV BOLUS
INTRAVENOUS | Status: AC
  Filled 2022-07-30: qty 20

## 2022-07-30 MED ORDER — SCOPOLAMINE 1 MG/3DAYS TD PT72
MEDICATED_PATCH | TRANSDERMAL | Status: AC
  Filled 2022-07-30: qty 1

## 2022-07-30 MED ORDER — ONDANSETRON HCL 4 MG/2ML IJ SOLN: 4.0000 mg | Freq: Four times a day (QID) | INTRAMUSCULAR | Status: DC | PRN

## 2022-07-30 MED ORDER — OXYCODONE HCL 5 MG PO TABS: 5.0000 mg | ORAL_TABLET | Freq: Once | ORAL | Status: DC | PRN

## 2022-07-30 MED ORDER — LIDOCAINE HCL (CARDIAC) PF 100 MG/5ML IV SOSY
PREFILLED_SYRINGE | INTRAVENOUS | Status: DC | PRN
  Administered 2022-07-30: 60 mg via INTRAVENOUS

## 2022-07-30 MED ORDER — ACETAMINOPHEN 500 MG PO TABS
1000.0000 mg | ORAL_TABLET | Freq: Once | ORAL | Status: AC
  Administered 2022-07-30: 1000 mg via ORAL

## 2022-07-30 MED ORDER — SENNOSIDES-DOCUSATE SODIUM 8.6-50 MG PO TABS
2.0000 | ORAL_TABLET | Freq: Every day | ORAL | Status: DC
  Administered 2022-07-31: 2 via ORAL
  Filled 2022-07-30 (×2): qty 2

## 2022-07-30 MED ORDER — FENTANYL CITRATE (PF) 100 MCG/2ML IJ SOLN
INTRAMUSCULAR | Status: DC | PRN
  Administered 2022-07-30: 50 ug via INTRAVENOUS
  Administered 2022-07-30 (×4): 25 ug via INTRAVENOUS

## 2022-07-30 MED ORDER — TRAMADOL HCL 50 MG PO TABS
50.0000 mg | ORAL_TABLET | Freq: Four times a day (QID) | ORAL | Status: DC | PRN
  Administered 2022-07-30 – 2022-07-31 (×4): 50 mg via ORAL

## 2022-07-30 MED ORDER — DEXAMETHASONE SODIUM PHOSPHATE 10 MG/ML IJ SOLN
INTRAMUSCULAR | Status: AC
  Filled 2022-07-30: qty 1

## 2022-07-30 MED ORDER — KETOROLAC TROMETHAMINE 30 MG/ML IJ SOLN: 30.0000 mg | Freq: Once | INTRAMUSCULAR | Status: DC | PRN

## 2022-07-30 MED ORDER — LACTATED RINGERS IV SOLN: INTRAVENOUS | Status: DC

## 2022-07-30 MED ORDER — KETOROLAC TROMETHAMINE 30 MG/ML IJ SOLN
INTRAMUSCULAR | Status: DC | PRN
  Administered 2022-07-30: 30 mg via INTRAVENOUS

## 2022-07-30 MED ORDER — PHENYLEPHRINE HCL (PRESSORS) 10 MG/ML IV SOLN
INTRAVENOUS | Status: DC | PRN
  Administered 2022-07-30 (×4): 80 ug via INTRAVENOUS

## 2022-07-30 MED ORDER — ALUM & MAG HYDROXIDE-SIMETH 200-200-20 MG/5ML PO SUSP: 30.0000 mL | ORAL | Status: DC | PRN

## 2022-07-30 MED ORDER — FENTANYL CITRATE (PF) 100 MCG/2ML IJ SOLN
INTRAMUSCULAR | Status: AC
  Filled 2022-07-30: qty 2

## 2022-07-30 MED ORDER — IBUPROFEN 200 MG PO TABS
ORAL_TABLET | ORAL | Status: AC
  Filled 2022-07-30: qty 3

## 2022-07-30 MED ORDER — ACETAMINOPHEN 500 MG PO TABS
ORAL_TABLET | ORAL | Status: AC
  Filled 2022-07-30: qty 2

## 2022-07-30 MED ORDER — ONDANSETRON HCL 4 MG PO TABS: 4.0000 mg | ORAL_TABLET | Freq: Four times a day (QID) | ORAL | Status: DC | PRN

## 2022-07-30 MED ORDER — MIDAZOLAM HCL 5 MG/5ML IJ SOLN
INTRAMUSCULAR | Status: DC | PRN
  Administered 2022-07-30: 2 mg via INTRAVENOUS

## 2022-07-30 MED ORDER — PROPOFOL 500 MG/50ML IV EMUL
INTRAVENOUS | Status: AC
  Filled 2022-07-30: qty 100

## 2022-07-30 MED ORDER — KETOROLAC TROMETHAMINE 30 MG/ML IJ SOLN
INTRAMUSCULAR | Status: AC
  Filled 2022-07-30: qty 1

## 2022-07-30 MED ORDER — LIDOCAINE HCL (PF) 2 % IJ SOLN
INTRAMUSCULAR | Status: AC
  Filled 2022-07-30: qty 5

## 2022-07-30 MED ORDER — CEFAZOLIN SODIUM-DEXTROSE 2-4 GM/100ML-% IV SOLN
2.0000 g | INTRAVENOUS | Status: AC
  Administered 2022-07-30: 2 g via INTRAVENOUS

## 2022-07-30 MED ORDER — ONDANSETRON HCL 4 MG/2ML IJ SOLN
INTRAMUSCULAR | Status: DC | PRN
  Administered 2022-07-30: 4 mg via INTRAVENOUS

## 2022-07-30 MED ORDER — DEXAMETHASONE SODIUM PHOSPHATE 10 MG/ML IJ SOLN: 4.0000 mg | INTRAMUSCULAR | Status: DC

## 2022-07-30 MED ORDER — GABAPENTIN 300 MG PO CAPS
ORAL_CAPSULE | ORAL | Status: AC
  Filled 2022-07-30: qty 1

## 2022-07-30 MED ORDER — SCOPOLAMINE 1 MG/3DAYS TD PT72
1.0000 | MEDICATED_PATCH | TRANSDERMAL | Status: DC
  Administered 2022-07-30: 1.5 mg via TRANSDERMAL

## 2022-07-30 MED ORDER — SILVER NITRATE-POT NITRATE 75-25 % EX MISC
CUTANEOUS | Status: DC | PRN
  Administered 2022-07-30: 6

## 2022-07-30 MED ORDER — TRAMADOL HCL 50 MG PO TABS: 50.0000 mg | ORAL_TABLET | Freq: Four times a day (QID) | ORAL | 0 refills | Status: DC | PRN

## 2022-07-30 MED ORDER — PHENYLEPHRINE 80 MCG/ML (10ML) SYRINGE FOR IV PUSH (FOR BLOOD PRESSURE SUPPORT)
PREFILLED_SYRINGE | INTRAVENOUS | Status: AC
  Filled 2022-07-30: qty 10

## 2022-07-30 MED ORDER — GABAPENTIN 300 MG PO CAPS
300.0000 mg | ORAL_CAPSULE | ORAL | Status: AC
  Administered 2022-07-30: 300 mg via ORAL

## 2022-07-30 MED ORDER — ONDANSETRON HCL 4 MG/2ML IJ SOLN
INTRAMUSCULAR | Status: AC
  Filled 2022-07-30: qty 2

## 2022-07-30 MED ORDER — OXYCODONE HCL 5 MG/5ML PO SOLN: 5.0000 mg | Freq: Once | ORAL | Status: DC | PRN

## 2022-07-30 MED ORDER — PROPOFOL 10 MG/ML IV BOLUS
INTRAVENOUS | Status: DC | PRN
  Administered 2022-07-30: 150 mg via INTRAVENOUS

## 2022-07-30 SURGICAL SUPPLY — 37 items
BLADE CLIPPER SENSICLIP SURGIC (BLADE) IMPLANT
BLADE SURG 15 STRL LF DISP TIS (BLADE) ×2 IMPLANT
BLADE SURG 15 STRL SS (BLADE) ×6
BLADE SURG SZ11 CARB STEEL (BLADE) IMPLANT
CATH ROBINSON RED A/P 16FR (CATHETERS) ×2 IMPLANT
DRSG TELFA 3X8 NADH STRL (GAUZE/BANDAGES/DRESSINGS) IMPLANT
ELECT BLADE TIP CTD 4 INCH (ELECTRODE) IMPLANT
GAUZE 4X4 16PLY ~~LOC~~+RFID DBL (SPONGE) ×2 IMPLANT
GAUZE PACKING IODOFORM 1/4X15 (PACKING) IMPLANT
GAUZE SPONGE 4X4 12PLY STRL (GAUZE/BANDAGES/DRESSINGS) IMPLANT
GLOVE BIO SURGEON STRL SZ 6 (GLOVE) ×4 IMPLANT
GLOVE BIOGEL PI IND STRL 6.5 (GLOVE) ×2 IMPLANT
GOWN STRL REUS W/TWL LRG LVL3 (GOWN DISPOSABLE) ×2 IMPLANT
HEMOSTAT SURGICEL 2X14 (HEMOSTASIS) IMPLANT
KIT TURNOVER CYSTO (KITS) ×2 IMPLANT
NDL HYPO 25X1 1.5 SAFETY (NEEDLE) ×2 IMPLANT
NEEDLE HYPO 25X1 1.5 SAFETY (NEEDLE) ×2 IMPLANT
NS IRRIG 500ML POUR BTL (IV SOLUTION) ×2 IMPLANT
PACK PERINEAL COLD (PAD) ×2 IMPLANT
PACK VAGINAL WOMENS (CUSTOM PROCEDURE TRAY) ×2 IMPLANT
PACKING GAUZE IODOFORM 1INX5YD (GAUZE/BANDAGES/DRESSINGS) IMPLANT
PUNCH BIOPSY DERMAL 3 (INSTRUMENTS) IMPLANT
PUNCH BIOPSY DERMAL 3MM (INSTRUMENTS)
PUNCH BIOPSY DERMAL 4MM (INSTRUMENTS) IMPLANT
SUT VIC AB 0 SH 27 (SUTURE) ×2 IMPLANT
SUT VIC AB 2-0 CT2 27 (SUTURE) IMPLANT
SUT VIC AB 2-0 SH 27 (SUTURE)
SUT VIC AB 2-0 SH 27XBRD (SUTURE) IMPLANT
SUT VIC AB 3-0 SH 27 (SUTURE) ×2
SUT VIC AB 3-0 SH 27X BRD (SUTURE) ×2 IMPLANT
SUT VIC AB 4-0 PS2 18 (SUTURE) ×2 IMPLANT
SUT VIC AB 4-0 SH 27 (SUTURE)
SUT VIC AB 4-0 SH 27XANBCTRL (SUTURE) IMPLANT
SWAB OB GYN 8IN STERILE 2PK (MISCELLANEOUS) IMPLANT
SYR BULB IRRIG 60ML STRL (SYRINGE) ×2 IMPLANT
TOWEL OR 17X26 10 PK STRL BLUE (TOWEL DISPOSABLE) ×4 IMPLANT
WATER STERILE IRR 1000ML POUR (IV SOLUTION) ×2 IMPLANT

## 2022-07-30 NOTE — Anesthesia Preprocedure Evaluation (Addendum)
Anesthesia Evaluation  Patient identified by MRN, date of birth, ID band Patient awake    Reviewed: Allergy & Precautions, H&P , NPO status , Patient's Chart, lab work & pertinent test results  History of Anesthesia Complications (+) PONV and history of anesthetic complications (did well w/ TIVA last anesthetic)  Airway Mallampati: II  TM Distance: >3 FB Neck ROM: Full    Dental  (+) Dental Advisory Given, Teeth Intact   Pulmonary neg pulmonary ROS   Pulmonary exam normal breath sounds clear to auscultation       Cardiovascular negative cardio ROS Normal cardiovascular exam Rhythm:Regular Rate:Normal     Neuro/Psych negative neurological ROS  negative psych ROS   GI/Hepatic Neg liver ROS,GERD  Controlled,,  Endo/Other  negative endocrine ROS    Renal/GU negative Renal ROS  negative genitourinary   Musculoskeletal negative musculoskeletal ROS (+)    Abdominal   Peds negative pediatric ROS (+)  Hematology negative hematology ROS (+)   Anesthesia Other Findings   Reproductive/Obstetrics Postop hematoma s/p vulvectomy 06/04/22                             Anesthesia Physical Anesthesia Plan  ASA: 2  Anesthesia Plan: General   Post-op Pain Management: Tylenol PO (pre-op)*   Induction: Intravenous  PONV Risk Score and Plan: 4 or greater and Ondansetron, Dexamethasone, Midazolam, Treatment may vary due to age or medical condition, Propofol infusion and TIVA  Airway Management Planned: LMA  Additional Equipment: None  Intra-op Plan:   Post-operative Plan: Extubation in OR  Informed Consent: I have reviewed the patients History and Physical, chart, labs and discussed the procedure including the risks, benefits and alternatives for the proposed anesthesia with the patient or authorized representative who has indicated his/her understanding and acceptance.     Dental advisory  given  Plan Discussed with: CRNA  Anesthesia Plan Comments:        Anesthesia Quick Evaluation

## 2022-07-30 NOTE — Transfer of Care (Signed)
Immediate Anesthesia Transfer of Care Note  Patient: Virga Haltiwanger  Procedure(s) Performed: INCISION AND DRAINAGE HEMATOMA OF VULVA WITH PACKING PELVIC EXAM UNDER ANESTHESIA (Bilateral)  Patient Location: PACU  Anesthesia Type:General  Level of Consciousness: awake, alert , and oriented  Airway & Oxygen Therapy: Patient Spontanous Breathing  Post-op Assessment: Report given to RN and Post -op Vital signs reviewed and stable  Post vital signs: Reviewed and stable  Last Vitals:  Vitals Value Taken Time  BP 150/77 07/30/22 1520  Temp    Pulse 55 07/30/22 1523  Resp 11 07/30/22 1523  SpO2 94 % 07/30/22 1523  Vitals shown include unvalidated device data.  Last Pain:  Vitals:   07/30/22 1151  TempSrc: Oral  PainSc: 6       Patients Stated Pain Goal: 3 (61/68/37 2902)  Complications: No notable events documented.

## 2022-07-30 NOTE — Progress Notes (Signed)
Patient up to bathroom 4x4 gauze removed and replaced with sterile 4x4.  Small amount of drainage noted.

## 2022-07-30 NOTE — Anesthesia Postprocedure Evaluation (Signed)
Anesthesia Post Note  Patient: Jennifer Velez  Procedure(s) Performed: INCISION AND DRAINAGE HEMATOMA OF VULVA WITH PACKING PELVIC EXAM UNDER ANESTHESIA (Bilateral)     Patient location during evaluation: PACU Anesthesia Type: General Level of consciousness: awake and alert, oriented and patient cooperative Pain management: pain level controlled Vital Signs Assessment: post-procedure vital signs reviewed and stable Respiratory status: spontaneous breathing, nonlabored ventilation and respiratory function stable Cardiovascular status: blood pressure returned to baseline and stable Postop Assessment: no apparent nausea or vomiting Anesthetic complications: no   No notable events documented.  Last Vitals:  Vitals:   07/30/22 1521 07/30/22 1530  BP: (!) 150/77 (!) 150/78  Pulse: (!) 54 (!) 50  Resp: 10 13  Temp: 36.5 C   SpO2: 95% 95%    Last Pain:  Vitals:   07/30/22 1530  TempSrc:   PainSc: 0-No pain                 Pervis Hocking

## 2022-07-30 NOTE — Anesthesia Procedure Notes (Signed)
Procedure Name: LMA Insertion Date/Time: 07/30/2022 2:06 PM  Performed by: Justice Rocher, CRNAPre-anesthesia Checklist: Patient identified, Emergency Drugs available, Suction available, Patient being monitored and Timeout performed Patient Re-evaluated:Patient Re-evaluated prior to induction Oxygen Delivery Method: Circle system utilized Preoxygenation: Pre-oxygenation with 100% oxygen Induction Type: IV induction Ventilation: Mask ventilation without difficulty LMA: LMA inserted LMA Size: 4.0 Number of attempts: 1 Airway Equipment and Method: Bite block Placement Confirmation: positive ETCO2, breath sounds checked- equal and bilateral and CO2 detector Tube secured with: Tape Dental Injury: Teeth and Oropharynx as per pre-operative assessment

## 2022-07-30 NOTE — H&P (Signed)
Brief Pre-operative History & Physical  Patient name: Jennifer Velez CSN: [id number] MRN: [id number] Admit Date: 07/30/2022 Date of Surgery: 07/30/2022 Performing Service: Gynecology   Code Status: Full Code    Assessment & Plan    Jennifer Velez is a 57 y.o. female with POST OP HEMATOMA, who presents for: Procedure(s) (LRB): INCISION AND DRAINAGE HEMATOMA OF VULVA WITH PACKING (N/A) PELVIC EXAM UNDER ANESTHESIA (Bilateral).   Consent obtained via telephone and is accurate. Risks, benefits, and alternatives to surgery were reviewed, and all questions were answered.  Proceed to the OR as planned.     History of Present Illness:  Jennifer Velez is a 57 y.o. female with POST OP HEMATOMA. She was recently seen in clinic, where a detailed HPI can be found. She was noted to benefit from: Procedure(s) (LRB): INCISION AND DRAINAGE HEMATOMA OF VULVA WITH PACKING (N/A) PELVIC EXAM UNDER ANESTHESIA (Bilateral).    Allergies Sulfonamide derivatives  Medications   Current Facility-Administered Medications  Medication Dose Route Frequency Provider Last Rate Last Admin   acetaminophen (TYLENOL) tablet 1,000 mg  1,000 mg Oral On Call to [place], Melissa D, NP       ceFAZolin (ANCEF) IVPB 2g/100 mL premix  2 g Intravenous On Call to OR Cross, Melissa D, NP       dexamethasone (DECADRON) injection 4 mg  4 mg Intravenous On Call to OR Cross, Melissa D, NP       ketorolac (TORADOL) 15 MG/ML injection 15 mg  15 mg Intravenous On Call to Esko, Lenna Sciara D, NP       lactated ringers infusion   Intravenous Continuous Pervis Hocking, DO 50 mL/hr at 07/30/22 1210 New Bag at 07/30/22 1210   scopolamine (TRANSDERM-SCOP) 1 MG/3DAYS 1.5 mg  1 patch Transdermal On Call to OR Joylene John D, NP   1.5 mg at 07/30/22 1154    Vital Signs BP 125/76   Pulse 78   Temp 98.2 F (36.8 C) (Oral)   Resp 17   Ht '5\' 7"'$  (1.702 m)   Wt 191 lb 9.6 oz (86.9 kg)   LMP 11/02/2012   SpO2 99%   BMI 30.01  kg/m  Facility age limit for growth %iles is 20 years. Facility age limit for growth %iles is 20 years..   Physical Exam General: Well developed, appears stated age, in no acute distress  Mental status: Alert and oriented x3 Cardiovascular: Normal Pulmonary: Symmetric chest rise, unlabored breathing Relevant System for Surgery: Surgical site examination deferred to the OR   Labs and Studies: Lab Results  Component Value Date   WBC 5.4 12/21/2020   HGB 14.9 12/21/2020   HCT 41.6 12/21/2020   PLT 261.0 12/21/2020    No results found for: "INR", "APTT" \

## 2022-07-30 NOTE — Op Note (Signed)
GYNECOLOGIC ONCOLOGY OPERATIVE NOTE  Date of Service: 07/30/2022  Preoperative Diagnosis: Postop hematoma, indurated wound  Postoperative Diagnosis: Same, small volume necrotic subcutaneous tissue  Procedures: Incision and debridement of vulvar wound  Surgeon: [name], MD  Assistants: None  Anesthesia: Choice  Estimated Blood Loss: 5 mL    Fluids: 700 ml, crystalloid  Urine Output: 200 ml, clear yellow  Findings: Perineal body with glandular granulation tissue, treated with silver nitrate. Remainder of external genitalia and vagina normal in appearance. Incision over left gluteus from prior rotational wound flap is firm with surrounding induration without particular fluctuance. No purulence or discrete hematoma identified. Indurated skin and small portion of necrotic subcutaneous tissue that is debrided underlying incision. On rectal exam, no evidence of fistula to wound.   Specimens:  ID Type Source Tests Collected by Time Destination  A : left gluteal wound Tissue Wound AEROBIC/ANAEROBIC CULTURE W GRAM STAIN (SURGICAL/DEEP WOUND) [name], MD 12/19/3084 [id number]     Complications:  None  Indications for Procedure: Jennifer Velez is a 57 y.o. woman with continue pain and induration following antibiotic treatment for wound infection and soft tissue ultrasound with possible hematoma.  Prior to the procedure, all risks, benefits, and alternatives were discussed and informed surgical consent was signed.  Procedure: Patient was taken to the operating room where general anesthesia was achieved.  She was positioned in dorsal lithotomy and prepped and draped in sterile fashion.  An in and out catheter was inserted into the bladder.  The above findings were noted.  The wound was incised with a scalpel over the prior scar. The subcutaneous tissue was fractionated with a hemostat. A portion of subcutaneous tissue was grasped and excised with a scalpel to send for a tissue  culture. A small amount of grey necrotic appearing tissue underlying the scar was debrided sharply with a scalpel until healthy bleeding tissue was identified. The wound was then irrigated and made hemostatic with cautery. The wound was packed with 1/2inch iodoform packing tape and covered with 4x4 gauze. The granulation tissue over the perineal body was treated with silver nitrate.  Patient tolerated the procedure well. Sponge, lap, and instrument counts were correct.  2g of Ancef was given prior to incision.  Patient was extubated and taken to the PACU in stable condition.  [name], MD Gynecologic Oncology

## 2022-07-31 ENCOUNTER — Encounter (HOSPITAL_BASED_OUTPATIENT_CLINIC_OR_DEPARTMENT_OTHER): Payer: Self-pay | Admitting: Psychiatry

## 2022-07-31 ENCOUNTER — Telehealth: Payer: Self-pay | Admitting: Oncology

## 2022-07-31 DIAGNOSIS — N99840 Postprocedural hematoma of a genitourinary system organ or structure following a genitourinary system procedure: Secondary | ICD-10-CM | POA: Diagnosis not present

## 2022-07-31 MED ORDER — IBUPROFEN 800 MG PO TABS
ORAL_TABLET | ORAL | Status: AC
  Filled 2022-07-31: qty 1

## 2022-07-31 MED ORDER — IBUPROFEN 800 MG PO TABS
800.0000 mg | ORAL_TABLET | Freq: Four times a day (QID) | ORAL | Status: DC | PRN
  Administered 2022-07-31: 800 mg via ORAL

## 2022-07-31 MED ORDER — IBUPROFEN 200 MG PO TABS
ORAL_TABLET | ORAL | Status: AC
  Filled 2022-07-31: qty 3

## 2022-07-31 MED ORDER — IBUPROFEN 800 MG PO TABS: 800.0000 mg | ORAL_TABLET | Freq: Three times a day (TID) | ORAL | 1 refills | Status: DC | PRN

## 2022-07-31 MED ORDER — TRAMADOL HCL 50 MG PO TABS: 50.0000 mg | ORAL_TABLET | Freq: Once | ORAL | Status: DC

## 2022-07-31 MED ORDER — TRAMADOL HCL 50 MG PO TABS
ORAL_TABLET | ORAL | Status: AC
  Filled 2022-07-31: qty 1

## 2022-07-31 NOTE — Telephone Encounter (Signed)
Received a call back from [facility].  They are not able to take Jennifer Velez at this time due to staffing issues.  Called and faxed referral to [facility] at [phone].  They will review the referral and call us back.

## 2022-07-31 NOTE — Discharge Instructions (Addendum)
AFTER SURGERY INSTRUCTIONS   Return to work:  variable based on occupation   We recommend purchasing several bags of frozen green peas and dividing them into ziploc bags. You will want to keep these in the freezer and have them ready to use as ice packs to the vulvar incision. Once the ice pack is no longer cold, you can get another from the freezer. The frozen peas mold to your body better than a regular ice pack.   PLAN TO CHANGE THE DRESSING WITH PACKING AT LEAST TWICE DAILY AND IN BETWEEN IF IT BECOMES SOILED.  We will continue working on home health and will contact you with further information.   Activity: 1. Be up and out of the bed during the day.  Take a nap if needed.  You may walk up steps but be careful and use the hand rail.  Stair climbing will tire you more than you think, you may need to stop part way and rest.    2. No lifting or straining for 2 weeks minimum, 4 weeks ideal over 10 pounds. No pushing, pulling, straining for 2 weeks minimum, 4 weeks if possible.   3. No driving for minimum 1 week after surgery.  Do not drive if you are taking narcotic pain medicine and make sure that your reaction time has returned.    4. You can shower as soon as the next day after surgery. Shower daily. No tub baths or submerging your body in water until cleared by your surgeon. If you have the soap that was given to you by pre-surgical testing that was used before surgery, you do not need to use it afterwards because this can irritate your incisions.    5. No sexual activity and nothing in the vagina for 4 weeks.   6. You may experience vulvar spotting and discharge after surgery.  The spotting is normal but if you experience heavy bleeding, call our office.   7. Take Tylenol or ibuprofen first for pain if you are able to take these medications and only use narcotic pain medication for severe pain not relieved by the Tylenol or Ibuprofen.  Monitor your Tylenol intake to a max of 4,000 mg in  a 24 hour period. You can alternate these medications after surgery.   Diet: 1. Low sodium Heart Healthy Diet is recommended but you are cleared to resume your normal (before surgery) diet after your procedure.   2. It is safe to use a laxative, such as Miralax or Colace, if you have difficulty moving your bowels. You have been prescribed Sennakot at bedtime every evening to keep bowel movements regular and to prevent constipation.     Wound Care: 1. Keep clean and dry.  Shower daily.   Reasons to call the Doctor: Fever - Oral temperature greater than 100.4 degrees Fahrenheit Foul-smelling vaginal discharge Difficulty urinating Nausea and vomiting Increased pain at the site of the incision that is unrelieved with pain medicine. Difficulty breathing with or without chest pain New calf pain especially if only on one side Sudden, continuing increased vaginal bleeding with or without clots.   Contacts: For questions or concerns you should contact:   Dr. [name] at [facility], NP at [phone]   After Hours: call [phone] and have the GYN Oncologist paged/contacted (after 5 pm or on the weekends).   Messages sent via mychart are for non-urgent matters and are not responded to after hours so for urgent needs, please call the  after hours number.

## 2022-07-31 NOTE — Discharge Summary (Signed)
Physician Discharge Summary  Patient ID: Jennifer Velez MRN: [id number] DOB/AGE: 1966/07/01 57 y.o.  Admit date: 07/30/2022 Discharge date: 07/31/2022  Admission Diagnoses: Hematoma of vulva  Discharge Diagnoses:  Principal Problem:   Hematoma of vulva Active Problems:   Wound, open, buttock   Discharged Condition:  The patient is in good condition and stable for discharge.    Hospital Course: On 07/30/2022, the patient underwent the following: Procedure(s): INCISION AND DRAINAGE HEMATOMA OF VULVA WITH PACKING, PELVIC EXAM UNDER ANESTHESIA. The postoperative course was uneventful.  She was discharged to home on postoperative day 1 tolerating a regular diet, ambulating, voiding, pain controlled with PO medications. Dressing change with packing removal/insertion performed prior to discharge with patient's husband instructed on this as well. Our office will work on obtaining home health services as well if insurance approves. She is advised to change the vulvar dressing at least twice daily and in between if soiled.   Consults: None  Significant Diagnostic Studies: Labs from 07/30/2022   Treatments: Surgery: see above  Discharge Exam: Blood pressure 125/61, pulse 86, temperature 98.1 F (36.7 C), resp. rate 17, height '5\' 7"'$  (1.702 m), weight 191 lb 9.6 oz (86.9 kg), last menstrual period 11/02/2012, SpO2 97 %. General appearance: alert, cooperative, and no distress Resp: clear to auscultation bilaterally Cardio: regular rate and rhythm, S1, S2 normal, no murmur, click, rub or gallop GI: soft, non-tender; bowel sounds normal; no masses,  no organomegaly Extremities: extremities normal, atraumatic, no cyanosis or edema Incision/Wound: Packing removed from left vulvar open wound. Wound bed pink without excessive drainage or significant surrounding erythema. With husband observing and chaperone, Elmo Putt navigator, present, 1/2 inch iodoform packing replaced in the wound. Patient  reported tenderness with insertion but able to tolerate. Dry gauze placed over wound with mesh panties to hold in place. Supplies given.   Disposition: Discharge disposition: 01-Home or Self Care       Discharge Instructions     Call MD for:  difficulty breathing, headache or visual disturbances   Complete by: As directed    Call MD for:  extreme fatigue   Complete by: As directed    Call MD for:  hives   Complete by: As directed    Call MD for:  persistant dizziness or light-headedness   Complete by: As directed    Call MD for:  persistant nausea and vomiting   Complete by: As directed    Call MD for:  redness, tenderness, or signs of infection (pain, swelling, redness, odor or green/yellow discharge around incision site)   Complete by: As directed    Call MD for:  severe uncontrolled pain   Complete by: As directed    Call MD for:  temperature >100.4   Complete by: As directed    Diet - low sodium heart healthy   Complete by: As directed    Discharge wound care:   Complete by: As directed    Plan to change the packing and dressing on the vulva at least twice daily   Driving Restrictions   Complete by: As directed    No driving for around 1 week(s).  Do not take narcotics and drive. You need to make sure your reaction time has returned.   Increase activity slowly   Complete by: As directed    Lifting restrictions   Complete by: As directed    No lifting greater than 10 lbs, pushing, pulling, straining for 2 weeks minimum.   Sexual Activity Restrictions  Complete by: As directed    No sexual activity, nothing in the vagina, for 4-6 weeks.      Allergies as of 07/31/2022       Reactions   Sulfonamide Derivatives Hives        Medication List     TAKE these medications    fluorouracil 5 % cream Commonly known as: EFUDEX Apply 1 Application topically daily as needed (skin spots).   ibuprofen 800 MG tablet Commonly known as: ADVIL Take 1 tablet (800 mg  total) by mouth every 8 (eight) hours as needed for moderate pain. What changed:  medication strength how much to take reasons to take this Notes to patient: Take next dose at 4:00 pm today   senna-docusate 8.6-50 MG tablet Commonly known as: Senokot-S Take 2 tablets by mouth at bedtime. For AFTER surgery, do not take if having diarrhea   traMADol 50 MG tablet Commonly known as: ULTRAM Take 1 tablet (50 mg total) by mouth every 6 (six) hours as needed for severe pain. Notes to patient: Take next dose at 2:00 pm today   VITAMIN C PO Take 1,000 mg by mouth in the morning.   Vitamin D 50 MCG (2000 UT) tablet Take 2,000 Units by mouth in the morning.               Discharge Care Instructions  (From admission, onward)           Start     Ordered   07/31/22 0000  Discharge wound care:       Comments: Plan to change the packing and dressing on the vulva at least twice daily   07/31/22 0954            Follow-up Information     Joylene John D, NP Follow up on 08/02/2022.   Specialty: Gynecologic Oncology Why: at 1 pm at the [facility] for wound check. Contact information: [place] [address] [phone]         [name], MD Follow up on 08/05/2022.   Specialty: Gynecologic Oncology Why: at 9:15 am at the [facility] for post-op check Contact information: [address] [place]  [address] [phone]                 Greater than thirty minutes were spend for face to face discharge instructions and discharge orders/summary in EPIC.   Signed: Dorothyann Gibbs 07/31/2022, 12:15 PM

## 2022-07-31 NOTE — Telephone Encounter (Signed)
Left a message for Jennifer Velez at [facility] to set up home health. Requested a return call.

## 2022-08-01 ENCOUNTER — Telehealth: Payer: Self-pay | Admitting: *Deleted

## 2022-08-01 ENCOUNTER — Telehealth: Payer: Self-pay | Admitting: Oncology

## 2022-08-01 NOTE — Telephone Encounter (Signed)
[facility] and they are not able to accept patient because they do not have enough staff.  Left a message on the referral line for [facility] to see if they can accept patient for home health wound care.

## 2022-08-01 NOTE — Telephone Encounter (Signed)
Spoke with Jennifer Velez today. She states she is eating, drinking and urinating well. She has not had a BM yet but is passing gas. She has not started senokot yet but plans to start today. Encouraged to drink plenty of fluids. She denies fever or chills. Husband is packing drainage site. She rates her pain 3/10. Her pain is controlled with Tramadol and Motrin.     Instructed to call office with any fever, chills, purulent drainage, uncontrolled pain or any other questions or concerns. Patient verbalizes understanding.   Pt aware of post op appointments as well as the office number [phone] and after hours number [phone] to call if she has any questions or concerns

## 2022-08-01 NOTE — Telephone Encounter (Signed)
Call to patient for post-op check. Line busy X 2 attempts.

## 2022-08-01 NOTE — Progress Notes (Unsigned)
Gynecologic Oncology Post-op Follow Up, Wound Check  Jennifer Velez is a 57 year old female who presents to the office today for a wound check. She is s/p incision and debridement of her vulvar wound on 07/30/2022 with Dr. 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 After her procedure on 06/04/2022, she experienced prolonged healing with breakdown of the resection site and surgical site infection. She was prescribed two courses of oral antibiotics. A soft tissue ultrasound was ordered due to ongoing induration returning   Wound measurements: 5 cm in length, 1.5 cm in width, 1/2 cm in depth.

## 2022-08-02 ENCOUNTER — Other Ambulatory Visit: Payer: Self-pay

## 2022-08-02 ENCOUNTER — Inpatient Hospital Stay: Payer: 59 | Attending: Psychiatry | Admitting: Gynecologic Oncology

## 2022-08-02 VITALS — BP 143/73 | HR 77 | Temp 98.5°F | Resp 18

## 2022-08-02 DIAGNOSIS — D071 Carcinoma in situ of vulva: Secondary | ICD-10-CM | POA: Insufficient documentation

## 2022-08-02 DIAGNOSIS — S31829D Unspecified open wound of left buttock, subsequent encounter: Secondary | ICD-10-CM | POA: Insufficient documentation

## 2022-08-02 DIAGNOSIS — Z4889 Encounter for other specified surgical aftercare: Secondary | ICD-10-CM

## 2022-08-02 DIAGNOSIS — R102 Pelvic and perineal pain: Secondary | ICD-10-CM | POA: Insufficient documentation

## 2022-08-02 DIAGNOSIS — Z9079 Acquired absence of other genital organ(s): Secondary | ICD-10-CM | POA: Insufficient documentation

## 2022-08-02 NOTE — Patient Instructions (Signed)
Plan to follow up on Monday as scheduled. Please call for any needs.  Continue with at least twice daily dressing changes.

## 2022-08-04 LAB — AEROBIC/ANAEROBIC CULTURE W GRAM STAIN (SURGICAL/DEEP WOUND)
Culture: NORMAL
Gram Stain: NONE SEEN

## 2022-08-05 ENCOUNTER — Other Ambulatory Visit: Payer: Self-pay

## 2022-08-05 ENCOUNTER — Inpatient Hospital Stay (HOSPITAL_BASED_OUTPATIENT_CLINIC_OR_DEPARTMENT_OTHER): Payer: 59 | Admitting: Psychiatry

## 2022-08-05 VITALS — BP 127/66 | HR 83 | Temp 98.5°F | Resp 16 | Wt 193.0 lb

## 2022-08-05 DIAGNOSIS — R102 Pelvic and perineal pain: Secondary | ICD-10-CM

## 2022-08-05 DIAGNOSIS — Z9079 Acquired absence of other genital organ(s): Secondary | ICD-10-CM

## 2022-08-05 DIAGNOSIS — D071 Carcinoma in situ of vulva: Secondary | ICD-10-CM

## 2022-08-05 DIAGNOSIS — S31829D Unspecified open wound of left buttock, subsequent encounter: Secondary | ICD-10-CM

## 2022-08-05 MED ORDER — LIDOCAINE 5 % EX OINT: 1.0000 | TOPICAL_OINTMENT | Freq: Three times a day (TID) | CUTANEOUS | 1 refills | Status: DC | PRN

## 2022-08-05 NOTE — Patient Instructions (Signed)
Plan to continue with at least twice daily dressing changes. We will plan to see you in one week or sooner if needed.  We will send in a stronger topical lidocaine you can use.   You can also take 2 tramadols (100 mg total) prior to dressing changes to see if this helps manage your pain.  Please call the office for any needs.

## 2022-08-05 NOTE — Telephone Encounter (Signed)
Received a call back from [place] at [facility].  They are not able to accept Jennifer Velez due to her insurance.

## 2022-08-05 NOTE — Telephone Encounter (Signed)
Left another message for [facility] regarding referral.

## 2022-08-05 NOTE — Progress Notes (Signed)
Gynecologic Oncology Return Clinic Visit  Date of Service: 08/05/2022 Referring Provider: Patton Salles, MD   Assessment & Plan: Jennifer Velez is a 57 y.o. 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 Now s/p wound incision and debridement on 07/30/22 with wound packed with WTD dressing, changing BID.  Postop wound complication: - Wound evaluated with healthy granulation tissue. Appears already somewhat more shallow than at time of surgery. - No growth on culture from tissue from wound at time of I&D. - Continue BID WTD dressing changes. - Okay to take 2 tabs of tramadol for dressing change. - Prescription strength lidocaine sent for dressing change.  VIN3: - Recommend close surveillance with q40mo exams x 38yrs  RTC approximately 1 week.  Clide Cliff, MD Gynecologic Oncology   Medical Decision Making I personally spent  TOTAL 20 minutes face-to-face and non-face-to-face in the care of this patient, which includes all pre, intra, and post visit time on the date of service.   ----------------------- Reason for Visit: Wound check, treatment counseling  Treatment history: Patient noted on vulva at time of annual exam on 03/13/2022. 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  9 colposcopy on 04/04/2022 noted raised area on the right perineum and new biopsy obtained which showed VIN 3. Underwent simple partial vulvectomy with rhomboid rotational flap and CO2 laser ablation on 06/04/2022.  Final pathology showing VIN 3, negative margins. Postop course complicated by wound break requiring antibiotics x2 followed by incision and debridement of left gluteal incision from flap (07/30/22), packed WTD to close by secondary intention.  Interval History: Pt underwent incision and debridement on 07/30/22. She was seen in clinic on 2/2 for wound  check and dressing change. Pt reports that she continues to use schedule motrin for pain and tramadol 50mg  and lidocaine to help with pain surrounding packing/dressing change. No fevers/chills at home. She is trying to walk/mobilize at home.  Past Medical/Surgical History: Past Medical History:  Diagnosis Date   CIN III (cervical intraepithelial neoplasia III)    Complication of anesthesia    Endometriosis    Fibroid    GERD (gastroesophageal reflux disease)    IBS (irritable bowel syndrome)    Osteoporosis 05/2017   T score -2.0 prior DEXA 2016 T score -2.8 AP spine    Past Surgical History:  Procedure Laterality Date   CERVICAL BIOPSY  W/ LOOP ELECTRODE EXCISION  1999   CO2 LASER APPLICATION N/A 06/04/2022   Procedure: CO2 LASER APPLICATION;  Surgeon: Clide Cliff, MD;  Location: [facility];  Service: Gynecology;  Laterality: N/A;   COLPOSCOPY     EXPLORATORY LAPAROTOMY  2004   myomectomy and excision endometriosis   INCISION AND DRAINAGE ABSCESS N/A 07/30/2022   Procedure: INCISION AND DRAINAGE HEMATOMA OF VULVA WITH PACKING;  Surgeon: Clide Cliff, MD;  Location: [facility];  Service: Gynecology;  Laterality: N/A;   PELVIC LAPAROSCOPY  1992   x2, for endometriosis   SKIN CANCER EXCISION     Squamous cell   VULVECTOMY N/A 06/04/2022   Procedure: WIDE EXCISION VULVECTOMY;  Surgeon: Clide Cliff, MD;  Location: [facility];  Service: Gynecology;  Laterality: N/A;    Family History  Problem Relation Age of Onset   Diverticulitis Mother    Alzheimer's disease Mother    Alzheimer's disease Maternal Grandmother    Alzheimer's disease Maternal Aunt  Alzheimer's disease Maternal Aunt    Ulcerative colitis Maternal Aunt    Ulcerative colitis Cousin    Colon cancer Neg Hx    Esophageal cancer Neg Hx    Rectal cancer Neg Hx    Stomach cancer Neg Hx    Colon polyps Neg Hx    Breast cancer Neg Hx    Ovarian cancer Neg Hx    Endometrial cancer Neg Hx      Social History   Socioeconomic History   Marital status: Married    Spouse name: Not on file   Number of children: Not on file   Years of education: Not on file   Highest education level: Not on file  Occupational History   Occupation: Home maker  Tobacco Use   Smoking status: Never   Smokeless tobacco: Never  Vaping Use   Vaping Use: Never used  Substance and Sexual Activity   Alcohol use: No    Alcohol/week: 0.0 standard drinks of alcohol   Drug use: No   Sexual activity: Not Currently    Birth control/protection: Post-menopausal    Comment: 1st intercourse 66 yo-5 partners  Other Topics Concern   Not on file  Social History Narrative   3 adopted children, 2 boys and 1 girl   Social Determinants of Corporate investment banker Strain: Not on file  Food Insecurity: Not on file  Transportation Needs: Not on file  Physical Activity: Not on file  Stress: Not on file  Social Connections: Not on file    Current Medications:  Current Outpatient Medications:    lidocaine (XYLOCAINE) 5 % ointment, Apply 1 Application topically 3 (three) times daily as needed for moderate pain (to the vulva)., Disp: 35.44 g, Rfl: 1   Ascorbic Acid (VITAMIN C PO), Take 1,000 mg by mouth in the morning., Disp: , Rfl:    Cholecalciferol (VITAMIN D) 50 MCG (2000 UT) tablet, Take 2,000 Units by mouth in the morning., Disp: , Rfl:    fluorouracil (EFUDEX) 5 % cream, Apply 1 Application topically daily as needed (skin spots)., Disp: , Rfl:    ibuprofen (ADVIL) 800 MG tablet, Take 1 tablet (800 mg total) by mouth every 8 (eight) hours as needed for moderate pain., Disp: 30 tablet, Rfl: 1   senna-docusate (SENOKOT-S) 8.6-50 MG tablet, Take 2 tablets by mouth at bedtime. For AFTER surgery, do not take if having diarrhea, Disp: 30 tablet, Rfl: 0   traMADol (ULTRAM) 50 MG tablet, Take 1 tablet (50 mg total) by mouth every 6 (six) hours as needed for severe pain., Disp: 30 tablet, Rfl: 0  Review of  Symptoms: Complete 10-system review is positive for: Urinary frequency, painful urination, vaginal discharge Physical Exam: BP 127/66 (BP Location: Left Arm, Patient Position: Sitting)   Pulse 83   Temp 98.5 F (36.9 C) (Oral)   Resp 16   Wt 193 lb (87.5 kg)   LMP 11/02/2012   SpO2 97%   BMI 30.23 kg/m  General: Alert, oriented, appears mildly uncomfortable, sitting to the side HEENT: Normocephalic, atraumatic. Neck symmetric without masses.  Chest: Normal work of breathing.  Extremities: Grossly normal range of motion.  Warm, well perfused.  No edema bilaterally. Skin: No rashes or lesions noted. GU: External genitalia with evidence of recent I&D. Left gluteal incision opening measures approximately 5cm length (left/right), 1.5cm width (anterior/posterio), 1cm depth. Well healing granulation tissue. Wound irrigated and repacked. Midline perineal body with granulation tissue, healing well since treatment with silver nitrate in  OR (less raised glandular tissue). Exam chaperoned by Warner Mccreedy, NP   Laboratory & Radiologic Studies: Specimen Description WOUND Performed at [facility], [address] [place], [place] [address]  Special Requests LEFT GLUTEAL  Gram Stain NO WBC SEEN NO ORGANISMS SEEN  Culture RARE NORMAL SKIN FLORA NO ANAEROBES ISOLATED Performed at [facility], [address] [place], [place] [address]  Report Status 08/04/2022 FINAL

## 2022-08-07 ENCOUNTER — Encounter: Payer: Self-pay | Admitting: Psychiatry

## 2022-08-08 ENCOUNTER — Telehealth: Payer: Self-pay | Admitting: Surgery

## 2022-08-08 NOTE — Telephone Encounter (Signed)
Patient called in stating she is having pain during packing. She is taking pain medication prior to packing and her husband is applying lidocaine gel. Patient wanted to know if she could decrease to packing once a day instead of twice a day. Advised patient this may slow the healing process but that Dr Ernestina Patches would be notified and our office would let her know if she has additional recommendations. Husband also states that it is getting more difficult to pack in the area closer to her rectum, it is harder to get packing in. Encouraged that this is most likely part of the healing process, as the wound has to heal from the inside out. Recommended taking pain medication at least 30 minutes prior to packing and ok to use lidocaine gel up to 3x daily as needed. Patient and her husband had no other concerns at this time.

## 2022-08-12 ENCOUNTER — Telehealth: Payer: Self-pay | Admitting: *Deleted

## 2022-08-12 ENCOUNTER — Other Ambulatory Visit: Payer: Self-pay

## 2022-08-12 ENCOUNTER — Encounter: Payer: Self-pay | Admitting: Psychiatry

## 2022-08-12 ENCOUNTER — Inpatient Hospital Stay (HOSPITAL_BASED_OUTPATIENT_CLINIC_OR_DEPARTMENT_OTHER): Payer: 59 | Admitting: Psychiatry

## 2022-08-12 VITALS — BP 126/74 | HR 82 | Temp 98.8°F | Resp 16 | Wt 191.0 lb

## 2022-08-12 DIAGNOSIS — Z9079 Acquired absence of other genital organ(s): Secondary | ICD-10-CM | POA: Diagnosis not present

## 2022-08-12 DIAGNOSIS — D071 Carcinoma in situ of vulva: Secondary | ICD-10-CM | POA: Diagnosis present

## 2022-08-12 DIAGNOSIS — R102 Pelvic and perineal pain: Secondary | ICD-10-CM

## 2022-08-12 DIAGNOSIS — Z7189 Other specified counseling: Secondary | ICD-10-CM

## 2022-08-12 DIAGNOSIS — S31829D Unspecified open wound of left buttock, subsequent encounter: Secondary | ICD-10-CM

## 2022-08-12 MED ORDER — GABAPENTIN 300 MG PO CAPS: 300.0000 mg | ORAL_CAPSULE | Freq: Three times a day (TID) | ORAL | 3 refills | Status: DC

## 2022-08-12 MED ORDER — ALPRAZOLAM 0.5 MG PO TABS: ORAL_TABLET | ORAL | 0 refills | Status: DC

## 2022-08-12 NOTE — Patient Instructions (Signed)
It was a pleasure to see you in clinic today. - Continue with dressing changes twice a day. - I will let you know about possible addition of medication or referral. - Return visit planned for next week  Thank you very much for allowing me to provide care for you today.  I appreciate your confidence in choosing our Gynecologic Oncology team at [facility].  If you have any questions about your visit today please call our office or send [place] a MyChart message and we will get back to you as soon as possible.

## 2022-08-12 NOTE — Telephone Encounter (Signed)
[facility] MRN # [id number]

## 2022-08-12 NOTE — Progress Notes (Signed)
Gynecologic Oncology Return Clinic Visit  Date of Service: 08/12/2022 Referring Provider: Nunzio Cobbs, MD   Assessment & Plan: Jennifer Velez is a 57 y.o. 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 Now s/p wound incision and debridement on 07/30/22 with wound packed with WTD dressing, changing BID.  Postop wound complication: - Wound evaluated with healthy granulation tissue.  Wound continues to lessen in size and may only need packing for another week or 2. - Continue BID WTD dressing changes. - Okay to take 2 tabs of tramadol for dressing change. -Continue with prescription strength lidocaine topically.  Vulvar pain: - Significant pain to light touch particularly over perineal body consistent with paresthesia. - Recommend pain treatment with gabapentin 300 mg 3 times daily.  Prescription sent. - Also discussed patient's care with Jennifer Velez provider for consideration of a nerve block such as a pudendal nerve block.  Feel that this may provide local relief the patient particularly if she continues her healing process.  Patient amenable to consider this although she is anxious about the actual procedure given her current discomfort. - Referral to [facility] placed.  1 tab of 0.5 mg Xanax prescribed.  Patient instructed not to take until she is able to discuss procedure with [facility] but she can bring with her and see if they are amenable to her taking prior to procedure.  VIN3: - Recommend close surveillance with q87moexams x 527yr RTC approximately 1 week.  [name] Gynecologic Oncology   Medical Decision Making I personally spent  TOTAL 25 minutes face-to-face and non-face-to-face in the care of this patient, which includes all pre, intra, and post visit time on the date of service.   ----------------------- Reason for  Visit: Wound check, vulvar pain  Treatment history: Patient noted on vulva at time of annual exam on 03/13/2022. 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  9 colposcopy on 04/04/2022 noted raised area on the right perineum and new biopsy obtained which showed VIN 3. Underwent simple partial vulvectomy with rhomboid rotational flap and CO2 laser ablation on 06/04/2022.  Final pathology showing VIN 3, negative margins. Postop course complicated by wound break requiring antibiotics x2 followed by incision and debridement of left gluteal incision from flap (07/30/22), packed WTD to close by secondary intention.  Interval History: Patient reports that she continues to have significant pain over the perineal body for which she is applying topical lidocaine.  Her husband continues to help with twice daily dressing changes of her wound.  They note a small amount of blood from the wound today and some yellow discharge from her perineal body.  She is using tramadol 100 for her dressing changes and topical lidocaine.  She is increasing her walking but still has difficulty with sitting on her bottom.  Past Medical/Surgical History: Past Medical History:  Diagnosis Date   CIN III (cervical intraepithelial neoplasia III)    Complication of anesthesia    Endometriosis    Fibroid    GERD (gastroesophageal reflux disease)    IBS (irritable bowel syndrome)    Osteoporosis 05/2017   T score -2.0 prior DEXA 2016 T score -2.8 AP spine    Past Surgical History:  Procedure Laterality Date   CERVICAL BIOPSY  W/ LOOP ELECTRODE EXCISION  [id number] CO2 LASER APPLICATION N/A [id number] Procedure: CO2 LASER APPLICATION;  Surgeon: Jennifer Bell, MD;  Location: [facility];  Service: Gynecology;  Laterality: N/A;   COLPOSCOPY     EXPLORATORY LAPAROTOMY  2004   myomectomy and excision endometriosis   INCISION AND DRAINAGE ABSCESS N/A 07/30/2022   Procedure: INCISION AND DRAINAGE HEMATOMA OF VULVA WITH PACKING;  Surgeon: Jennifer Bell, MD;  Location: [place];  Service: Gynecology;  Laterality: N/A;   PELVIC LAPAROSCOPY  1992   x2, for endometriosis   SKIN CANCER EXCISION     Squamous cell   VULVECTOMY N/A 06/04/2022   Procedure: WIDE EXCISION VULVECTOMY;  Surgeon: Jennifer Bell, MD;  Location: [facility];  Service: Gynecology;  Laterality: N/A;    Family History  Problem Relation Age of Onset   Diverticulitis Mother    Alzheimer's disease Mother    Alzheimer's disease Maternal Grandmother    Alzheimer's disease Maternal Aunt    Alzheimer's disease Maternal Aunt    Ulcerative colitis Maternal Aunt    Ulcerative colitis Cousin    Colon cancer Neg Hx    Esophageal cancer Neg Hx    Rectal cancer Neg Hx    Stomach cancer Neg Hx    Colon polyps Neg Hx    Breast cancer Neg Hx    Ovarian cancer Neg Hx    Endometrial cancer Neg Hx     Social History   Socioeconomic History   Marital status: Married    Spouse name: Not on file   Number of children: Not on file   Years of education: Not on file   Highest education level: Not on file  Occupational History   Occupation: Home maker  Tobacco Use   Smoking status: Never   Smokeless tobacco: Never  Vaping Use   Vaping Use: Never used  Substance and Sexual Activity   Alcohol use: No    Alcohol/week: 0.0 standard drinks of alcohol   Drug use: No   Sexual activity: Not Currently    Birth control/protection: Post-menopausal    Comment: 1st intercourse 24 yo-5 partners  Other Topics Concern   Not on file  Social History Narrative   3 adopted children, 2 boys and 1 girl   Social Determinants of Radio broadcast assistant Strain: Not on file  Food Insecurity: Not on file  Transportation Needs: Not on file  Physical Activity: Not on file  Stress: Not on file  Social Connections: Not on file    Current Medications:  Current Outpatient Medications:    Ascorbic Acid (VITAMIN C PO), Take 1,000 mg by mouth in the morning., Disp: ,  Rfl:    Cholecalciferol (VITAMIN D) 50 MCG (2000 UT) tablet, Take 2,000 Units by mouth in the morning., Disp: , Rfl:    fluorouracil (EFUDEX) 5 % cream, Apply 1 Application topically daily as needed (skin spots)., Disp: , Rfl:    ibuprofen (ADVIL) 800 MG tablet, Take 1 tablet (800 mg total) by mouth every 8 (eight) hours as needed for moderate pain., Disp: 30 tablet, Rfl: 1   lidocaine (XYLOCAINE) 5 % ointment, Apply 1 Application topically 3 (three) times daily as needed for moderate pain (to the vulva)., Disp: 35.44 g, Rfl: 1   senna-docusate (SENOKOT-S) 8.6-50 MG tablet, Take 2 tablets by mouth at bedtime. For AFTER surgery, do not take if having diarrhea, Disp: 30 tablet, Rfl: 0   traMADol (ULTRAM) 50 MG tablet, Take 1 tablet (50 mg total) by mouth every 6 (six) hours as needed for severe pain., Disp: 30 tablet, Rfl:  0  Review of Symptoms: Complete 10-system review is positive for: 1 day of diarrhea, burning on the outside with urination  Physical Exam: BP 126/74 (BP Location: Right Arm, Patient Position: Sitting)   Pulse 82   Temp 98.8 F (37.1 C) (Oral)   Resp 16   Wt 191 lb (86.6 kg)   LMP 11/02/2012   SpO2 99%   BMI 29.91 kg/m  General: Alert, oriented, appears mildly uncomfortable, sitting to the side HEENT: Normocephalic, atraumatic. Neck symmetric without masses.  Chest: Normal work of breathing.  Extremities: Grossly normal range of motion.  Warm, well perfused.  No edema bilaterally. Skin: No rashes or lesions noted. GU: External genitalia with evidence of recent I&D.  Perineal body in midline with continued granulation tissue.  Significant sharp stabbing pain at this area with light touch.  Granulation tissue treated with silver nitrate and topical lidocaine.  Wound examined and healing well with healthy granulation tissue.  Small area with blood treated with silver nitrate.  Portion that had previously tracked to the midline is now closed off.  Wound becoming more shallow.   Wound irrigated and repacked. Exam chaperoned by Kimberly Martinique, [place]   Laboratory & Radiologic Studies: None

## 2022-08-14 ENCOUNTER — Telehealth: Payer: Self-pay

## 2022-08-14 NOTE — Telephone Encounter (Signed)
LM for [name] stating that [name] stated that it would be fine to apply Vaseline ointment to surgical wound prior to urination for a barrier to help decrease burning. Call back to the office if she has any other questions or concerns.

## 2022-08-18 ENCOUNTER — Telehealth: Payer: Self-pay | Admitting: Obstetrics and Gynecology

## 2022-08-18 NOTE — Telephone Encounter (Signed)
Please contact patient in follow up to our plan for her to have a pelvic US to recheck her uterine fibroid, which was a change from previous imaging.   I see that she is under the care of GYN ON[facility] for healing following treatment for vulvar dysplasia, and I recommend that we do her ultrasound in 3 months.   Please schedule the appointment for pelvic ultrasound and follow up with me for May, 2024.

## 2022-08-19 ENCOUNTER — Inpatient Hospital Stay (HOSPITAL_BASED_OUTPATIENT_CLINIC_OR_DEPARTMENT_OTHER): Payer: 59 | Admitting: Psychiatry

## 2022-08-19 ENCOUNTER — Other Ambulatory Visit: Payer: Self-pay

## 2022-08-19 ENCOUNTER — Encounter: Payer: Self-pay | Admitting: Psychiatry

## 2022-08-19 VITALS — BP 112/68 | HR 82 | Temp 98.4°F | Resp 16 | Wt 186.0 lb

## 2022-08-19 DIAGNOSIS — Z9079 Acquired absence of other genital organ(s): Secondary | ICD-10-CM

## 2022-08-19 DIAGNOSIS — D069 Carcinoma in situ of cervix, unspecified: Secondary | ICD-10-CM

## 2022-08-19 DIAGNOSIS — S31829D Unspecified open wound of left buttock, subsequent encounter: Secondary | ICD-10-CM

## 2022-08-19 NOTE — Telephone Encounter (Signed)
Thank you for the update.  You may close this encounter.

## 2022-08-19 NOTE — Telephone Encounter (Addendum)
Spoke with patient and informed her. She is agreeable to May 2024. U/s Appt scheduled for 11/07/2022.  She wanted me to let Dr. Quincy Simmonds know that Dr. Ernestina Patches has referred her to [facility] specialist for vulvar pain associated with her surgery. Also, was prescribed Gabapentin for the nerve pain in that area.

## 2022-08-19 NOTE — Progress Notes (Signed)
Gynecologic Oncology Return Clinic Visit  Date of Service: 08/19/2022 Referring Provider: Nunzio Cobbs, MD   Assessment & Plan: Jennifer Velez is a 57 y.o. 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 Now s/p wound incision and debridement on 07/30/22 with wound packed with WTD dressing, changing BID.  Postop wound complication: - Wound evaluated with healthy granulation tissue.  Wound continues to lessen in size and may only need packing for another week.  - Continue BID WTD dressing changes. - Okay to take 2 tabs of tramadol for dressing change. - Continue with prescription strength lidocaine topically. - Recommend resuming sitz bath's daily to help with potential healing over of granulated area.  Patient desired to avoid silver nitrate treatment today.  Vulvar pain: - Significant pain to light touch particularly over perineal body consistent with paresthesia. -Currently taking gabapentin 300 mg at night.  We discussed we could try 100 mg during the daytime BID to see if better tolerated in addition to 327m nightly  However she would like to try to first add one 300 mg tablet during the daytime given that she feels like she is tolerating the nighttime dose better at this time. - [facility]. May benefit from nerve blcok.   VIN3: - Recommend close surveillance with q640moxams x 5y25yrRTC approximately 1 week.  MerBernadene BellD Gynecologic Oncology   Medical Decision Making I personally spent  TOTAL 20 minutes face-to-face and non-face-to-face in the care of this patient, which includes all pre, intra, and post visit time on the date of service.   ----------------------- Reason for Visit: Wound check, vulvar pain  Treatment history: Patient noted on vulva at time of annual exam on  03/13/2022. 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  9 colposcopy on 04/04/2022 noted raised area on the right perineum and new biopsy obtained which showed VIN 3. Underwent simple partial vulvectomy with rhomboid rotational flap and CO2 laser ablation on 06/04/2022.  Final pathology showing VIN 3, negative margins. Postop course complicated by wound break requiring antibiotics x2 followed by incision and debridement of left gluteal incision from flap (07/30/22), packed WTD to close by secondary intention.  Interval History: Patient reports that she was able to pick up the gabapentin.  She is taking 300 mg at night.  However, when she took the gabapentin during the day it made her drowsy.  She does feel like she is tolerating the gabapentin more at this time and so she may revisit reintroducing some during the daytime.  [facility] but missed it.  She plans to call back.  Otherwise her husband reports that the packing is getting less and less and the wound is closing.  Patient reports ongoing pain in the midline perineal area which is exacerbated with urination.  She has been using topical lidocaine as well as now Vaseline around the area which she thinks is helping.   Past Medical/Surgical History: Past Medical History:  Diagnosis Date   CIN III (cervical intraepithelial neoplasia III)    Complication of anesthesia    Endometriosis    Fibroid    GERD (gastroesophageal reflux disease)    IBS (irritable bowel syndrome)    Osteoporosis 05/2017   T score -2.0 prior DEXA 2016 T score -2.8 AP spine  Past Surgical History:  Procedure Laterality Date   CERVICAL BIOPSY  W/ LOOP ELECTRODE EXCISION  [id number]   CO2 LASER APPLICATION N/A [id number]   Procedure: CO2 LASER APPLICATION;  Surgeon: [name], MD;  [facility]: [facility];  Service: Gynecology;  Laterality: N/A;   COLPOSCOPY     EXPLORATORY LAPAROTOMY  2004   myomectomy and excision endometriosis   INCISION AND  DRAINAGE ABSCESS N/A 07/30/2022   Procedure: INCISION AND DRAINAGE HEMATOMA OF VULVA WITH PACKING;  Surgeon: [name], MD;  Location: [place];  Service: Gynecology;  Laterality: N/A;   PELVIC LAPAROSCOPY  1992   x2, for endometriosis   SKIN CANCER EXCISION     Squamous cell   VULVECTOMY N/A 06/04/2022   Procedure: WIDE EXCISION VULVECTOMY;  Surgeon: [name], MD;  [facility]: [facility];  Service: Gynecology;  Laterality: N/A;    Family History  Problem Relation Age of Onset   Diverticulitis Mother    Alzheimer's disease Mother    Alzheimer's disease Maternal Grandmother    Alzheimer's disease Maternal Aunt    Alzheimer's disease Maternal Aunt    Ulcerative colitis Maternal Aunt    Ulcerative colitis Cousin    Colon cancer Neg Hx    Esophageal cancer Neg Hx    Rectal cancer Neg Hx    Stomach cancer Neg Hx    Colon polyps Neg Hx    Breast cancer Neg Hx    Ovarian cancer Neg Hx    Endometrial cancer Neg Hx     Social History   Socioeconomic History   Marital status: Married    Spouse name: Not on file   Number of children: Not on file   Years of education: Not on file   Highest education level: Not on file  Occupational History   Occupation: Home maker  Tobacco Use   Smoking status: Never   Smokeless tobacco: Never  Vaping Use   Vaping Use: Never used  Substance and Sexual Activity   Alcohol use: No    Alcohol/week: 0.0 standard drinks of alcohol   Drug use: No   Sexual activity: Not Currently    Birth control/protection: Post-menopausal    Comment: 1st intercourse 67 yo-5 partners  Other Topics Concern   Not on file  Social History Narrative   3 adopted children, 2 boys and 1 girl   Social Determinants of Radio broadcast assistant Strain: Not on file  Food Insecurity: Not on file  Transportation Needs: Not on file  Physical Activity: Not on file  Stress: Not on file  Social Connections: Not on file    Current  Medications:  Current Outpatient Medications:    ALPRAZolam (XANAX) 0.5 MG tablet, Take 1 tablet as needed prior to procedure., Disp: 1 tablet, Rfl: 0   Ascorbic Acid (VITAMIN C PO), Take 1,000 mg by mouth in the morning., Disp: , Rfl:    Cholecalciferol (VITAMIN D) 50 MCG (2000 UT) tablet, Take 2,000 Units by mouth in the morning., Disp: , Rfl:    fluorouracil (EFUDEX) 5 % cream, Apply 1 Application topically daily as needed (skin spots)., Disp: , Rfl:    gabapentin (NEURONTIN) 300 MG capsule, Take 1 capsule (300 mg total) by mouth 3 (three) times daily., Disp: 90 capsule, Rfl: 3   ibuprofen (ADVIL) 800 MG tablet, Take 1 tablet (800 mg total) by mouth every 8 (eight) hours as needed for moderate pain., Disp: 30 tablet, Rfl: 1   lidocaine (XYLOCAINE) 5 % ointment, Apply  1 Application topically 3 (three) times daily as needed for moderate pain (to the vulva)., Disp: 35.44 g, Rfl: 1   senna-docusate (SENOKOT-S) 8.6-50 MG tablet, Take 2 tablets by mouth at bedtime. For AFTER surgery, do not take if having diarrhea, Disp: 30 tablet, Rfl: 0   traMADol (ULTRAM) 50 MG tablet, Take 1 tablet (50 mg total) by mouth every 6 (six) hours as needed for severe pain., Disp: 30 tablet, Rfl: 0  Review of Symptoms: Complete 10-system review is positive for: 1 day of diarrhea, burning on the outside with urination  Physical Exam: BP 112/68 (BP Location: Right Leg, Patient Position: Standing)   Pulse 82   Temp 98.4 F (36.9 C) (Oral)   Resp 16   Wt 186 lb (84.4 kg)   LMP 11/02/2012   SpO2 97%   BMI 29.13 kg/m  General: Alert, oriented, not in acute distress HEENT: Normocephalic, atraumatic. Neck symmetric without masses.  Chest: Normal work of breathing.  Extremities: Grossly normal range of motion.  Warm, well perfused.  No edema bilaterally. Skin: No rashes or lesions noted. GU: External genitalia with perineal body in midline with continued granulation tissue, but appears to be decreasing in size from  prior.   Wound examined and healing well with healthy granulation tissue.  Continuing to decrease in size and depth with midline portion almost nearly healed over.  Wound irrigated and repacked. Exam chaperoned by Joylene John, NP   Laboratory & Radiologic Studies: None

## 2022-08-19 NOTE — Patient Instructions (Signed)
It was a pleasure to see you in clinic today. - Give the [facility] clinic a call back to schedule. - Let us know how the daytime gabapentin goes in case we need to reduce the dose. - Start sitz baths again. - Return visit planned for 1 week  Thank you very much for allowing me to provide care for you today.  I appreciate your confidence in choosing our Gynecologic Oncology team at [facility].  If you have any questions about your visit today please call our office or send [place] a MyChart message and we will get back to you as soon as possible.

## 2022-08-21 ENCOUNTER — Telehealth: Payer: Self-pay

## 2022-08-21 NOTE — Telephone Encounter (Signed)
Jennifer Velez called office stating she received a call from [facility]. She was told their next available opening for her to be seen is not until June, they do have her on a cancellation list but she states this will be well past insurance coverage. She has an appointment on Monday 2/26 with Dr. Ernestina Patches and states a plan can be discussed at that time.  Dr. Ernestina Patches notified

## 2022-08-26 ENCOUNTER — Other Ambulatory Visit: Payer: Self-pay

## 2022-08-26 ENCOUNTER — Ambulatory Visit: Payer: 59 | Admitting: Psychiatry

## 2022-08-26 ENCOUNTER — Inpatient Hospital Stay (HOSPITAL_BASED_OUTPATIENT_CLINIC_OR_DEPARTMENT_OTHER): Payer: 59 | Admitting: Psychiatry

## 2022-08-26 ENCOUNTER — Encounter: Payer: Self-pay | Admitting: Psychiatry

## 2022-08-26 VITALS — BP 115/71 | HR 85 | Temp 98.2°F | Resp 18 | Ht 67.75 in | Wt 186.0 lb

## 2022-08-26 DIAGNOSIS — Z9079 Acquired absence of other genital organ(s): Secondary | ICD-10-CM

## 2022-08-26 DIAGNOSIS — D071 Carcinoma in situ of vulva: Secondary | ICD-10-CM

## 2022-08-26 DIAGNOSIS — D069 Carcinoma in situ of cervix, unspecified: Secondary | ICD-10-CM

## 2022-08-26 DIAGNOSIS — B3731 Acute candidiasis of vulva and vagina: Secondary | ICD-10-CM

## 2022-08-26 MED ORDER — FLUCONAZOLE 150 MG PO TABS: ORAL_TABLET | ORAL | 0 refills | Status: DC

## 2022-08-26 MED ORDER — ESTRADIOL 0.1 MG/GM VA CREA: TOPICAL_CREAM | VAGINAL | 3 refills | Status: DC

## 2022-08-26 NOTE — Patient Instructions (Signed)
It was a pleasure to see you in clinic today. - You can stop packing - Continue with sitz baths and gabapentin - Start estrogen cream nightly for 2 weeks then two nights a week - One dose of diflucan for possible yeast infection. - Try to get an appointment scheduled at [facility] in case - Return visit planned for 2 weeks  Thank you very much for allowing me to provide care for you today.  I appreciate your confidence in choosing our Gynecologic Oncology team at [facility].  If you have any questions about your visit today please call our office or send [place] a MyChart message and we will get back to you as soon as possible.

## 2022-08-26 NOTE — Progress Notes (Signed)
Gynecologic Oncology Return Clinic Visit  Date of Service: 08/26/2022 Referring Provider: Nunzio Cobbs, MD   Assessment & Plan: Jennifer Velez is a 57 y.o. 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 Now s/p wound incision and debridement on 07/30/22 with wound packed with WTD dressing, changing BID.  Postop wound complication: - Wound evaluated with healthy granulation tissue.  Very superficial opening at this point. Can discontinue packing. - Continue daily sitz baths. - Discussed consideration of additional silver nitrate treatment given polypoid raised area of granulation. Pt will continue with sitz baths at this time and defer silver nitrate and consider at a future visit if needed. - Recommend topical estrogen to introitus. Nightly x2 weeks and then twice weekly. Rx sent.  Vaginovulvar yeast: - Rx for diflucan.  Vulvar pain: - Significant pain to light touch particularly over perineal body consistent with paresthesia. - Continue gabapentin '300mg'$  BID. She will trial TID soon and see if she tolerates. - Encouraged pt to schedule with [facility] even if it isn't until June. Possible that [employer] coverage will be ongoing, although currently set to drop April 1. She will continue to call with the wait list to see if sooner appointment available.   VIN3: - Recommend close surveillance with q64moexams x 559yr RTC in 2 weeks.  [name] Gynecologic Oncology   Medical Decision Making I personally spent  TOTAL 20 minutes face-to-face and non-face-to-face in the care of this patient, which includes all pre, intra, and post visit time on the date of service.   ----------------------- Reason for Visit: Wound check, vulvar pain  Treatment history: Patient noted on vulva at time of annual exam on 03/13/2022. Biopsy  on 03/20/2022 showed condyloma.  9 colposcopy on 04/04/2022 noted raised area on the right perineum and new biopsy obtained which showed VIN 3. Underwent simple partial vulvectomy with rhomboid rotational flap and CO2 laser ablation on 06/04/2022.  Final pathology showing VIN 3, negative margins. Postop course complicated by wound break requiring antibiotics x2 followed by incision and debridement of left gluteal incision from flap (07/30/22), packed WTD to close by secondary intention.  Interval History: Patient has increased her gabapentin to 300 mg in the morning and 300 mg at night.  She tried to take a midday dose yesterday but did not tolerate it.  She otherwise is tolerating the twice daily dosing so far.  Her husband reports that the packing is minimal at this point.  Patient notes still some significant pain in the midline near where the granulation tissue meets her incision from the I&D.  [facility] so she did not make an appointment but requested to be on a cancellation list.   Past Medical/Surgical History: Past Medical History:  Diagnosis Date   CIN III (cervical intraepithelial neoplasia III)    Complication of anesthesia    Endometriosis    Fibroid    GERD (gastroesophageal reflux disease)    IBS (irritable bowel syndrome)    Osteoporosis 05/2017   T score -2.0 prior DEXA 2016 T score -2.8 AP spine    Past Surgical History:  Procedure Laterality Date   CERVICAL BIOPSY  W/ LOOP ELECTRODE EXCISION  [id number] CO2 LASER APPLICATION N/A [id number] Procedure: CO2 LASER APPLICATION;  Surgeon: NeBernadene Bell  MD;  Location: [facility];  Service: Gynecology;  Laterality: N/A;   COLPOSCOPY     EXPLORATORY LAPAROTOMY  2004   myomectomy and excision endometriosis   INCISION AND DRAINAGE ABSCESS N/A 07/30/2022   Procedure: INCISION AND DRAINAGE HEMATOMA OF VULVA WITH PACKING;  Surgeon: [name], MD;  Location: [place];  Service: Gynecology;  Laterality: N/A;   PELVIC LAPAROSCOPY  1992   x2, for endometriosis   SKIN CANCER EXCISION     Squamous cell   VULVECTOMY N/A 06/04/2022   Procedure: WIDE EXCISION VULVECTOMY;  Surgeon: [name], MD;  Location: [facility];  Service: Gynecology;  Laterality: N/A;    Family History  Problem Relation Age of Onset   Diverticulitis Mother    Alzheimer's disease Mother    Alzheimer's disease Maternal Grandmother    Alzheimer's disease Maternal Aunt    Alzheimer's disease Maternal Aunt    Ulcerative colitis Maternal Aunt    Ulcerative colitis Cousin    Colon cancer Neg Hx    Esophageal cancer Neg Hx    Rectal cancer Neg Hx    Stomach cancer Neg Hx    Colon polyps Neg Hx    Breast cancer Neg Hx    Ovarian cancer Neg Hx    Endometrial cancer Neg Hx     Social History   Socioeconomic History   Marital status: Married    Spouse name: Not on file   Number of children: Not on file   Years of education: Not on file   Highest education level: Not on file  Occupational History   Occupation: Home maker  Tobacco Use   Smoking status: Never   Smokeless tobacco: Never  Vaping Use   Vaping Use: Never used  Substance and Sexual Activity   Alcohol use: No    Alcohol/week: 0.0 standard drinks of alcohol   Drug use: No   Sexual activity: Not Currently    Birth control/protection: Post-menopausal    Comment: 1st intercourse 6 yo-5 partners  Other Topics Concern   Not on file  Social History Narrative   3 adopted children, 2 boys and 1 girl   Social Determinants of Radio broadcast assistant Strain: Not on file  Food Insecurity: Not on file  Transportation Needs: Not on file  Physical Activity: Not on file  Stress: Not on file  Social Connections: Not on file    Current Medications:  Current Outpatient Medications:    estradiol (ESTRACE VAGINAL) 0.1 MG/GM vaginal cream, Place a pea size on your fingertip and apply to the opening of  the vagina. Start with nightly for 2 weeks and then twice a week at night there after., Disp: 42.5 g, Rfl: 3   fluconazole (DIFLUCAN) 150 MG tablet, Take one tablet. If persistent symptoms in 48 hours, you can repeat with one additional tablet., Disp: 2 tablet, Rfl: 0   ALPRAZolam (XANAX) 0.5 MG tablet, Take 1 tablet as needed prior to procedure., Disp: 1 tablet, Rfl: 0   Ascorbic Acid (VITAMIN C PO), Take 1,000 mg by mouth in the morning., Disp: , Rfl:    Cholecalciferol (VITAMIN D) 50 MCG (2000 UT) tablet, Take 2,000 Units by mouth in the morning., Disp: , Rfl:    fluorouracil (EFUDEX) 5 % cream, Apply 1 Application topically daily as needed (skin spots)., Disp: , Rfl:    gabapentin (NEURONTIN) 300 MG capsule, Take 1 capsule (300 mg total) by mouth 3 (three) times daily., Disp: 90 capsule, Rfl: 3  ibuprofen (ADVIL) 800 MG tablet, Take 1 tablet (800 mg total) by mouth every 8 (eight) hours as needed for moderate pain., Disp: 30 tablet, Rfl: 1   lidocaine (XYLOCAINE) 5 % ointment, Apply 1 Application topically 3 (three) times daily as needed for moderate pain (to the vulva)., Disp: 35.44 g, Rfl: 1   senna-docusate (SENOKOT-S) 8.6-50 MG tablet, Take 2 tablets by mouth at bedtime. For AFTER surgery, do not take if having diarrhea, Disp: 30 tablet, Rfl: 0   traMADol (ULTRAM) 50 MG tablet, Take 1 tablet (50 mg total) by mouth every 6 (six) hours as needed for severe pain., Disp: 30 tablet, Rfl: 0  Review of Symptoms: Complete 10-system review is positive for: Bloating, diarrhea, some burning and vaginal itching, vaginal discharge  Physical Exam: BP 115/71 (BP Location: Right Arm, Patient Position: Sitting)   Pulse 85   Temp 98.2 F (36.8 C) (Oral)   Resp 18   Ht 5' 7.75" (1.721 m)   Wt 186 lb (84.4 kg)   LMP 11/02/2012   BMI 28.49 kg/m  General: Alert, oriented, not in acute distress HEENT: Normocephalic, atraumatic. Neck symmetric without masses.  Chest: Normal work of breathing.   Extremities: Grossly normal range of motion.  Warm, well perfused.  No edema bilaterally. Skin: No rashes or lesions noted. GU: External genitalia with perineal body in midline with continued granulation tissue, but appears to be decreasing in size from prior. One area, approximately 78m, that is more raised and polypoid like in nature, at the area of granulation where it meets her healed I&D incision. Thick yellow/white discharge in labial folds. I&D wound healing well with healthy granulation tissue, nearly completely closed.  Exam chaperoned by Kimberly JMartinique CYucca  Laboratory & Radiologic Studies: None

## 2022-09-09 ENCOUNTER — Inpatient Hospital Stay: Payer: 59 | Attending: Psychiatry | Admitting: Psychiatry

## 2022-09-09 ENCOUNTER — Encounter: Payer: Self-pay | Admitting: Gynecologic Oncology

## 2022-09-09 ENCOUNTER — Encounter: Payer: Self-pay | Admitting: Psychiatry

## 2022-09-09 VITALS — BP 108/42 | HR 88 | Temp 98.1°F | Resp 14 | Wt 193.8 lb

## 2022-09-09 DIAGNOSIS — Z9079 Acquired absence of other genital organ(s): Secondary | ICD-10-CM

## 2022-09-09 DIAGNOSIS — D071 Carcinoma in situ of vulva: Secondary | ICD-10-CM

## 2022-09-09 NOTE — Progress Notes (Signed)
Gynecologic Oncology Return Clinic Visit  Date of Service: 09/09/2022 Referring Provider: Nunzio Cobbs, MD   Assessment & Plan: Jennifer Velez is a 57 y.o. 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 Now s/p wound incision and debridement on 07/30/22 with near complete healing by secondary intention.  Postop wound complication: - Almost complete healing except for one small area of polypoid granulation tissue. Again reviewed consideration of silver nitrate which patient would like to avoid. - Also discussed that if this area persists, we would need to biopsy to ensure this is not dysplasia. - Continue sitz baths but can reduce to as needed or a 2-3x per week. - Continue topical estrogen. Can reduce to 2-3x per week.  Vulvar pain: - Significant pain to light touch particularly over perineal body consistent with paresthesia; improving. - Continue gabapentin '300mg'$  TID. Tolerating - Encouraged pt to schedule with [facility] even if it isn't until June. Possible that [employer] coverage will be ongoing, although currently set to drop April 1. She will considering calling to get something scheduled.   VIN3: - Recommend close surveillance with q80moexams x 538yr RTC in 1 mo.  [name] Gynecologic Oncology   Medical Decision Making I personally spent  TOTAL 20 minutes face-to-face and non-face-to-face in the care of this patient, which includes all pre, intra, and post visit time on the date of service.   ----------------------- Reason for Visit: Wound check, vulvar pain  Treatment history: Patient noted on vulva at time of annual exam on 03/13/2022. 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  9 colposcopy on 04/04/2022 noted raised area on the right perineum and new biopsy obtained which showed VIN 3. Underwent simple  partial vulvectomy with rhomboid rotational flap and CO2 laser ablation on 06/04/2022.  Final pathology showing VIN 3, negative margins. Postop course complicated by wound break requiring antibiotics x2 followed by incision and debridement of left gluteal incision from flap (07/30/22), packed WTD to close by secondary intention.  Interval History: Patient presents today with her husband.  She reports that she is now up to 3 mg 3 times daily of gabapentin and overall tolerating it well.  She continues to apply estrogen to outside the vulva nightly as well as Vaseline in the area.  She took the dose of Diflucan with improvement in some of the burning itching.  Overall she and her husband think that she is improving somewhat.  She is able to sit more on her bottom if it is softer she has her doughnut.  [facility]    Past Medical/Surgical History: Past Medical History:  Diagnosis Date   CIN III (cervical intraepithelial neoplasia III)    Complication of anesthesia    Endometriosis    Fibroid    GERD (gastroesophageal reflux disease)    IBS (irritable bowel syndrome)    Osteoporosis 05/2017   T score -2.0 prior DEXA 2016 T score -2.8 AP spine    Past Surgical History:  Procedure Laterality Date   CERVICAL BIOPSY  W/ LOOP ELECTRODE EXCISION  [id number] CO2 LASER APPLICATION N/A [id number] Procedure: CO2 LASER APPLICATION;  Surgeon: NeBernadene BellMD;  [facility]: [facility];  Service: Gynecology;  Laterality: N/A;   COLPOSCOPY     EXPLORATORY LAPAROTOMY  2004   myomectomy and excision  endometriosis   INCISION AND DRAINAGE ABSCESS N/A 07/30/2022   Procedure: INCISION AND DRAINAGE HEMATOMA OF VULVA WITH PACKING;  Surgeon: [name], MD;  Location: [place];  Service: Gynecology;  Laterality: N/A;   PELVIC LAPAROSCOPY  1992   x2, for endometriosis   SKIN CANCER EXCISION     Squamous cell   VULVECTOMY N/A 06/04/2022   Procedure: WIDE  EXCISION VULVECTOMY;  Surgeon: [name], MD;  [facility];  Service: Gynecology;  Laterality: N/A;    Family History  Problem Relation Age of Onset   Diverticulitis Mother    Alzheimer's disease Mother    Alzheimer's disease Maternal Grandmother    Alzheimer's disease Maternal Aunt    Alzheimer's disease Maternal Aunt    Ulcerative colitis Maternal Aunt    Ulcerative colitis Cousin    Colon cancer Neg Hx    Esophageal cancer Neg Hx    Rectal cancer Neg Hx    Stomach cancer Neg Hx    Colon polyps Neg Hx    Breast cancer Neg Hx    Ovarian cancer Neg Hx    Endometrial cancer Neg Hx     Social History   Socioeconomic History   Marital status: Married    Spouse name: Not on file   Number of children: Not on file   Years of education: Not on file   Highest education level: Not on file  Occupational History   Occupation: Home maker  Tobacco Use   Smoking status: Never   Smokeless tobacco: Never  Vaping Use   Vaping Use: Never used  Substance and Sexual Activity   Alcohol use: No    Alcohol/week: 0.0 standard drinks of alcohol   Drug use: No   Sexual activity: Not Currently    Birth control/protection: Post-menopausal    Comment: 1st intercourse 61 yo-5 partners  Other Topics Concern   Not on file  Social History Narrative   3 adopted children, 2 boys and 1 girl   Social Determinants of Radio broadcast assistant Strain: Not on file  Food Insecurity: Not on file  Transportation Needs: Not on file  Physical Activity: Not on file  Stress: Not on file  Social Connections: Not on file    Current Medications:  Current Outpatient Medications:    ALPRAZolam (XANAX) 0.5 MG tablet, Take 1 tablet as needed prior to procedure., Disp: 1 tablet, Rfl: 0   Ascorbic Acid (VITAMIN C PO), Take 1,000 mg by mouth in the morning., Disp: , Rfl:    Cholecalciferol (VITAMIN D) 50 MCG (2000 UT) tablet, Take 2,000 Units by mouth in the morning., Disp: , Rfl:    estradiol  (ESTRACE VAGINAL) 0.1 MG/GM vaginal cream, Place a pea size on your fingertip and apply to the opening of the vagina. Start with nightly for 2 weeks and then twice a week at night there after., Disp: 42.5 g, Rfl: 3   fluconazole (DIFLUCAN) 150 MG tablet, Take one tablet. If persistent symptoms in 48 hours, you can repeat with one additional tablet., Disp: 2 tablet, Rfl: 0   fluorouracil (EFUDEX) 5 % cream, Apply 1 Application topically daily as needed (skin spots)., Disp: , Rfl:    gabapentin (NEURONTIN) 300 MG capsule, Take 1 capsule (300 mg total) by mouth 3 (three) times daily., Disp: 90 capsule, Rfl: 3   ibuprofen (ADVIL) 800 MG tablet, Take 1 tablet (800 mg total) by mouth every 8 (eight) hours as needed for moderate pain., Disp: 30 tablet,  Rfl: 1   lidocaine (XYLOCAINE) 5 % ointment, Apply 1 Application topically 3 (three) times daily as needed for moderate pain (to the vulva)., Disp: 35.44 g, Rfl: 1   senna-docusate (SENOKOT-S) 8.6-50 MG tablet, Take 2 tablets by mouth at bedtime. For AFTER surgery, do not take if having diarrhea, Disp: 30 tablet, Rfl: 0   traMADol (ULTRAM) 50 MG tablet, Take 1 tablet (50 mg total) by mouth every 6 (six) hours as needed for severe pain., Disp: 30 tablet, Rfl: 0  Review of Symptoms: Complete 10-system review is positive for: Bloating, diarrhea, some burning and vaginal itching, vaginal discharge  Physical Exam: BP (!) 108/42 (BP Location: Left Arm, Patient Position: Standing) Comment: informed Dr Ernestina Patches about low number  Pulse 88   Temp 98.1 F (36.7 C) (Oral)   Resp 14   Wt 193 lb 12.8 oz (87.9 kg)   LMP 11/02/2012   SpO2 96%   BMI 29.69 kg/m  General: Alert, oriented, not in acute distress HEENT: Normocephalic, atraumatic. Neck symmetric without masses.  Chest: Normal work of breathing.  Extremities: Grossly normal range of motion.  Warm, well perfused.  No edema bilaterally. Skin: No rashes or lesions noted. GU: External genitalia with perineal  body in midline with almost complete healing. Approximately 5x66m polypoid area to the left of midline that is still present. Otherwise, gluteal incision with fold of scar but complete healing Exam chaperoned by MJoylene John NP   Laboratory & Radiologic Studies: None

## 2022-09-09 NOTE — Patient Instructions (Signed)
Plan to continue use of estrogen cream 2-3 nights a week.  Plan to follow up in one month or sooner if needed.  If the raised area on the left closest to the vaginal opening is still present at this visit, you may have a biopsy of this.

## 2022-10-14 ENCOUNTER — Inpatient Hospital Stay: Payer: 59 | Attending: Psychiatry | Admitting: Psychiatry

## 2022-10-14 ENCOUNTER — Other Ambulatory Visit: Payer: Self-pay

## 2022-10-14 VITALS — BP 112/53 | HR 67 | Temp 98.3°F | Resp 16 | Wt 186.2 lb

## 2022-10-14 DIAGNOSIS — N94819 Vulvodynia, unspecified: Secondary | ICD-10-CM | POA: Diagnosis not present

## 2022-10-14 DIAGNOSIS — Z9079 Acquired absence of other genital organ(s): Secondary | ICD-10-CM | POA: Diagnosis not present

## 2022-10-14 DIAGNOSIS — D071 Carcinoma in situ of vulva: Secondary | ICD-10-CM | POA: Diagnosis present

## 2022-10-14 NOTE — Progress Notes (Unsigned)
Gynecologic Oncology Return Clinic Visit  Date of Service: 10/14/2022 Referring Provider: Patton Salles, MD   Assessment & Plan: Jennifer Velez is a 57 y.o. 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 S/p wound incision and debridement on 07/30/22, now with complete healing by secondary intention.  Wound healed by secondary intention: -Okay to discontinue sitz bath's at this time.  Patient reports some relief with doing these so she can continue as desired for symptom management. - Continue topical estrogen. Can reduce to 2x per week. - Can continue with Vaseline as desired for patient comfort.  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 - Patient has discontinued gabapentin as she felt that it did not help her pain but made her feel funny.  Okay to stay off of this at this time. - Continue with measures as above.  VIN3: - Recommend close surveillance with q54mo exams x 68yrs - Follow-up in 2 months which will be 6 months postop from the original surgery.  RTC in 2 mo.  Clide Cliff, MD Gynecologic Oncology   Medical Decision Making I personally spent  TOTAL 20 minutes face-to-face and non-face-to-face in the care of this patient, which includes all pre, intra, and post visit time on the date of service.   ----------------------- Reason for Visit: Wound check, vulvar pain  Treatment history: Patient noted on vulva at time of annual exam on 03/13/2022. 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  9 colposcopy on 04/04/2022 noted raised area on the right perineum and new biopsy obtained which showed VIN 3. Underwent simple partial vulvectomy with rhomboid rotational flap and CO2 laser ablation on 06/04/2022.  Final pathology showing VIN 3, negative margins. Postop course complicated by wound break requiring antibiotics x2 followed by  incision and debridement of left gluteal incision from flap (07/30/22), packed WTD to close by secondary intention.  Interval History: Pt presents today with her husband. Reports that she recently had norovirus along with other members in her family except for her husband.  Apart from that, she believes that her vulva has been improving and her husband agrees.  They have continued with the vaginal estrogen, Vaseline, and sitz bath's.  She still feels that the tissue feels scratchy sometimes in this area.  She had stopped the gabapentin when she was sick with norovirus and feels better off of the medication.   Past Medical/Surgical History: Past Medical History:  Diagnosis Date   CIN III (cervical intraepithelial neoplasia III)    Complication of anesthesia    Endometriosis    Fibroid    GERD (gastroesophageal reflux disease)    IBS (irritable bowel syndrome)    Osteoporosis 05/2017   T score -2.0 prior DEXA 2016 T score -2.8 AP spine    Past Surgical History:  Procedure Laterality Date   CERVICAL BIOPSY  W/ LOOP ELECTRODE EXCISION  1999   CO2 LASER APPLICATION N/A 06/04/2022   Procedure: CO2 LASER APPLICATION;  Surgeon: Clide Cliff, MD;  Location: [facility];  Service: Gynecology;  Laterality: N/A;   COLPOSCOPY     EXPLORATORY LAPAROTOMY  2004   myomectomy and excision endometriosis   INCISION AND DRAINAGE ABSCESS N/A 07/30/2022   Procedure: INCISION AND DRAINAGE HEMATOMA OF VULVA WITH PACKING;  Surgeon: Clide Cliff, MD;  Location: [facility];  Service: Gynecology;  Laterality: N/A;   PELVIC LAPAROSCOPY  1992  x2, for endometriosis   SKIN CANCER EXCISION     Squamous cell   VULVECTOMY N/A 06/04/2022   Procedure: WIDE EXCISION VULVECTOMY;  Surgeon: Clide Cliff, MD;  Location: [facility];  Service: Gynecology;  Laterality: N/A;    Family History  Problem Relation Age of Onset   Diverticulitis Mother    Alzheimer's disease Mother    Alzheimer's disease  Maternal Grandmother    Alzheimer's disease Maternal Aunt    Alzheimer's disease Maternal Aunt    Ulcerative colitis Maternal Aunt    Ulcerative colitis Cousin    Colon cancer Neg Hx    Esophageal cancer Neg Hx    Rectal cancer Neg Hx    Stomach cancer Neg Hx    Colon polyps Neg Hx    Breast cancer Neg Hx    Ovarian cancer Neg Hx    Endometrial cancer Neg Hx     Social History   Socioeconomic History   Marital status: Married    Spouse name: Not on file   Number of children: Not on file   Years of education: Not on file   Highest education level: Not on file  Occupational History   Occupation: Home maker  Tobacco Use   Smoking status: Never   Smokeless tobacco: Never  Vaping Use   Vaping Use: Never used  Substance and Sexual Activity   Alcohol use: No    Alcohol/week: 0.0 standard drinks of alcohol   Drug use: No   Sexual activity: Not Currently    Birth control/protection: Post-menopausal    Comment: 1st intercourse 12 yo-5 partners  Other Topics Concern   Not on file  Social History Narrative   3 adopted children, 2 boys and 1 girl   Social Determinants of Corporate investment banker Strain: Not on file  Food Insecurity: Not on file  Transportation Needs: Not on file  Physical Activity: Not on file  Stress: Not on file  Social Connections: Not on file    Current Medications:  Current Outpatient Medications:    Ascorbic Acid (VITAMIN C PO), Take 1,000 mg by mouth in the morning., Disp: , Rfl:    Cholecalciferol (VITAMIN D) 50 MCG (2000 UT) tablet, Take 2,000 Units by mouth in the morning., Disp: , Rfl:    estradiol (ESTRACE VAGINAL) 0.1 MG/GM vaginal cream, Place a pea size on your fingertip and apply to the opening of the vagina. Start with nightly for 2 weeks and then twice a week at night there after., Disp: 42.5 g, Rfl: 3   fluorouracil (EFUDEX) 5 % cream, Apply 1 Application topically daily as needed (skin spots)., Disp: , Rfl:    ibuprofen (ADVIL) 800  MG tablet, Take 1 tablet (800 mg total) by mouth every 8 (eight) hours as needed for moderate pain., Disp: 30 tablet, Rfl: 1   lidocaine (XYLOCAINE) 5 % ointment, Apply 1 Application topically 3 (three) times daily as needed for moderate pain (to the vulva)., Disp: 35.44 g, Rfl: 1  Review of Symptoms: Complete 10-system review is negative except for as per HPI above.  Physical Exam: BP (!) 112/53 (BP Location: Left Arm, Patient Position: Sitting) Comment: nurse notified  Pulse 67   Temp 98.3 F (36.8 C) (Oral)   Resp 16   Wt 186 lb 3.2 oz (84.5 kg)   LMP 11/02/2012   SpO2 97%   BMI 28.52 kg/m  General: Alert, oriented, not in acute distress HEENT: Normocephalic, atraumatic. Neck symmetric without masses.  Chest: Normal work  of breathing.  Extremities: Grossly normal range of motion.  Warm, well perfused.  No edema bilaterally. Skin: No rashes or lesions noted. GU: External genitalia with perineal body in midline now completely healed.  No residual granulation tissue in this area. Gluteal incision with fold of scar but complete healing. Exam chaperoned by Kimberly [place], CMA    Laboratory & Radiologic Studies: None

## 2022-10-14 NOTE — Patient Instructions (Signed)
It was a pleasure to see you in clinic today. - Continue the estrogen twice a week - Sitz baths and the vaseline to your comfort. - Return visit planned for 2 months  Thank you very much for allowing me to provide care for you today.  I appreciate your confidence in choosing our Gynecologic Oncology team at [facility].  If you have any questions about your visit today please call our office or send [place] a MyChart message and we will get back to you as soon as possible.

## 2022-10-16 ENCOUNTER — Encounter: Payer: Self-pay | Admitting: Psychiatry

## 2022-10-25 NOTE — Progress Notes (Signed)
GYNECOLOGY  VISIT   HPI: 57 y.o.   Married  Caucasian  female   G1P0010 with Patient's last menstrual period was 11/02/2012.   here for   U/S consult to recheck uterine fibroid.  Pelvic US - 04/16/22 Uterus 7.55 x 5.98 x 5.09 cm.  Fibroid 4.3 x 3.7 x 4.3 cm. Anterior fundal, partially submucosal versus intramural fibroid.  EMS 6.68 mm.  Left ovary 2.32 x 1.55 x 1.63 cm.  Right ovary 2.50 x 1.98 x 1.26 cm.  Right ovarian follicle 9.1 mm.  No adnexal masses.  No free fluid.  Patient has hx of uterine fibroids.   Her [place] in 2018 showed 2 small fibroid, 22 mm and 23 mm.  She had a myomectomy in 2004.   Patient has been treated for VIN III by Dr. Alvester Morin from [facility]. She had a wide excision vulvectomy and had a post op hematoma and necrotic tissue requiring incision and debridement of the wound and then healing by secondary intention.  Using vaginal estrogen twice a week and Vaseline.  Next appointment with Dr. Alvester Morin is in June.   No vaginal bleeding.   GYNECOLOGIC HISTORY: Patient's last menstrual period was 11/02/2012. Contraception:  PMP Menopausal hormone therapy:  n/a Last mammogram:  06/21/22 Breast Density Cat C, BI-RADS CAT 1 neg Last pap smear:   06-27-21 Neg:Neg HR HPV, 08-11-17 neg, 05-14-16 neg HPV HR neg, 05-02-15 neg         OB History     Gravida  1   Para  0   Term  0   Preterm  0   AB  1   Living  0      SAB  1   IAB      Ectopic      Multiple      Live Births                 Patient Active Problem List   Diagnosis Date Noted   Hematoma of vulva 07/30/2022   Wound, open, buttock 07/30/2022   Vulvar intraepithelial neoplasia (VIN) grade 3 06/04/2022   Acute non-recurrent pansinusitis 05/27/2022   IBS (irritable bowel syndrome)    CIN III (cervical intraepithelial neoplasia III)    DIVERTICULOSIS OF COLON 06/19/2007   Rectal bleeding 06/19/2007   ENDOMETRIOSIS 06/19/2007    Past Medical History:  Diagnosis Date   CIN III  (cervical intraepithelial neoplasia III)    Complication of anesthesia    Endometriosis    Fibroid    GERD (gastroesophageal reflux disease)    IBS (irritable bowel syndrome)    Osteoporosis 05/2017   T score -2.0 prior DEXA 2016 T score -2.8 AP spine    Past Surgical History:  Procedure Laterality Date   CERVICAL BIOPSY  W/ LOOP ELECTRODE EXCISION  1999   CO2 LASER APPLICATION N/A 06/04/2022   Procedure: CO2 LASER APPLICATION;  Surgeon: Clide Cliff, MD;  Location: [facility];  Service: Gynecology;  Laterality: N/A;   COLPOSCOPY     EXPLORATORY LAPAROTOMY  2004   myomectomy and excision endometriosis   INCISION AND DRAINAGE ABSCESS N/A 07/30/2022   Procedure: INCISION AND DRAINAGE HEMATOMA OF VULVA WITH PACKING;  Surgeon: Clide Cliff, MD;  Location: [facility];  Service: Gynecology;  Laterality: N/A;   PELVIC LAPAROSCOPY  1992   x2, for endometriosis   SKIN CANCER EXCISION     Squamous cell   VULVECTOMY N/A 06/04/2022   Procedure: WIDE EXCISION VULVECTOMY;  Surgeon: Alvester Morin,  Sharyl Nimrod, MD;  Location: [facility];  Service: Gynecology;  Laterality: N/A;    Current Outpatient Medications  Medication Sig Dispense Refill   Ascorbic Acid (VITAMIN C PO) Take 1,000 mg by mouth in the morning.     Cholecalciferol (VITAMIN D) 50 MCG (2000 UT) tablet Take 2,000 Units by mouth in the morning.     estradiol (ESTRACE VAGINAL) 0.1 MG/GM vaginal cream Place a pea size on your fingertip and apply to the opening of the vagina. Start with nightly for 2 weeks and then twice a week at night there after. 42.5 g 3   fluorouracil (EFUDEX) 5 % cream Apply 1 Application topically daily as needed (skin spots).     ibuprofen (ADVIL) 800 MG tablet Take 1 tablet (800 mg total) by mouth every 8 (eight) hours as needed for moderate pain. 30 tablet 1   lidocaine (XYLOCAINE) 5 % ointment Apply 1 Application topically 3 (three) times daily as needed for moderate pain (to the vulva). 35.44 g 1   No  current facility-administered medications for this visit.     ALLERGIES: Sulfonamide derivatives  Family History  Problem Relation Age of Onset   Diverticulitis Mother    Alzheimer's disease Mother    Alzheimer's disease Maternal Grandmother    Alzheimer's disease Maternal Aunt    Alzheimer's disease Maternal Aunt    Ulcerative colitis Maternal Aunt    Ulcerative colitis Cousin    Colon cancer Neg Hx    Esophageal cancer Neg Hx    Rectal cancer Neg Hx    Stomach cancer Neg Hx    Colon polyps Neg Hx    Breast cancer Neg Hx    Ovarian cancer Neg Hx    Endometrial cancer Neg Hx     Social History   Socioeconomic History   Marital status: Married    Spouse name: Not on file   Number of children: Not on file   Years of education: Not on file   Highest education level: Not on file  Occupational History   Occupation: Home maker  Tobacco Use   Smoking status: Never   Smokeless tobacco: Never  Vaping Use   Vaping Use: Never used  Substance and Sexual Activity   Alcohol use: No    Alcohol/week: 0.0 standard drinks of alcohol   Drug use: No   Sexual activity: Not Currently    Birth control/protection: Post-menopausal    Comment: 1st intercourse 39 yo-5 partners  Other Topics Concern   Not on file  Social History Narrative   3 adopted children, 2 boys and 1 girl   Social Determinants of Corporate investment banker Strain: Not on file  Food Insecurity: Not on file  Transportation Needs: Not on file  Physical Activity: Not on file  Stress: Not on file  Social Connections: Not on file  Intimate Partner Violence: Not on file    Review of Systems  All other systems reviewed and are negative.   PHYSICAL EXAMINATION:    BP 122/82 (BP Location: Right Arm, Patient Position: Sitting, Cuff Size: Normal)   Pulse 64   Ht 5' 7.5" (1.715 m)   Wt 186 lb (84.4 kg)   LMP 11/02/2012   SpO2 97%   BMI 28.70 kg/m     General appearance: alert, cooperative and appears stated  age  Pelvic: External genitalia:  no lesions.  There is a left oblique incision in the perineal/perianal region.  Well healed.  No lesions are noted today.  Urethra:  normal appearing urethra with no masses, tenderness or lesions              Bimanual Exam:  Deferred.   Chaperone was present for exam:  Gwenith Spitz, CMA  Pelvic US  Uterus 7.3 x 5.27 x 5.26 cm.  Intramural fibroid 4.28 cm in largest diameter, intramural and displacing endometrium anteriorly.  EMS 3.72 mm.  No masses. Left ovary 1.91 x 1.24 x 1.32 cm.  Right ovary 1.76 x 1.42 cm x 1.32 cm.  No adnexal masses.  No free fluid.   ASSESSMENT  Uterine fibroid.  Stable.  VIN III.  No lesions noted today.    PLAN  [place] images and report reviewed.  Reassurance regarding uterine fibroid, which is stable.  Follow up will be based on clinical symptoms.   Hysterectomy is not indicated.  She will call for any postmenopausal bleeding.  Continue GYN ONC care for vulvar dysplasia.  She will return for her annual exam in September 2024 and return prn. .ime  36 min  total time was spent for this patient encounter, including preparation, face-to-face counseling with the patient, coordination of care, and documentation of the encounter.

## 2022-11-07 ENCOUNTER — Other Ambulatory Visit: Payer: 59

## 2022-11-07 ENCOUNTER — Ambulatory Visit: Payer: 59 | Admitting: Obstetrics and Gynecology

## 2022-11-07 ENCOUNTER — Encounter: Payer: Self-pay | Admitting: Obstetrics and Gynecology

## 2022-11-07 ENCOUNTER — Other Ambulatory Visit: Payer: 59 | Admitting: Obstetrics and Gynecology

## 2022-11-07 ENCOUNTER — Ambulatory Visit (INDEPENDENT_AMBULATORY_CARE_PROVIDER_SITE_OTHER): Payer: 59

## 2022-11-07 VITALS — BP 122/82 | HR 64 | Ht 67.5 in | Wt 186.0 lb

## 2022-11-07 DIAGNOSIS — D071 Carcinoma in situ of vulva: Secondary | ICD-10-CM

## 2022-11-07 DIAGNOSIS — D219 Benign neoplasm of connective and other soft tissue, unspecified: Secondary | ICD-10-CM | POA: Diagnosis not present

## 2022-11-07 DIAGNOSIS — D251 Intramural leiomyoma of uterus: Secondary | ICD-10-CM

## 2022-11-07 DIAGNOSIS — D250 Submucous leiomyoma of uterus: Secondary | ICD-10-CM | POA: Diagnosis not present

## 2022-12-13 ENCOUNTER — Encounter: Payer: Self-pay | Admitting: Psychiatry

## 2022-12-16 ENCOUNTER — Inpatient Hospital Stay: Payer: 59 | Attending: Psychiatry | Admitting: Psychiatry

## 2022-12-16 ENCOUNTER — Other Ambulatory Visit: Payer: Self-pay

## 2022-12-16 VITALS — BP 101/60 | HR 70 | Temp 97.7°F | Ht 67.32 in | Wt 188.0 lb

## 2022-12-16 DIAGNOSIS — D071 Carcinoma in situ of vulva: Secondary | ICD-10-CM | POA: Insufficient documentation

## 2022-12-16 DIAGNOSIS — Z9079 Acquired absence of other genital organ(s): Secondary | ICD-10-CM | POA: Insufficient documentation

## 2022-12-16 DIAGNOSIS — N94819 Vulvodynia, unspecified: Secondary | ICD-10-CM | POA: Insufficient documentation

## 2022-12-16 NOTE — Progress Notes (Signed)
Gynecologic Oncology Return Clinic Visit  Date of Service: 12/16/2022 Referring Provider: Patton Salles, MD   Assessment & Plan: Jennifer Velez is a 57 y.o. 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 S/p wound incision and debridement on 07/30/22, now with complete healing by secondary intention.  VIN3: - Recommend close surveillance with q72mo exams x 27yrs then annually - Exam NED today. - Signs/symptoms of recurrence reviewed  Vulvodynia: - Pt feels that sitz baths help. She can continue PRN but bottom otherwise well healed. - Pt not sure that the estrogen makes much of a difference but she feels the vaseline helps. Discussed possibly trialing off the estrogen. If no difference off estrogen, okay to discontinue. - Reviewed possible referral to [facility] again as [facility] come to agreement with [employer]. She will consider.  RTC in 6 mo.  Clide Cliff, MD Gynecologic Oncology   Medical Decision Making I personally spent  TOTAL 23 minutes face-to-face and non-face-to-face in the care of this patient, which includes all pre, intra, and post visit time on the date of service.   ----------------------- Reason for Visit: Follow-up VIN  Treatment history: Patient noted on vulva at time of annual exam on 03/13/2022. 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  9 colposcopy on 04/04/2022 noted raised area on the right perineum and new biopsy obtained which showed VIN 3. Underwent simple partial vulvectomy with rhomboid rotational flap and CO2 laser ablation on 06/04/2022.  Final pathology showing VIN 3, negative margins. Postop course complicated by wound break requiring antibiotics x2 followed by incision and debridement of left gluteal incision from flap (07/30/22), packed WTD to close by secondary intention.  Interval History: Pt  presents alone today. She reports that her bottom is sore/tender at times. She uses the donut when sitting on hard surfaces or seated for a long time. Sitz baths continue to help. She is applying estrogen a couple times a week and still uses vaseline. She recently traveled with her family to the [facility].   Past Medical/Surgical History: Past Medical History:  Diagnosis Date   CIN III (cervical intraepithelial neoplasia III)    Complication of anesthesia    Endometriosis    Fibroid    GERD (gastroesophageal reflux disease)    IBS (irritable bowel syndrome)    Osteoporosis 05/2017   T score -2.0 prior DEXA 2016 T score -2.8 AP spine    Past Surgical History:  Procedure Laterality Date   CERVICAL BIOPSY  W/ LOOP ELECTRODE EXCISION  1999   CO2 LASER APPLICATION N/A 06/04/2022   Procedure: CO2 LASER APPLICATION;  Surgeon: Clide Cliff, MD;  Location: [facility];  Service: Gynecology;  Laterality: N/A;   COLPOSCOPY     EXPLORATORY LAPAROTOMY  2004   myomectomy and excision endometriosis   INCISION AND DRAINAGE ABSCESS N/A 07/30/2022   Procedure: INCISION AND DRAINAGE HEMATOMA OF VULVA WITH PACKING;  Surgeon: Clide Cliff, MD;  Location: [facility] [place];  Service: Gynecology;  Laterality: N/A;   PELVIC LAPAROSCOPY  1992   x2, for endometriosis   SKIN CANCER EXCISION     Squamous cell   VULVECTOMY N/A 06/04/2022   Procedure: WIDE EXCISION VULVECTOMY;  Surgeon: Clide Cliff, MD;  Location: [facility];  Service: Gynecology;  Laterality: N/A;    Family History  Problem Relation Age of Onset   Diverticulitis Mother  Alzheimer's disease Mother    Alzheimer's disease Maternal Grandmother    Alzheimer's disease Maternal Aunt    Alzheimer's disease Maternal Aunt    Ulcerative colitis Maternal Aunt    Ulcerative colitis Cousin    Colon cancer Neg Hx    Esophageal cancer Neg Hx    Rectal cancer Neg Hx    Stomach cancer Neg Hx    Colon polyps Neg Hx    Breast cancer  Neg Hx    Ovarian cancer Neg Hx    Endometrial cancer Neg Hx     Social History   Socioeconomic History   Marital status: Married    Spouse name: Not on file   Number of children: Not on file   Years of education: Not on file   Highest education level: Not on file  Occupational History   Occupation: Home maker  Tobacco Use   Smoking status: Never   Smokeless tobacco: Never  Vaping Use   Vaping Use: Never used  Substance and Sexual Activity   Alcohol use: No    Alcohol/week: 0.0 standard drinks of alcohol   Drug use: No   Sexual activity: Not Currently    Birth control/protection: Post-menopausal    Comment: 1st intercourse 64 yo-5 partners  Other Topics Concern   Not on file  Social History Narrative   3 adopted children, 2 boys and 1 girl   Social Determinants of Corporate investment banker Strain: Not on file  Food Insecurity: Not on file  Transportation Needs: Not on file  Physical Activity: Not on file  Stress: Not on file  Social Connections: Not on file    Current Medications:  Current Outpatient Medications:    Ascorbic Acid (VITAMIN C PO), Take 1,000 mg by mouth in the morning., Disp: , Rfl:    Cholecalciferol (VITAMIN D) 50 MCG (2000 UT) tablet, Take 2,000 Units by mouth in the morning., Disp: , Rfl:    estradiol (ESTRACE VAGINAL) 0.1 MG/GM vaginal cream, Place a pea size on your fingertip and apply to the opening of the vagina. Start with nightly for 2 weeks and then twice a week at night there after., Disp: 42.5 g, Rfl: 3   fluorouracil (EFUDEX) 5 % cream, Apply 1 Application topically daily as needed (skin spots)., Disp: , Rfl:    ibuprofen (ADVIL) 800 MG tablet, Take 1 tablet (800 mg total) by mouth every 8 (eight) hours as needed for moderate pain., Disp: 30 tablet, Rfl: 1   lidocaine (XYLOCAINE) 5 % ointment, Apply 1 Application topically 3 (three) times daily as needed for moderate pain (to the vulva)., Disp: 35.44 g, Rfl: 1  Review of  Symptoms: Complete 10-system review is negative except for as per HPI above.  Physical Exam: BP 101/60   Pulse 70   Temp 97.7 F (36.5 C)   Ht 5' 7.32" (1.71 m)   Wt 188 lb (85.3 kg)   LMP 11/02/2012   SpO2 98%   BMI 29.16 kg/m  General: Alert, oriented, no acute distress. HEENT: Normocephalic, atraumatic. Neck symmetric without masses. Sclera anicteric.  Chest: Normal work of breathing. Clear to auscultation bilaterally.   Cardiovascular: Regular rate and rhythm, no murmurs. Abdomen: Soft, nontender.  Normoactive bowel sounds. Extremities: Grossly normal range of motion.  Warm, well perfused.  No edema bilaterally. Skin: No rashes or lesions noted. Lymphatics: No cervical, supraclavicular, or inguinal adenopathy. GU: External genitalia with perineal body in midline completely healed Gluteal incision with fold of scar but complete  healing. No areas concerning for dysplasia. Speculum exam reveals normal vaginal mucosa and cervix.  Bimanual exam reveals normal cervix and mobile uterus. Exam chaperoned by Andrey Cota, RN   Laboratory & Radiologic Studies: None

## 2022-12-16 NOTE — Patient Instructions (Addendum)
It was a pleasure to see you in clinic today. - Exam looks good today. - Return visit planned for 6 months. - Let us know if you want [place] to resend referral to [facility] (the pain clinic for the vulva).   Thank you very much for allowing me to provide care for you today.  I appreciate your confidence in choosing our Gynecologic Oncology team at [facility].  If you have any questions about your visit today please call our office or send [place] a MyChart message and we will get back to you as soon as possible.

## 2022-12-22 ENCOUNTER — Encounter: Payer: Self-pay | Admitting: Psychiatry

## 2023-02-24 ENCOUNTER — Encounter: Payer: Self-pay | Admitting: Family Medicine

## 2023-02-24 DIAGNOSIS — G8929 Other chronic pain: Secondary | ICD-10-CM

## 2023-02-28 ENCOUNTER — Telehealth: Payer: Self-pay

## 2023-02-28 NOTE — Telephone Encounter (Signed)
I am writing to assist a patient who wishes to transfer her care to [facility]. The patient is currently established with [facility], but her physician is no longer with the practice because of health. She is seeking a second opinion and has expressed a desire to transfer her care to our facility as she no longer wishes to remain at her current practice.

## 2023-03-07 NOTE — Progress Notes (Unsigned)
57 y.o. G72P0010 Married Caucasian female here for annual exam.    Still having some vulvar pain following excision of dysplasia. Followed by Dr. Alvester Morin, and has next appointment in December.  Using vaginal estrogen cream externally through Dr. Alvester Morin.   Will see a new GI due to chronic right sided upper abdominal pain.   Some hair loss, which is ongoing.  Thinning in the temple region  [place] to the [facility].  PCP:   Hannah Beat, MD  Patient's last menstrual period was 11/02/2012.           Sexually active: No.  The current method of family planning is post menopausal status.    Exercising: Yes.     Walking, working outside Smoker:  no  Health Maintenance: Pap:  04/04/22 neg: HR HPV neg, 06/27/21 neg: HR HPV neg History of abnormal Pap:  yes, hx of LEEP MMG:  06/21/22 Breast Density Cat C, BI-RADS CAT 1 neg Colonoscopy: 08/07/20  BMD:   09/25/21  Result  osteopenia TDaP:  08/30/08 - She will do with her PCP Gardasil:   no HIV: 04/27/15 NR Hep C: n/a Screening Labs:  PCP   reports that she has never smoked. She has never used smokeless tobacco. She reports that she does not drink alcohol and does not use drugs.  Past Medical History:  Diagnosis Date   CIN III (cervical intraepithelial neoplasia III)    Endometriosis    Fibroid    GERD (gastroesophageal reflux disease)    IBS (irritable bowel syndrome)    Osteoporosis 05/2017   T score -2.0 prior DEXA 2016 T score -2.8 AP spine    Past Surgical History:  Procedure Laterality Date   CERVICAL BIOPSY  W/ LOOP ELECTRODE EXCISION  1999   CO2 LASER APPLICATION N/A 06/04/2022   Procedure: CO2 LASER APPLICATION;  Surgeon: Clide Cliff, MD;  Location: [facility];  Service: Gynecology;  Laterality: N/A;   COLPOSCOPY     EXPLORATORY LAPAROTOMY  2004   myomectomy and excision endometriosis   INCISION AND DRAINAGE ABSCESS N/A 07/30/2022   Procedure: INCISION AND DRAINAGE HEMATOMA OF VULVA WITH PACKING;  Surgeon: Clide Cliff, MD;  Location: [facility];  Service: Gynecology;  Laterality: N/A;   PELVIC LAPAROSCOPY  1992   x2, for endometriosis   SKIN CANCER EXCISION     Squamous cell   VULVECTOMY N/A 06/04/2022   Procedure: WIDE EXCISION VULVECTOMY;  Surgeon: Clide Cliff, MD;  Location: [facility];  Service: Gynecology;  Laterality: N/A;    Current Outpatient Medications  Medication Sig Dispense Refill   Ascorbic Acid (VITAMIN C PO) Take 1,000 mg by mouth in the morning.     Cholecalciferol (VITAMIN D) 50 MCG (2000 UT) tablet Take 2,000 Units by mouth in the morning.     estradiol (ESTRACE VAGINAL) 0.1 MG/GM vaginal cream Place a pea size on your fingertip and apply to the opening of the vagina. Start with nightly for 2 weeks and then twice a week at night there after. 42.5 g 3   fluorouracil (EFUDEX) 5 % cream Apply 1 Application topically daily as needed (skin spots).     ibuprofen (ADVIL) 800 MG tablet Take 1 tablet (800 mg total) by mouth every 8 (eight) hours as needed for moderate pain. 30 tablet 1   lidocaine (XYLOCAINE) 5 % ointment Apply 1 Application topically 3 (three) times daily as needed for moderate pain (to the vulva). (Patient not taking: Reported on 03/17/2023) 35.44 g 1  No current facility-administered medications for this visit.    Family History  Problem Relation Age of Onset   Diverticulitis Mother    Alzheimer's disease Mother    Alzheimer's disease Maternal Grandmother    Alzheimer's disease Maternal Aunt    Alzheimer's disease Maternal Aunt    Ulcerative colitis Maternal Aunt    Ulcerative colitis Cousin    Colon cancer Neg Hx    Esophageal cancer Neg Hx    Rectal cancer Neg Hx    Stomach cancer Neg Hx    Colon polyps Neg Hx    Breast cancer Neg Hx    Ovarian cancer Neg Hx    Endometrial cancer Neg Hx     Review of Systems  All other systems reviewed and are negative.   Exam:   BP 122/82 (BP Location: Right Arm, Patient Position: Sitting, Cuff  Size: Normal)   Pulse 69   Ht 5' 7.5" (1.715 m)   Wt 190 lb (86.2 kg)   LMP 11/02/2012   SpO2 98%   BMI 29.32 kg/m     General appearance: alert, cooperative and appears stated age Head: normocephalic, without obvious abnormality, atraumatic Neck: no adenopathy, supple, symmetrical, trachea midline and thyroid normal to inspection and palpation Lungs: clear to auscultation bilaterally Breasts: normal appearance, no masses or tenderness, No nipple retraction or dimpling, No nipple discharge or bleeding, No axillary adenopathy Heart: regular rate and rhythm Abdomen: soft, non-tender; no masses, no organomegaly Extremities: extremities normal, atraumatic, no cyanosis or edema Skin: skin color, texture, turgor normal. No rashes or lesions Lymph nodes: cervical, supraclavicular, and axillary nodes normal. Neurologic: grossly normal  Pelvic: External genitalia:  scar of left perineum/perianal region.               No abnormal inguinal nodes palpated.              Urethra:  normal appearing urethra with no masses, tenderness or lesions              Bartholins and Skenes: normal                 Vagina: normal appearing vagina with normal color and discharge, no lesions              Cervix: no lesions              Pap taken: yes Bimanual Exam:  Uterus:  normal size, contour, position, consistency, mobility, non-tender              Adnexa: no mass, fullness, tenderness              Rectal exam: yes.  Confirms.              Anus:  normal sphincter tone, no lesions  Chaperone was present for exam:  Warren Lacy, CMA  Assessment:   Well woman visit with gynecologic exam. Hx CIN III.   Status post LEEP in 1999. Hx VIN III.  Status post simple partial vulvectomy with rhomboid flap and CO2 laser ablation.   Status post debridement with healing by secondary intention.  No sign of recurrence.  Hx uterine fibroid.   Hx prior myomectomy.  Osteopenia.  Off Fosamax for about 3 years.  Hair loss.    Plan: Mammogram screening discussed. Self breast awareness reviewed. Pap and HR HPV as above. Guidelines for Calcium, Vitamin D, regular exercise program including cardiovascular and weight bearing exercise. BMD in 2025.  Check TSH, CBC, iron and ferritin.  She declines check of testosterone level.  Follow up annually and prn.

## 2023-03-12 NOTE — Telephone Encounter (Signed)
Dr. Allegra Lai did not approved patient to be seen the only provider that did is tina. Patient is schedule with Dr. Allegra Lai on 03/19/2023 at 1:00pm. Please cancel this appointment and reschedule it with Inetta Fermo garrett PA and patient is in a 15 min spot with Dr. Allegra Lai and is a new patient

## 2023-03-12 NOTE — Telephone Encounter (Signed)
Appt is scheduled for 04/15/2023 with Celso Amy

## 2023-03-13 ENCOUNTER — Telehealth: Payer: Self-pay | Admitting: Physician Assistant

## 2023-03-13 NOTE — Telephone Encounter (Signed)
Patient is scheduled for appointment with me 04/15/2023.  Evaluate abdominal pain.  New patient transfer of care approved.  Please move up patient's appointment with me to next week if possible.  Okay to use 11:15 work in spot. Celso Amy, PA-C

## 2023-03-17 ENCOUNTER — Other Ambulatory Visit (HOSPITAL_COMMUNITY)
Admission: RE | Admit: 2023-03-17 | Discharge: 2023-03-17 | Disposition: A | Discharge status: Home / Self Care | Payer: 59 | Source: Ambulatory Visit | Attending: Obstetrics and Gynecology | Admitting: Obstetrics and Gynecology

## 2023-03-17 ENCOUNTER — Encounter: Payer: Self-pay | Admitting: Obstetrics and Gynecology

## 2023-03-17 ENCOUNTER — Ambulatory Visit (INDEPENDENT_AMBULATORY_CARE_PROVIDER_SITE_OTHER): Payer: 59 | Admitting: Obstetrics and Gynecology

## 2023-03-17 VITALS — BP 122/82 | HR 69 | Ht 67.5 in | Wt 190.0 lb

## 2023-03-17 DIAGNOSIS — Z124 Encounter for screening for malignant neoplasm of cervix: Secondary | ICD-10-CM

## 2023-03-17 DIAGNOSIS — Z87412 Personal history of vulvar dysplasia: Secondary | ICD-10-CM | POA: Diagnosis present

## 2023-03-17 DIAGNOSIS — R790 Abnormal level of blood mineral: Secondary | ICD-10-CM

## 2023-03-17 DIAGNOSIS — Z87410 Personal history of cervical dysplasia: Secondary | ICD-10-CM | POA: Diagnosis present

## 2023-03-17 DIAGNOSIS — L659 Nonscarring hair loss, unspecified: Secondary | ICD-10-CM | POA: Diagnosis not present

## 2023-03-17 DIAGNOSIS — Z01419 Encounter for gynecological examination (general) (routine) without abnormal findings: Secondary | ICD-10-CM

## 2023-03-17 NOTE — Progress Notes (Unsigned)
Celso Amy, PA-C [address]  [address]  [place], [place] [address]  Main: [phone]  Fax: [phone]   Gastroenterology Consultation  Referring Provider:     Hannah Beat, MD Primary Care Physician:  Hannah Beat, MD Primary Gastroenterologist:  Celso Amy, PA-C  Reason for Consultation:     Abdominal Pain, IBS        HPI:   Jennifer Velez is a 57 y.o. y/o female referred for consultation & management  by Hannah Beat, MD. Referred to evaluate chronic abdominal pain with possible IBS.  Current Symptoms: Patient continues to have chronic right upper quadrant abdominal pain for 2 years, since 2022.  It is constant sharp pain.  It is not affected by eating.  She denies nausea or vomiting.  It is worse laying down and hurts to ride a lawnmower.  Worse with movement.  She also has chronic rectal pain and burning for 2 years.  History of hemorrhoids on and off since 2022.  She denies diarrhea.  Has normal soft bowel movement every morning.  Rarely has constipation.  Occasionally feels incomplete bowel movement evacuation.  Occasionally has fecal smearing when she passes gas.  She denies rectal bleeding or unintentional weight loss.  Has history of irritable bowel syndrome as a child.  She eats healthy high-fiber diet.  Patient last saw GI Dr. Rob Bunting 05/2021 at [facility].  Requesting transfer GI care to our office.  GI History:  1.  History of precancerous colon polyps.   -Colonoscopy 2016 2 subcentimeter adenomas.   -Repeat Colonoscopy February 2022 done for minor rectal bleeding.  2 small adenomatous polyps removed. PanDiverticulosis. 7-year repeat (due 08/2027).  2.  Right upper quadrant pain work-up 2022 by Dr. Christella Hartigan:   -Lab 11/2020 lipase is slightly elevated 133 and then normalized 1 month later.  CBC normal, complete metabolic profile normal, amylase normal.   -EGD August 2022 minimal distal gastritis.  Biopsies were negative for H. Pylori.   -CT scan abdomen pelvis WITHOUT IV CONTRAST May 2022 "small focal region of geographic mesenteric stranding in the mid abdomen, associated reactive lymph nodes, appearance compatible with mesenteritis".   -Abdominal ultrasound June 2022 cysts in the liver.  Otherwise examination was normal including a normal gallbladder.   -HIDA scan July 2022 was normal.  -CT scan abd/pelvis WITH IV CONTRAST September 2022 did not explain her symptoms.  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  Previous history of endometriosis and exploratory laparotomy, myomectomy, and excision of endometriosis in 2004. Pelvic transvaginal ultrasound 11/07/2022 showed a stable uterine fibroid, otherwise normal.     Past Medical History:  Diagnosis Date   CIN III (cervical intraepithelial neoplasia III)    Endometriosis    Fibroid    GERD (gastroesophageal reflux disease)    IBS (irritable bowel syndrome)    Osteoporosis 05/2017   T score -2.0 prior DEXA 2016 T score -2.8 AP spine    Past Surgical History:  Procedure Laterality Date   CERVICAL BIOPSY  W/ LOOP ELECTRODE EXCISION  1999   CO2 LASER APPLICATION N/A 06/04/2022   Procedure: CO2 LASER APPLICATION;  Surgeon: Clide Cliff, MD;  Location: [facility];  Service: Gynecology;  Laterality: N/A;   COLPOSCOPY     EXPLORATORY LAPAROTOMY  2004   myomectomy and excision endometriosis   INCISION AND DRAINAGE ABSCESS N/A 07/30/2022   Procedure: INCISION AND DRAINAGE HEMATOMA OF VULVA WITH PACKING;  Surgeon: Clide Cliff,  MD;  Location: [facility];  Service: Gynecology;  Laterality: N/A;   PELVIC LAPAROSCOPY  1992   x2, for endometriosis   SKIN CANCER EXCISION     Squamous cell   VULVECTOMY N/A 06/04/2022   Procedure: WIDE EXCISION VULVECTOMY;  Surgeon: Clide Cliff, MD;  Location: [facility];  Service: Gynecology;  Laterality: N/A;    Prior to Admission medications    Medication Sig Start Date End Date Taking? Authorizing Provider  Ascorbic Acid (VITAMIN C PO) Take 1,000 mg by mouth in the morning.    [provider]  Cholecalciferol (VITAMIN D) 50 MCG (2000 UT) tablet Take 2,000 Units by mouth in the morning.    [provider]  estradiol (ESTRACE VAGINAL) 0.1 MG/GM vaginal cream Place a pea size on your fingertip and apply to the opening of the vagina. Start with nightly for 2 weeks and then twice a week at night there after. 08/26/22   Clide Cliff, MD  fluorouracil (EFUDEX) 5 % cream Apply 1 Application topically daily as needed (skin spots). 11/21/21   [provider]  ibuprofen (ADVIL) 800 MG tablet Take 1 tablet (800 mg total) by mouth every 8 (eight) hours as needed for moderate pain. 07/31/22   Cross, Efraim Kaufmann D, NP  lidocaine (XYLOCAINE) 5 % ointment Apply 1 Application topically 3 (three) times daily as needed for moderate pain (to the vulva). Patient not taking: Reported on 03/17/2023 08/05/22   Warner Mccreedy D, NP    Family History  Problem Relation Age of Onset   Diverticulitis Mother    Alzheimer's disease Mother    Alzheimer's disease Maternal Grandmother    Alzheimer's disease Maternal Aunt    Alzheimer's disease Maternal Aunt    Ulcerative colitis Maternal Aunt    Ulcerative colitis Cousin    Colon cancer Neg Hx    Esophageal cancer Neg Hx    Rectal cancer Neg Hx    Stomach cancer Neg Hx    Colon polyps Neg Hx    Breast cancer Neg Hx    Ovarian cancer Neg Hx    Endometrial cancer Neg Hx      Social History   Tobacco Use   Smoking status: Never   Smokeless tobacco: Never  Vaping Use   Vaping status: Never Used  Substance Use Topics   Alcohol use: No    Alcohol/week: 0.0 standard drinks of alcohol   Drug use: No    Allergies as of 03/18/2023 - Review Complete 03/18/2023  Allergen Reaction Noted   Sulfonamide derivatives Hives 12/19/2008    Review of Systems:    All systems reviewed and  negative except where noted in HPI.   Physical Exam:  BP (!) 91/57   Pulse 67   Temp 98.5 F (36.9 C)   Ht 5\' 7"  (1.702 m)   Wt 190 lb 9.6 oz (86.5 kg)   LMP 11/02/2012   BMI 29.85 kg/m  Patient's last menstrual period was 11/02/2012.  General:   Alert,  Well-developed, well-nourished, pleasant and cooperative in NAD Lungs:  Respirations even and unlabored.  Clear throughout to auscultation.   No wheezes, crackles, or rhonchi. No acute distress. Heart:  Regular rate and rhythm; no murmurs, clicks, rubs, or gallops. Abdomen:  Normal bowel sounds.  No bruits.  Soft, and non-distended without masses, hepatosplenomegaly or hernias noted.  Mild RUQ Tenderness; Rest of abdomen is not tender.  No guarding or rebound tenderness.    Rectal: No external hemorrhoids or visible rashes.  There is scar tissue to the right of the rectum which is well-healed.  Mild tenderness with light palpation around the skin of the rectum.  Anoscope exam was performed and showed 1 small swollen internal hemorrhoid which is not bleeding.  No rectal masses or tenderness during anoscope exam.  No evidence of anal fissure or abscess.  Imaging Studies: No results found.  Assessment and Plan:   Harumi Coger is a 57 y.o. y/o female has been referred for:  1.  Chronic RUQ abdominal pain since 2022.  Extensive negative GI evaluation including normal ultrasound, HIDA scan, and 2 Negative CT scans.  Could be musculoskeletal pain.  Order abdominal MRI/MRCP  Consider trial of dicyclomine in the future if pain persists.  2.  Irritable bowel syndrome with irregular bowel habit  Start Citrucel or Benefiber powder, mix 1 capful in a drink once daily   3.  Internal Hemorrhoid  Rx Hydrocortisone Suppositories 25mg  Insert 1 into rectum once daily at bedtime for 10-14 days. Stressed importance of treating underlying constipation. Discussed Internal Hemorrhoid Banding if no improvement with conservative treament.   4.   Rectal pain, chronic for 2 years, uncertain etiology; no obvious external hemorrhoid, fissure, or source for external rectal pain.  Recommend OTC RectiCare lidocaine 5% rectal cream as needed  Use warm water sitz bath with Epsom salt  5.  History of adenomatous colon polyps; colonoscopy is up-to-date.  Last colonoscopy 08/2020 showed 2 small adenomatous polyps removed.  7-year repeat colonoscopy will be due 08/2027.   Follow up in 4-6 weeks.  Celso Amy, PA-C

## 2023-03-17 NOTE — Patient Instructions (Signed)

## 2023-03-18 ENCOUNTER — Ambulatory Visit: Payer: 59 | Admitting: Physician Assistant

## 2023-03-18 ENCOUNTER — Encounter: Payer: Self-pay | Admitting: Physician Assistant

## 2023-03-18 VITALS — BP 91/57 | HR 67 | Temp 98.5°F | Ht 67.0 in | Wt 190.6 lb

## 2023-03-18 DIAGNOSIS — R1011 Right upper quadrant pain: Secondary | ICD-10-CM

## 2023-03-18 DIAGNOSIS — K589 Irritable bowel syndrome without diarrhea: Secondary | ICD-10-CM | POA: Diagnosis not present

## 2023-03-18 DIAGNOSIS — K648 Other hemorrhoids: Secondary | ICD-10-CM

## 2023-03-18 DIAGNOSIS — Z86010 Personal history of colonic polyps: Secondary | ICD-10-CM

## 2023-03-18 LAB — CBC
HCT: 41.9 % (ref 35.0–45.0)
Hemoglobin: 14.4 g/dL (ref 11.7–15.5)
MCH: 33.4 pg — ABNORMAL HIGH (ref 27.0–33.0)
MCHC: 34.4 g/dL (ref 32.0–36.0)
MCV: 97.2 fL (ref 80.0–100.0)
MPV: 9.4 fL (ref 7.5–12.5)
Platelets: 280 10*3/uL (ref 140–400)
RBC: 4.31 10*6/uL (ref 3.80–5.10)
RDW: 11.8 % (ref 11.0–15.0)
WBC: 5.7 10*3/uL (ref 3.8–10.8)

## 2023-03-18 LAB — FERRITIN: Ferritin: 143 ng/mL (ref 16–232)

## 2023-03-18 LAB — IRON: Iron: 172 ug/dL — ABNORMAL HIGH (ref 45–160)

## 2023-03-18 LAB — TSH: TSH: 1.2 m[IU]/L (ref 0.40–4.50)

## 2023-03-18 MED ORDER — HYDROCORTISONE ACETATE 25 MG RE SUPP: 25.0000 mg | Freq: Every day | RECTAL | 1 refills | Status: AC

## 2023-03-18 NOTE — Addendum Note (Signed)
Addended by: Ardell Isaacs, Debbe Bales E on: 03/18/2023 07:33 PM   Modules accepted: Orders

## 2023-03-18 NOTE — Patient Instructions (Signed)
MRI / MRCP scheduled 03/25/23 @ 10:45 am. Nothing to eat/drink 4 hours prior.

## 2023-03-19 ENCOUNTER — Ambulatory Visit: Payer: 59 | Admitting: Gastroenterology

## 2023-03-19 ENCOUNTER — Encounter: Payer: Self-pay | Admitting: *Deleted

## 2023-03-19 NOTE — Progress Notes (Signed)
Seen by patient Jennifer Velez on 03/18/2023 8:13 PM MyChart message to patient with instructions to schedule Lab appt.

## 2023-03-21 LAB — CYTOLOGY - PAP
Comment: NEGATIVE
Diagnosis: NEGATIVE
High risk HPV: NEGATIVE

## 2023-03-25 ENCOUNTER — Other Ambulatory Visit: Payer: Self-pay | Admitting: Physician Assistant

## 2023-03-25 ENCOUNTER — Ambulatory Visit
Admission: RE | Admit: 2023-03-25 | Discharge: 2023-03-25 | Disposition: A | Discharge status: Home / Self Care | Payer: 59 | Source: Ambulatory Visit | Attending: Physician Assistant | Admitting: Physician Assistant

## 2023-03-25 DIAGNOSIS — K831 Obstruction of bile duct: Secondary | ICD-10-CM | POA: Insufficient documentation

## 2023-03-25 DIAGNOSIS — R1011 Right upper quadrant pain: Secondary | ICD-10-CM | POA: Diagnosis present

## 2023-03-25 MED ORDER — GADOBUTROL 1 MMOL/ML IV SOLN
7.5000 mL | Freq: Once | INTRAVENOUS | Status: AC | PRN
  Administered 2023-03-25: 7.5 mL via INTRAVENOUS

## 2023-03-31 ENCOUNTER — Telehealth: Payer: Self-pay

## 2023-03-31 ENCOUNTER — Other Ambulatory Visit: Payer: Self-pay | Admitting: Physician Assistant

## 2023-03-31 DIAGNOSIS — R1084 Generalized abdominal pain: Secondary | ICD-10-CM

## 2023-03-31 DIAGNOSIS — R935 Abnormal findings on diagnostic imaging of other abdominal regions, including retroperitoneum: Secondary | ICD-10-CM

## 2023-03-31 NOTE — Telephone Encounter (Signed)
Patient notified of the below.  Upper GI series scheduled @ [facility] 04-03-23 8:30 am . Nothing to eat/drink after midnight.  Patient will come to office  this week for labs.  Abdominal MRI/MRCP shows:  1.  Abnormal area with prominent lymph nodes in the jejunum (small intestine).  2.  Encapsulated appearance of the pancreas.  Concerning for possible autoimmune pancreatitis.  3.  No other acute abnormality.   I discussed MRI results with Dr. Tobi Bastos.  Then I called and notified patient of results.   Dr. Tobi Bastos recommends:  1.  Order labs: Hepatic panel, lipase, CRP, IgG4, celiac panel.  2.  Schedule upper GI series with small bowel follow-through. (UGIS w/ SBFT).  Velna Hatchet, Please schedule LAB appt. And UGIS w/ SBFT.  I put lab orders in.          Diagnosis: Abdominal Pain, and Abnormal Abdominal MRI.

## 2023-03-31 NOTE — Progress Notes (Signed)
Abdominal MRI/MRCP shows: 1.  Abnormal area with prominent lymph nodes in the jejunum (small intestine). 2.  Encapsulated appearance of the pancreas.  Concerning for possible autoimmune pancreatitis. 3.  No other acute abnormality.  I discussed MRI results with Dr. Tobi Bastos.  Then I called and notified patient of results.   Dr. Tobi Bastos recommends: 1.  Order labs: Hepatic panel, lipase, CRP, IgG4, celiac panel. 2.  Schedule upper GI series with small bowel follow-through. (UGIS w/ SBFT). Velna Hatchet, Please schedule LAB appt. And UGIS w/ SBFT.  I put lab orders in.          Diagnosis: Abdominal Pain, and Abnormal Abdominal MRI.

## 2023-03-31 NOTE — Telephone Encounter (Signed)
Per [facility] UGIS    Abdominal MRI/MRCP shows:  1.  Abnormal area with prominent lymph nodes in the jejunum (small intestine).  2.  Encapsulated appearance of the pancreas.  Concerning for possible autoimmune pancreatitis.  3.  No other acute abnormality.   I discussed MRI results with Dr. Tobi Bastos.  Then I called and notified patient of results.   Dr. Tobi Bastos recommends:  1.  Order labs: Hepatic panel, lipase, CRP, IgG4, celiac panel.  2.  Schedule upper GI series with small bowel follow-through. (UGIS w/ SBFT).  Velna Hatchet, Please schedule LAB appt. And UGIS w/ SBFT.  I put lab orders in.          Diagnosis: Abdominal Pain, and Abnormal Abdominal MRI.

## 2023-04-03 ENCOUNTER — Ambulatory Visit
Admission: RE | Admit: 2023-04-03 | Discharge: 2023-04-03 | Disposition: A | Discharge status: Home / Self Care | Payer: 59 | Source: Ambulatory Visit | Attending: Physician Assistant | Admitting: Physician Assistant

## 2023-04-03 DIAGNOSIS — R935 Abnormal findings on diagnostic imaging of other abdominal regions, including retroperitoneum: Secondary | ICD-10-CM | POA: Diagnosis present

## 2023-04-06 LAB — IGG 4: IgG, Subclass 4: 30 mg/dL (ref 2–96)

## 2023-04-06 LAB — CELIAC DISEASE AB SCREEN W/RFX
Antigliadin Abs, IgA: 6 U (ref 0–19)
IgA/Immunoglobulin A, Serum: 214 mg/dL (ref 87–352)

## 2023-04-06 LAB — HEPATIC FUNCTION PANEL
ALT: 18 [IU]/L (ref 0–32)
AST: 17 [IU]/L (ref 0–40)
Albumin: 4.1 g/dL (ref 3.8–4.9)
Alkaline Phosphatase: 84 [IU]/L (ref 44–121)
Bilirubin Total: 0.3 mg/dL (ref 0.0–1.2)
Bilirubin, Direct: <0.1 mg/dL (ref 0.00–0.40)
Total Protein: 6.4 g/dL (ref 6.0–8.5)

## 2023-04-06 LAB — C-REACTIVE PROTEIN: CRP: <1 mg/L (ref 0–10)

## 2023-04-06 LAB — LIPASE: Lipase: 66 U/L (ref 14–72)

## 2023-04-15 ENCOUNTER — Ambulatory Visit: Payer: 59 | Admitting: Physician Assistant

## 2023-04-18 ENCOUNTER — Telehealth: Payer: Self-pay

## 2023-04-18 NOTE — Telephone Encounter (Signed)
-----   Message from Celso Amy sent at 04/18/2023 10:54 AM EDT ----- Call and notify patient of results.  Please advise her to keep follow-up appointment with me as scheduled.  Let her know I have been doing research about mesenteric panniculitis which was seen on her CT scan.  I also discussed with Dr. Tobi Bastos.  We do not recommend treatment at this time.  Sometimes it will resolve on its own.  If symptoms persist, we may treat with prednisone. Celso Amy, PA-C

## 2023-04-18 NOTE — Progress Notes (Signed)
Call and notify patient of results.  Please advise her to keep follow-up appointment with me as scheduled.  Let her know I have been doing research about mesenteric panniculitis which was seen on her CT scan.  I also discussed with Dr. Tobi Bastos.  We do not recommend treatment at this time.  Sometimes it will resolve on its own.  If symptoms persist, we may treat with prednisone. Celso Amy, PA-C

## 2023-04-18 NOTE — Telephone Encounter (Signed)
Patient verbalized understanding of results  

## 2023-04-27 ENCOUNTER — Telehealth: Payer: Self-pay | Admitting: Physician Assistant

## 2023-04-27 NOTE — Telephone Encounter (Signed)
Pt. Called requesting to cancel appt. With Celso Amy, PA on Tuesday 04/29/23.  Instead, she would like to schedule internal hemorrhoid banding procedure with Dr. Tobi Bastos.  She tried hydrocortisone suppositories with little benefit.  She still has symptoms from internal hemorrhoids.  I talked with pt. And I agree with cancel OV with me 10/29.  Go ahead and schedule internal hemorrhoid banding with Dr. Tobi Bastos. Celso Amy, PA-C

## 2023-04-28 ENCOUNTER — Telehealth: Payer: Self-pay

## 2023-04-28 ENCOUNTER — Other Ambulatory Visit: Payer: 59

## 2023-04-28 DIAGNOSIS — R790 Abnormal level of blood mineral: Secondary | ICD-10-CM

## 2023-04-28 NOTE — Telephone Encounter (Signed)
Patient called and left a voicemail wanting to schedule appointment I called patient back to let her know we received her message, I asked the patient would she like to keep that date. She stated that for right now that will work when she get home and check her calendar and that is a bad date then she will call back to or tomorrow morning.

## 2023-04-28 NOTE — Telephone Encounter (Signed)
Left detailed message letting patient know appointment cancelled 04/29/23 with Inetta Fermo and scheduled patient for hemorrhoid banding with Dr.Anna 06/12/23 @ 2:15- I asked patient to return call to office to confirm.

## 2023-04-29 ENCOUNTER — Encounter: Payer: Self-pay | Admitting: Physician Assistant

## 2023-04-29 ENCOUNTER — Ambulatory Visit: Payer: 59 | Admitting: Physician Assistant

## 2023-04-29 LAB — CBC
HCT: 42.8 % (ref 35.0–45.0)
Hemoglobin: 14.5 g/dL (ref 11.7–15.5)
MCH: 33 pg (ref 27.0–33.0)
MCHC: 33.9 g/dL (ref 32.0–36.0)
MCV: 97.5 fL (ref 80.0–100.0)
MPV: 9.1 fL (ref 7.5–12.5)
Platelets: 296 10*3/uL (ref 140–400)
RBC: 4.39 10*6/uL (ref 3.80–5.10)
RDW: 11.7 % (ref 11.0–15.0)
WBC: 6.3 10*3/uL (ref 3.8–10.8)

## 2023-04-29 LAB — FERRITIN: Ferritin: 198 ng/mL (ref 16–232)

## 2023-04-29 LAB — IRON: Iron: 183 ug/dL — ABNORMAL HIGH (ref 45–160)

## 2023-04-30 ENCOUNTER — Encounter: Payer: Self-pay | Admitting: Obstetrics and Gynecology

## 2023-05-07 ENCOUNTER — Telehealth: Payer: Self-pay

## 2023-05-07 ENCOUNTER — Telehealth: Payer: Self-pay | Admitting: Physician Assistant

## 2023-05-07 DIAGNOSIS — R790 Abnormal level of blood mineral: Secondary | ICD-10-CM

## 2023-05-07 NOTE — Telephone Encounter (Signed)
Call and notify patient we received lab results from her gynecologist, Dr. Duard Larsen.  Her iron was a little bit elevated.  Please ask patient if she is taking any iron supplements?  I recommend stop taking all OTC iron supplements.  Schedule lab visit in 2 - 4 weeks to check ferritin, iron panel and hemochromatosis lab.  I put lab order in ECW. Celso Amy, PA-C

## 2023-05-07 NOTE — Telephone Encounter (Signed)
Call and notify patient we received lab results from her gynecologist, Dr. Duard Larsen.  Her iron was a little bit elevated.  Please ask patient if she is taking any iron supplements?  I recommend stop taking all OTC iron supplements.  Schedule lab visit in 2 - 4 weeks to check ferritin, iron panel and hemochromatosis lab.  I put lab order in ECW. Celso Amy, PA-C   Patient has an appointment 05/26/23 and she will do labs at this appointment.

## 2023-05-19 ENCOUNTER — Other Ambulatory Visit: Payer: Self-pay | Admitting: Obstetrics and Gynecology

## 2023-05-19 DIAGNOSIS — Z1231 Encounter for screening mammogram for malignant neoplasm of breast: Secondary | ICD-10-CM

## 2023-05-26 ENCOUNTER — Ambulatory Visit: Payer: 59 | Admitting: Gastroenterology

## 2023-05-26 VITALS — BP 127/81 | HR 69 | Temp 98.2°F | Ht 67.0 in | Wt 193.0 lb

## 2023-05-26 DIAGNOSIS — L299 Pruritus, unspecified: Secondary | ICD-10-CM

## 2023-05-26 DIAGNOSIS — K648 Other hemorrhoids: Secondary | ICD-10-CM

## 2023-05-26 DIAGNOSIS — K625 Hemorrhage of anus and rectum: Secondary | ICD-10-CM

## 2023-05-26 DIAGNOSIS — R935 Abnormal findings on diagnostic imaging of other abdominal regions, including retroperitoneum: Secondary | ICD-10-CM

## 2023-05-26 DIAGNOSIS — Z860101 Personal history of adenomatous and serrated colon polyps: Secondary | ICD-10-CM

## 2023-05-26 DIAGNOSIS — K640 First degree hemorrhoids: Secondary | ICD-10-CM

## 2023-05-26 NOTE — Patient Instructions (Signed)
Please call [phone] if you need to reschedule

## 2023-05-26 NOTE — Progress Notes (Signed)
Patient follow-ups today for banding of hemorrhoids    Summary of history :  Here today for him rectal banding history of rectal bleeding.  Called our office on 04/29/2023 for discomfort from hemorrhoids.  Colonoscopy on 08/07/2020 by Dr. Christella Hartigan hemorrhoids based on my assessment of the pictures  Two 4 to 6 mm polyps were resected from the ascending colon and cecum with a cold snare.  Which were tubular adenomas.  She has tried conservative measures but continues to have issues of pruritus rectal bleeding on and off would like to get her hemorrhoids banded.  She also complains of chronic abdominal pain which has been evaluated in the past there were concerns of mesenteric panniculitis.  Digital rectal exam performed in the presence of a chaperone. External anal findings: Normal Internal findings: , No masses, no blood on glove noticed.  Abdomen present in the room Melanie CMA  PROCEDURE NOTE: The patient presents with symptomatic grade 1 hemorrhoids, unresponsive to maximal medical therapy, requesting rubber band ligation of his/her hemorrhoidal disease.  All risks, benefits and alternative forms of therapy were described and informed consent was obtained.  In the Left Lateral Decubitus position (if anoscopy is performed) anoscopic examination revealed grade 1 hemorrhoids in the all position(s).   The decision was made to band the LL internal hemorrhoid, and the [facility] [place]an System was used to perform band ligation without complication.  Digital anorectal examination was then performed to assure proper positioning of the band, and to adjust the banded tissue as required.  The patient was discharged home without pain or other issues.  Dietary and behavioral recommendations were given and (if necessary - prescriptions were given), along with follow-up instructions.  The patient will return 4 weeks for follow-up and possible additional banding as required.  No complications were encountered and  the patient tolerated the procedure well.   Plan:  Avoid constipation.  C Repeat MRI MRCP of the abdomen to evaluate the status of mesenteric panniculitis  Follow-up: 4 weeks with me  Dr Wyline Mood MD,MRCP [facility]) Gastroenterology/Hepatology Pager: [phone]

## 2023-05-26 NOTE — Addendum Note (Signed)
Addended by: Tawnya Crook on: 05/26/2023 03:23 PM   Modules accepted: Orders

## 2023-06-12 ENCOUNTER — Ambulatory Visit: Payer: 59 | Admitting: Gastroenterology

## 2023-06-16 ENCOUNTER — Other Ambulatory Visit: Payer: Self-pay | Admitting: Gastroenterology

## 2023-06-16 ENCOUNTER — Ambulatory Visit
Admission: RE | Admit: 2023-06-16 | Discharge: 2023-06-16 | Disposition: A | Discharge status: Home / Self Care | Payer: 59 | Source: Ambulatory Visit | Attending: Gastroenterology | Admitting: Gastroenterology

## 2023-06-16 ENCOUNTER — Inpatient Hospital Stay: Payer: 59 | Attending: Psychiatry | Admitting: Gynecologic Oncology

## 2023-06-16 VITALS — BP 121/60 | HR 69 | Temp 98.7°F | Resp 20 | Wt 194.2 lb

## 2023-06-16 DIAGNOSIS — R935 Abnormal findings on diagnostic imaging of other abdominal regions, including retroperitoneum: Secondary | ICD-10-CM | POA: Insufficient documentation

## 2023-06-16 DIAGNOSIS — D071 Carcinoma in situ of vulva: Secondary | ICD-10-CM

## 2023-06-16 DIAGNOSIS — N94819 Vulvodynia, unspecified: Secondary | ICD-10-CM | POA: Diagnosis not present

## 2023-06-16 DIAGNOSIS — Z86002 Personal history of in-situ neoplasm of other and unspecified genital organs: Secondary | ICD-10-CM | POA: Insufficient documentation

## 2023-06-16 DIAGNOSIS — Z9079 Acquired absence of other genital organ(s): Secondary | ICD-10-CM | POA: Diagnosis not present

## 2023-06-16 MED ORDER — GADOBUTROL 1 MMOL/ML IV SOLN
10.0000 mL | Freq: Once | INTRAVENOUS | Status: AC | PRN
  Administered 2023-06-16: 10 mL via INTRAVENOUS

## 2023-06-16 NOTE — Patient Instructions (Addendum)
It was a pleasure to see you in clinic today. Happy Holidays to you.  - No concerning findings on today's exam. Plan on restarting the estrogen cream to the vulva at the entrance of the vagina where there is a bridge of scar tissue. You can apply this three times a week and lightly massage the area. You can also mix this with lidocaine. - Return visit planned for 6 months. - Please call for any new symptoms such as itching, pain, discharge, bleeding from your vulva.   Thank you very much for allowing me to provide care for you today.  I appreciate your confidence in choosing our Gynecologic Oncology team at [facility].  If you have any questions about your visit today please call our office or send [place] a MyChart message and we will get back to you as soon as possible.

## 2023-06-16 NOTE — Progress Notes (Unsigned)
Gynecologic Oncology Return Clinic Visit  Date of Service: 06/16/2023 Referring Provider: Patton Salles, MD   Assessment & Plan: Jennifer Velez is a 57 y.o. 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 S/p wound incision and debridement on 07/30/22, now with complete healing by secondary intention.  VIN3: - Recommend close surveillance with q35mo exams x 26yrs then annually. Recent pap from Sept 2024 negative, HR HPV neg - Exam NED today. - Signs/symptoms of recurrence reviewed  Vulvodynia: -Topical lidocaine given and use discussed -She will plan to restart topical estrogen three times a week at night to the vulva to see if her symptoms improve. Discussed light massage of adhesion at the introitus when applying the cream.  RTC in 6 mo. Reportable signs and symptoms reviewed.  Warner Mccreedy NP Gynecologic Oncology  Medical Decision Making I personally spent  TOTAL 20 minutes face-to-face and non-face-to-face in the care of this patient, which includes all pre, intra, and post visit time on the date of service.  ----------------------- Reason for Visit: Follow-up VIN  Treatment history: Patient noted to have 4 x 2 mm raised area on right vulva at time of annual exam on 03/13/2022. 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 A colposcopy was performed on 04/04/2022 and noted a raised area on the right perineum and a new biopsy obtained which showed VIN 3. She underwent simple partial vulvectomy with rhomboid rotational flap and CO2 laser ablation on 06/04/2022.  Final pathology showing VIN 3, negative margins. Postop course complicated by wound cellulitis requiring antibiotics x2 followed by incision and debridement of left gluteal incision from flap (07/30/22), packed WTD to close by secondary intention.  Interval  History: Patient presents to the office today for continued follow-up for VIN 3.  She reports doing well and staying busy since her last visit.  She just recently had an upper abdominal MRI to follow up on previous findings of possible mesenteric panniculitis on MRI from 03/25/2023.  She states she has been dealing with symptoms of upper abdominal pain, significant bloating, and gas for the past two years. She reports urinary frequency but no dysuria, urgency, hematuria. Gets up several times a night to void. Having normal bowel function. Had recent hemorrhoid banding. Vulvar has been more sore lately. No itching, lesions, bleeding, or abnormal discharge. She has not been using topical estrogen recently. Tolerating diet with no nausea or emesis.   Past Medical/Surgical History: Past Medical History:  Diagnosis Date   CIN III (cervical intraepithelial neoplasia III)    Endometriosis    Fibroid    GERD (gastroesophageal reflux disease)    IBS (irritable bowel syndrome)    Iron excess    Osteoporosis 05/2017   T score -2.0 prior DEXA 2016 T score -2.8 AP spine    Past Surgical History:  Procedure Laterality Date   CERVICAL BIOPSY  W/ LOOP ELECTRODE EXCISION  1999   CO2 LASER APPLICATION N/A 06/04/2022   Procedure: CO2 LASER APPLICATION;  Surgeon: Clide Cliff, MD;  Location: [facility];  Service: Gynecology;  Laterality: N/A;   COLPOSCOPY     EXPLORATORY LAPAROTOMY  2004   myomectomy and excision endometriosis   INCISION AND DRAINAGE ABSCESS N/A 07/30/2022   Procedure: INCISION AND DRAINAGE HEMATOMA OF VULVA WITH PACKING;  Surgeon: Clide Cliff, MD;  Location: [facility] [place];  Service: Gynecology;  Laterality: N/A;  PELVIC LAPAROSCOPY  1992   x2, for endometriosis   SKIN CANCER EXCISION     Squamous cell   VULVECTOMY N/A 06/04/2022   Procedure: WIDE EXCISION VULVECTOMY;  Surgeon: Clide Cliff, MD;  Location: [facility];  Service: Gynecology;  Laterality: N/A;    Family  History  Problem Relation Age of Onset   Diverticulitis Mother    Alzheimer's disease Mother    Alzheimer's disease Maternal Grandmother    Alzheimer's disease Maternal Aunt    Alzheimer's disease Maternal Aunt    Ulcerative colitis Maternal Aunt    Ulcerative colitis Cousin    Colon cancer Neg Hx    Esophageal cancer Neg Hx    Rectal cancer Neg Hx    Stomach cancer Neg Hx    Colon polyps Neg Hx    Breast cancer Neg Hx    Ovarian cancer Neg Hx    Endometrial cancer Neg Hx     Social History   Socioeconomic History   Marital status: Married    Spouse name: Not on file   Number of children: Not on file   Years of education: Not on file   Highest education level: Not on file  Occupational History   Occupation: Home maker  Tobacco Use   Smoking status: Never   Smokeless tobacco: Never  Vaping Use   Vaping status: Never Used  Substance and Sexual Activity   Alcohol use: No    Alcohol/week: 0.0 standard drinks of alcohol   Drug use: No   Sexual activity: Not Currently    Birth control/protection: Post-menopausal    Comment: 1st intercourse 27 yo-5 partners  Other Topics Concern   Not on file  Social History Narrative   3 adopted children, 2 boys and 1 girl   Social Drivers of Corporate investment banker Strain: Not on file  Food Insecurity: Not on file  Transportation Needs: Not on file  Physical Activity: Not on file  Stress: Not on file  Social Connections: Not on file    Current Medications:  Current Outpatient Medications:    Ascorbic Acid (VITAMIN C PO), Take 1,000 mg by mouth in the morning., Disp: , Rfl:    Cholecalciferol (VITAMIN D) 50 MCG (2000 UT) tablet, Take 2,000 Units by mouth in the morning., Disp: , Rfl:    fluorouracil (EFUDEX) 5 % cream, Apply 1 Application topically daily as needed (skin spots)., Disp: , Rfl:    ibuprofen (ADVIL) 800 MG tablet, Take 1 tablet (800 mg total) by mouth every 8 (eight) hours as needed for moderate pain., Disp: 30  tablet, Rfl: 1   lidocaine (XYLOCAINE) 5 % ointment, Apply 1 Application topically 3 (three) times daily as needed for moderate pain (to the vulva)., Disp: 35.44 g, Rfl: 1   estradiol (ESTRACE VAGINAL) 0.1 MG/GM vaginal cream, Place a pea size on your fingertip and apply to the opening of the vagina. Start with nightly for 2 weeks and then twice a week at night there after. (Patient not taking: Reported on 06/16/2023), Disp: 42.5 g, Rfl: 3  Review of Symptoms: On ROS intake form, + for urinary frequency, vaginal discharge. Vulvar soreness reported. See interval HPI for GI symptoms.  Physical Exam: BP 121/60 (BP Location: Left Arm, Patient Position: Sitting)   Pulse 69   Temp 98.7 F (37.1 C) (Oral)   Resp 20   Wt 194 lb 3.2 oz (88.1 kg)   LMP 11/02/2012   SpO2 96%   BMI 30.42 kg/m  General:  Alert, oriented, no acute distress. HEENT: Normocephalic, atraumatic. Neck symmetric without masses. Sclera anicteric.  Chest: Normal work of breathing. Clear to auscultation bilaterally.   Cardiovascular: Regular rate and rhythm, no murmurs. Abdomen: Soft, nontender.  Normoactive bowel sounds. Extremities: Grossly normal range of motion.  Warm, well perfused.  No edema bilaterally. Skin: No rashes or lesions noted. Lymphatics: No cervical, supraclavicular, or inguinal adenopathy. GU: External genitalia with perineal body in midline completely healed. Gluteal incision with fold of scar but complete healing. No areas concerning for dysplasia on the vulva. Small band like adhesion noted at the introitus. Mild tenderness in this area. Recent visit with Dr. Edward Jolly with pap smear at that time-negative with HR HPV negative.  Laboratory & Radiologic Studies: MR abdomen performed on Jul 04, 2023-results pending

## 2023-06-19 ENCOUNTER — Ambulatory Visit: Payer: 59 | Admitting: Gastroenterology

## 2023-06-19 VITALS — BP 110/66 | HR 78 | Temp 98.3°F | Wt 192.0 lb

## 2023-06-19 DIAGNOSIS — K640 First degree hemorrhoids: Secondary | ICD-10-CM

## 2023-06-19 DIAGNOSIS — K648 Other hemorrhoids: Secondary | ICD-10-CM

## 2023-06-19 NOTE — Progress Notes (Signed)
Patient follow-ups today for banding of hemorrhoids    Summary of history : Summary of history :   Here today for him rectal banding history of rectal bleeding.  Called our office on 04/29/2023 for discomfort from hemorrhoids.  Colonoscopy on 08/07/2020 by Dr. Christella Hartigan hemorrhoids based on my assessment of the pictures   Two 4 to 6 mm polyps were resected from the ascending colon and cecum with a cold snare.  Which were tubular adenomas.   She has tried conservative measures but continues to have issues of pruritus rectal bleeding on and off would like to get her hemorrhoids banded.  She also complains of chronic abdominal pain which has been evaluated in the past there were concerns of mesenteric panniculitis.    First round:05/26/2023: LL column banded   Interval history    Doing well since the last visit anal pruritus and rectal bleeding has stopped completely.  No other complaints.  No  MRI/MRCP 06/16/2023 completed but not yet reported  Chaperone present in the room [facility] rectal exam performed in the presence of a chaperone. External anal findings: No Internal findings: , No masses, no blood on glove noticed.    PROCEDURE NOTE: The patient presents with symptomatic grade 1 hemorrhoids, unresponsive to maximal medical therapy, requesting rubber band ligation of his/her hemorrhoidal disease.  All risks, benefits and alternative forms of therapy were described and informed consent was obtained.  In the Left Lateral Decubitus position (if anoscopy is performed) anoscopic examination revealed grade 1 hemorrhoids in the RA and RP position(s).   The decision was made to band the RA internal hemorrhoid, and the [facility] O'Regan System was used to perform band ligation without complication.  Digital anorectal examination was then performed to assure proper positioning of the band, and to adjust the banded tissue as required.  The patient was discharged home without pain or other  issues.  Dietary and behavioral recommendations were given and (if necessary - prescriptions were given), along with follow-up instructions.  The patient will return 4 weeks for follow-up and possible additional banding as required.  No complications were encountered and the patient tolerated the procedure well.   Plan:  Avoid constipation.   Will discuss results of the MRCP at next visit not yet reported  Follow-up: 4 weeks  Dr Wyline Mood MD,MRCP [facility]) Gastroenterology/Hepatology Pager: [phone]

## 2023-06-20 ENCOUNTER — Encounter: Payer: Self-pay | Admitting: Gastroenterology

## 2023-06-26 ENCOUNTER — Ambulatory Visit
Admission: RE | Admit: 2023-06-26 | Discharge: 2023-06-26 | Disposition: A | Discharge status: Home / Self Care | Payer: 59 | Source: Ambulatory Visit | Attending: Obstetrics and Gynecology | Admitting: Obstetrics and Gynecology

## 2023-06-26 ENCOUNTER — Other Ambulatory Visit: Payer: Self-pay | Admitting: Obstetrics and Gynecology

## 2023-06-26 DIAGNOSIS — Z1231 Encounter for screening mammogram for malignant neoplasm of breast: Secondary | ICD-10-CM

## 2023-06-26 DIAGNOSIS — M7989 Other specified soft tissue disorders: Secondary | ICD-10-CM

## 2023-06-26 DIAGNOSIS — R2231 Localized swelling, mass and lump, right upper limb: Secondary | ICD-10-CM

## 2023-07-01 ENCOUNTER — Ambulatory Visit
Admission: RE | Admit: 2023-07-01 | Discharge: 2023-07-01 | Disposition: A | Discharge status: Home / Self Care | Payer: 59 | Source: Ambulatory Visit | Attending: Obstetrics and Gynecology | Admitting: Obstetrics and Gynecology

## 2023-07-01 DIAGNOSIS — R2231 Localized swelling, mass and lump, right upper limb: Secondary | ICD-10-CM

## 2023-07-01 DIAGNOSIS — M7989 Other specified soft tissue disorders: Secondary | ICD-10-CM

## 2023-07-22 ENCOUNTER — Ambulatory Visit: Payer: 59 | Admitting: Gastroenterology

## 2023-07-22 VITALS — BP 115/62 | HR 70 | Temp 98.1°F | Wt 196.3 lb

## 2023-07-22 DIAGNOSIS — R1011 Right upper quadrant pain: Secondary | ICD-10-CM

## 2023-07-22 DIAGNOSIS — Z860101 Personal history of adenomatous and serrated colon polyps: Secondary | ICD-10-CM

## 2023-07-22 DIAGNOSIS — R159 Full incontinence of feces: Secondary | ICD-10-CM

## 2023-07-22 DIAGNOSIS — K640 First degree hemorrhoids: Secondary | ICD-10-CM

## 2023-07-22 DIAGNOSIS — R935 Abnormal findings on diagnostic imaging of other abdominal regions, including retroperitoneum: Secondary | ICD-10-CM

## 2023-07-22 DIAGNOSIS — R599 Enlarged lymph nodes, unspecified: Secondary | ICD-10-CM

## 2023-07-22 DIAGNOSIS — K648 Other hemorrhoids: Secondary | ICD-10-CM

## 2023-07-22 DIAGNOSIS — R140 Abdominal distension (gaseous): Secondary | ICD-10-CM

## 2023-07-22 MED ORDER — LOPERAMIDE HCL 2 MG PO CAPS: 2.0000 mg | ORAL_CAPSULE | ORAL | 3 refills | Status: DC | PRN

## 2023-07-22 NOTE — Addendum Note (Signed)
Addended by: Adela Ports on: 07/22/2023 02:25 PM   Modules accepted: Orders

## 2023-07-22 NOTE — Progress Notes (Signed)
Patient follow-ups today for banding of hemorrhoids    Summary of history : Here today for him rectal banding history of rectal bleeding.  Called our office on 04/29/2023 for discomfort from hemorrhoids.  Colonoscopy on 08/07/2020 by Dr. Christella Hartigan hemorrhoids based on my assessment of the pictures   Two 4 to 6 mm polyps were resected from the ascending colon and cecum with a cold snare.  Which were tubular adenomas.   She has tried conservative measures but continues to have issues of pruritus rectal bleeding on and off would like to get her hemorrhoids banded.  She also complains of chronic abdominal pain which has been evaluated in the past there were concerns of mesenteric panniculitis   First round:05/26/2023: LL column banded  Second round:06/19/2023: RA column banded   Interval history     Previously had an MRI which showed features of panniculitis repeat MRI in December 2024.  She has previously had right upper quadrant discomfort.  06/16/2023: MRI abdomen with MRCP :  1. Normal appearance of the pancreas on today's examination. Nopancreatic ductal dilatation or surrounding inflammatory changes.  2. Unchanged chronic appearance of minimal fat stranding in the small bowel mesentery and prominent lymph nodes, consistent with chronic sequelae of mesenteritis ("misty mesentery"). No specific evidence of active or ongoing inflammation at this time  She complains of discomfort in the right upper quadrant which she has had for over 3 years.  Has episodes of occasional fecal incontinence.  Denies any diarrhea.  Denies any constipation.  Rectal bleeding is also significantly improved.  Denies any weight loss fever. Chaperone present in the room [facility] Digital rectal exam performed in the presence of a chaperone. External anal findings: Normal Internal findings: Normal, No masses, no blood on glove noticed.    PROCEDURE NOTE: The patient presents with symptomatic grade 1 hemorrhoids,  unresponsive to maximal medical therapy, requesting rubber band ligation of his/her hemorrhoidal disease.  All risks, benefits and alternative forms of therapy were described and informed consent was obtained.  In the Left Lateral Decubitus position (if anoscopy is performed) anoscopic examination revealed grade 1 hemorrhoids in the RP position(s).   The decision was made to band the RP internal hemorrhoid, and the [facility] O'Regan System was used to perform band ligation without complication.  Digital anorectal examination was then performed to assure proper positioning of the band, and to adjust the banded tissue as required.  The patient was discharged home without pain or other issues.  Dietary and behavioral recommendations were given and (if necessary - prescriptions were given), along with follow-up instructions  No complications were encountered and the patient tolerated the procedure well.   Plan:  Avoid constipation.   Mesenteric panniculitis : Stable MRI findings no active inflammation.  No B symptoms.  Despite reassuring MRI the patient complains of significant discomfort in the right side of her abdomen which she is very concerned about.  I have a low suspicion that this is secondary to neoplasm such as lymphoma but I would like to get a second opinion from oncology if this is something we need to continue to monitor or we need to biopsy or does not require any further follow-up as per their opinion.  The discomfort she has could also be related to adhesions or scar tissue.  In addition she has some incontinence we will commence her on Imodium and she has a lot of gas we will place her on charcoal tablets. Does not require any further hemorrhoidal banding and  that she is symptomatic  Follow-up: 3 to 4 months  Dr Wyline Mood MD,MRCP [facility]) Gastroenterology/Hepatology Pager: [phone]

## 2023-07-22 NOTE — Patient Instructions (Addendum)
Please take charcoal tablets.  For activated charcoal --For oral dosage form (powder): For treatment of poisoning: Treatment with one dose: Adults and teenagers--Dose is usually 25 to 100 grams mixed with water. Children 1 through 60 years of age--Dose is usually 25 to 50 grams mixed with water, or the dose may be based on body weight. It may be 0.5 to 1 gram per kilogram (kg) (0.23 to 0.45 gram per pound) of body weight mixed with water. Children up to 1 year of age--Dose is usually 10 to 25 grams mixed with water, or the dose may be based on body weight. It may be 0.5 to 1 gram per kg (0.23 to 0.45 gram per pound) of body weight mixed with water. Treatment with more than one dose: Adults and teenagers--At first, the dose is 50 to 100 grams. Then the dose may be 12.5 grams given every hour, 25 grams given every two hours, or 50 grams given every four hours. Each dose should be mixed with water.

## 2023-07-24 ENCOUNTER — Ambulatory Visit: Payer: 59 | Admitting: Gastroenterology

## 2023-07-29 ENCOUNTER — Inpatient Hospital Stay: Payer: 59 | Attending: Psychiatry | Admitting: Internal Medicine

## 2023-07-29 ENCOUNTER — Inpatient Hospital Stay: Payer: 59

## 2023-07-29 ENCOUNTER — Encounter: Payer: Self-pay | Admitting: Internal Medicine

## 2023-07-29 VITALS — BP 118/71 | HR 60 | Temp 98.0°F | Resp 17 | Ht 67.0 in | Wt 193.8 lb

## 2023-07-29 DIAGNOSIS — R61 Generalized hyperhidrosis: Secondary | ICD-10-CM | POA: Diagnosis not present

## 2023-07-29 DIAGNOSIS — R109 Unspecified abdominal pain: Secondary | ICD-10-CM | POA: Diagnosis not present

## 2023-07-29 DIAGNOSIS — N951 Menopausal and female climacteric states: Secondary | ICD-10-CM | POA: Insufficient documentation

## 2023-07-29 DIAGNOSIS — R590 Localized enlarged lymph nodes: Secondary | ICD-10-CM | POA: Insufficient documentation

## 2023-07-29 NOTE — Progress Notes (Signed)
[facility] CONSULT NOTE  Patient Care Team: Hannah Beat, MD as PCP - General (Family Medicine) Patton Salles, MD as Consulting Physician (Obstetrics and Gynecology) Earna Coder, MD as Consulting Physician (Oncology)  CHIEF COMPLAINTS/PURPOSE OF CONSULTATION: Lymphadenopathy  Oncology History   No history exists.   1. Encapsulated misty appearance of the jejunal mesentery with prominent mesenteric lymph nodes similar prior examination, findings are nonspecific but can be seen in the setting of mesenteric panniculitis and may be a cause of patient's abdominal pain. 2. Somewhat encapsulated appearance of the pancreas with loss of arborization of the pancreas, findings are nonspecific and possibly within normal limits for this patient however the finding can be seen with autoimmune pancreatopathy, consider GI consultation. 3. Small hiatal hernia.     Electronically Signed   By: Maudry Mayhew M.D.   On: 03/28/2023 16:08 Jun 2023- 1. Normal appearance of the pancreas on today's examination. No pancreatic ductal dilatation or surrounding inflammatory changes. 2. Unchanged chronic appearance of minimal fat stranding in the small bowel mesentery and prominent lymph nodes, consistent with chronic sequelae of mesenteritis ("misty mesentery"). No specific evidence of active or ongoing inflammation at this time. 3. Colonic diverticulosis. 4. Small hiatal hernia.  HISTORY OF PRESENTING ILLNESS: Patient ambulating-independently. Alone.  Jennifer Velez 58 y.o.  female referred to [place] for abdominal lymph nodes noted on imaging.  Patient as been having right sided abdominal pain for about 1-2 year.  Right upper quadrant pain-chronic intermittent comes and goes.  Lasts an hour also multiple episodes in a day; however pain is worse at night lying in bed.   Patient had extensive GI workup including imaging for the last 2 years-including CT  scan-s; MRI abdomen endoscopies without any obvious causes.  Also treat for mesenteritis with antibiotics in the past.  On the most recent MRI noted to have small bowel chronic fat stranding and prominent lymph nodes.  Patient has been referred to [place] for further evaluation recommendations  Night sweats:from menopause Weight loss;none  Review of Systems  Constitutional:  Positive for malaise/fatigue. Negative for chills, diaphoresis, fever and weight loss.  HENT:  Negative for nosebleeds and sore throat.   Eyes:  Negative for double vision.  Respiratory:  Negative for cough, hemoptysis, sputum production, shortness of breath and wheezing.   Cardiovascular:  Negative for chest pain, palpitations, orthopnea and leg swelling.  Gastrointestinal:  Positive for abdominal pain. Negative for blood in stool, constipation, diarrhea, heartburn, melena, nausea and vomiting.  Genitourinary:  Negative for dysuria, frequency and urgency.  Musculoskeletal:  Negative for back pain and joint pain.  Skin: Negative.  Negative for itching and rash.  Neurological:  Negative for dizziness, tingling, focal weakness, weakness and headaches.  Endo/Heme/Allergies:  Does not bruise/bleed easily.  Psychiatric/Behavioral:  Negative for depression. The patient is not nervous/anxious and does not have insomnia.     MEDICAL HISTORY:  Past Medical History:  Diagnosis Date   CIN III (cervical intraepithelial neoplasia III)    Endometriosis    Fibroid    GERD (gastroesophageal reflux disease)    IBS (irritable bowel syndrome)    Iron excess    Osteoporosis 05/2017   T score -2.0 prior DEXA 2016 T score -2.8 AP spine    SURGICAL HISTORY: Past Surgical History:  Procedure Laterality Date   CERVICAL BIOPSY  W/ LOOP ELECTRODE EXCISION  1999   CO2 LASER APPLICATION N/A 06/04/2022   Procedure: CO2 LASER APPLICATION;  Surgeon:  Clide Cliff, MD;  Location: [facility];  Service: Gynecology;  Laterality: N/A;   COLPOSCOPY      EXPLORATORY LAPAROTOMY  2004   myomectomy and excision endometriosis   INCISION AND DRAINAGE ABSCESS N/A 07/30/2022   Procedure: INCISION AND DRAINAGE HEMATOMA OF VULVA WITH PACKING;  Surgeon: Clide Cliff, MD;  Location: [facility];  Service: Gynecology;  Laterality: N/A;   PELVIC LAPAROSCOPY  1992   x2, for endometriosis   SKIN CANCER EXCISION     Squamous cell   VULVECTOMY N/A 06/04/2022   Procedure: WIDE EXCISION VULVECTOMY;  Surgeon: Clide Cliff, MD;  Location: [facility];  Service: Gynecology;  Laterality: N/A;    SOCIAL HISTORY: Social History   Socioeconomic History   Marital status: Married    Spouse name: Not on file   Number of children: Not on file   Years of education: Not on file   Highest education level: Not on file  Occupational History   Occupation: Home maker  Tobacco Use   Smoking status: Never   Smokeless tobacco: Never  Vaping Use   Vaping status: Never Used  Substance and Sexual Activity   Alcohol use: No    Alcohol/week: 0.0 standard drinks of alcohol   Drug use: No   Sexual activity: Not Currently    Birth control/protection: Post-menopausal    Comment: 1st intercourse 45 yo-5 partners  Other Topics Concern   Not on file  Social History Narrative   3 adopted children, 2 boys and 1 girl   Social Drivers of Corporate investment banker Strain: Not on file  Food Insecurity: Not on file  Transportation Needs: Not on file  Physical Activity: Not on file  Stress: Not on file  Social Connections: Not on file  Intimate Partner Violence: Not on file    FAMILY HISTORY: Family History  Problem Relation Age of Onset   Diverticulitis Mother    Alzheimer's disease Mother    Alzheimer's disease Maternal Grandmother    Alzheimer's disease Maternal Aunt    Alzheimer's disease Maternal Aunt    Ulcerative colitis Maternal Aunt    Ulcerative colitis Cousin    Colon cancer Neg Hx    Esophageal cancer Neg Hx    Rectal cancer Neg  Hx    Stomach cancer Neg Hx    Colon polyps Neg Hx    Breast cancer Neg Hx    Ovarian cancer Neg Hx    Endometrial cancer Neg Hx     ALLERGIES:  is allergic to sulfonamide derivatives.  MEDICATIONS:  Current Outpatient Medications  Medication Sig Dispense Refill   Ascorbic Acid (VITAMIN C PO) Take 1,000 mg by mouth in the morning.     Cholecalciferol (VITAMIN D) 50 MCG (2000 UT) tablet Take 2,000 Units by mouth in the morning.     estradiol (ESTRACE VAGINAL) 0.1 MG/GM vaginal cream Place a pea size on your fingertip and apply to the opening of the vagina. Start with nightly for 2 weeks and then twice a week at night there after. 42.5 g 3   fluorouracil (EFUDEX) 5 % cream Apply 1 Application topically daily as needed (skin spots).     lidocaine (XYLOCAINE) 5 % ointment Apply 1 Application topically 3 (three) times daily as needed for moderate pain (to the vulva). 35.44 g 1   ibuprofen (ADVIL) 800 MG tablet Take 1 tablet (800 mg total) by mouth every 8 (eight) hours as needed for moderate pain. (Patient not taking: Reported on 07/29/2023)  30 tablet 1   loperamide (IMODIUM) 2 MG capsule Take 1 capsule (2 mg total) by mouth as needed for diarrhea or loose stools. (Patient not taking: Reported on 07/29/2023) 30 capsule 3   No current facility-administered medications for this visit.    PHYSICAL EXAMINATION: ECOG PERFORMANCE STATUS: 0 - Asymptomatic  Vitals:   07/29/23 1114  BP: 118/71  Pulse: 60  Resp: 17  Temp: 98 F (36.7 C)  SpO2: 97%   Filed Weights   07/29/23 1114  Weight: 193 lb 12.8 oz (87.9 kg)    Physical Exam Vitals and nursing note reviewed.  HENT:     Head: Normocephalic and atraumatic.     Mouth/Throat:     Pharynx: Oropharynx is clear.  Eyes:     Extraocular Movements: Extraocular movements intact.     Pupils: Pupils are equal, round, and reactive to light.  Cardiovascular:     Rate and Rhythm: Normal rate and regular rhythm.  Pulmonary:     Comments:  Decreased breath sounds bilaterally.  Abdominal:     Palpations: Abdomen is soft.  Musculoskeletal:        General: Normal range of motion.     Cervical back: Normal range of motion.  Skin:    General: Skin is warm.  Neurological:     General: No focal deficit present.     Mental Status: She is alert and oriented to person, place, and time.  Psychiatric:        Behavior: Behavior normal.        Judgment: Judgment normal.     LABORATORY DATA:  I have reviewed the data as listed Lab Results  Component Value Date   WBC 6.3 04/28/2023   HGB 14.5 04/28/2023   HCT 42.8 04/28/2023   MCV 97.5 04/28/2023   PLT 296 04/28/2023   Recent Labs    07/30/22 1205 04/03/23 1307  NA 139  --   K 3.7  --   CL 103  --   CO2 27  --   GLUCOSE 100*  --   BUN 19  --   CREATININE 0.73  --   CALCIUM 8.8*  --   GFRNONAA >60  --   PROT  --  6.4  ALBUMIN  --  4.1  AST  --  17  ALT  --  18  ALKPHOS  --  84  BILITOT  --  0.3  BILIDIR  --  <0.10    RADIOGRAPHIC STUDIES: I have personally reviewed the radiological images as listed and agreed with the findings in the report. MM 3D DIAGNOSTIC MAMMOGRAM BILATERAL BREAST Result Date: 07/01/2023 CLINICAL DATA:  58 year old female presenting for evaluation of a lump in the right axilla let was previously irritated but now is no longer tender and has decreased in size. EXAM: DIGITAL DIAGNOSTIC BILATERAL MAMMOGRAM WITH TOMOSYNTHESIS AND CAD; [place] AXILLARY RIGHT TECHNIQUE: Bilateral digital diagnostic mammography and breast tomosynthesis was performed. The images were evaluated with computer-aided detection. ; Targeted ultrasound examination of the right axilla was performed. COMPARISON:  Previous exam(s). ACR Breast Density Category b: There are scattered areas of fibroglandular density. FINDINGS: Mammogram: Right breast: A skin BB marks the palpable site of concern reported by the patient in the right axilla. A spot tangential view of this area was  performed in addition to standard views. There is no definite new abnormality at the palpable site or elsewhere in the right breast suggest presence of malignancy. Left breast: No suspicious mass, distortion, or  microcalcifications are identified to suggest presence of malignancy. On physical exam at the site of concern reported by the patient in the right axilla I see a small slightly raised skin colored nodule. Ultrasound: Targeted ultrasound performed at the palpable site of concern in the right axilla demonstrating a tiny cystic space within the skin measuring 0.2 x 0.1 x 0.2 cm, possibly the remnants of a sebaceous cyst or epidermal inclusion cyst. There is no mass or fluid within the sub adjacent breast tissue. IMPRESSION: 1. At the palpable site of concern in the right axilla there is a tiny cystic space within the skin measuring 0.2 cm, possibly the remnants of a sebaceous cyst or epidermal inclusion cyst. 2. No mammographic evidence of malignancy in the bilateral breasts. RECOMMENDATION: 1. Clinical follow-up as needed for the small skin cyst in the right axilla. 2.  Screening mammogram in one year.(Code:SM-B-01Y) I have discussed the findings and recommendations with the patient. If applicable, a reminder letter will be sent to the patient regarding the next appointment. BI-RADS CATEGORY  2: Benign. Electronically Signed   By: Emmaline Kluver M.D.   On: 07/01/2023 13:11   [place] AXILLA RIGHT Result Date: 07/01/2023 CLINICAL DATA:  58 year old female presenting for evaluation of a lump in the right axilla let was previously irritated but now is no longer tender and has decreased in size. EXAM: DIGITAL DIAGNOSTIC BILATERAL MAMMOGRAM WITH TOMOSYNTHESIS AND CAD; [place] AXILLARY RIGHT TECHNIQUE: Bilateral digital diagnostic mammography and breast tomosynthesis was performed. The images were evaluated with computer-aided detection. ; Targeted ultrasound examination of the right axilla was performed. COMPARISON:   Previous exam(s). ACR Breast Density Category b: There are scattered areas of fibroglandular density. FINDINGS: Mammogram: Right breast: A skin BB marks the palpable site of concern reported by the patient in the right axilla. A spot tangential view of this area was performed in addition to standard views. There is no definite new abnormality at the palpable site or elsewhere in the right breast suggest presence of malignancy. Left breast: No suspicious mass, distortion, or microcalcifications are identified to suggest presence of malignancy. On physical exam at the site of concern reported by the patient in the right axilla I see a small slightly raised skin colored nodule. Ultrasound: Targeted ultrasound performed at the palpable site of concern in the right axilla demonstrating a tiny cystic space within the skin measuring 0.2 x 0.1 x 0.2 cm, possibly the remnants of a sebaceous cyst or epidermal inclusion cyst. There is no mass or fluid within the sub adjacent breast tissue. IMPRESSION: 1. At the palpable site of concern in the right axilla there is a tiny cystic space within the skin measuring 0.2 cm, possibly the remnants of a sebaceous cyst or epidermal inclusion cyst. 2. No mammographic evidence of malignancy in the bilateral breasts. RECOMMENDATION: 1. Clinical follow-up as needed for the small skin cyst in the right axilla. 2.  Screening mammogram in one year.(Code:SM-B-01Y) I have discussed the findings and recommendations with the patient. If applicable, a reminder letter will be sent to the patient regarding the next appointment. BI-RADS CATEGORY  2: Benign. Electronically Signed   By: Emmaline Kluver M.D.   On: 07/01/2023 13:11     Intra-abdominal lymphadenopathy # Mild nonspecific intra-abdominal lymphadenopathy  [MRI dec 2024; GI-Dr.Anna] I had a long discussion with the patient regarding the multiple causes of lymphadenopathy includes: proliferation [malignant vs-reactive-  infective/inflammatory]. However, clinically suggestive of reactive rather than any malignant appearing lymph nodes.  # I clinically  doubt if the small lymph nodes is causing patient abdominal pain. Will review imaging/discuss at tumor conference.   # Abdominal pain-unclear etiology defer to GI   Thank you Dr.Anna for allowing me to participate in the care of your pleasant patient. Please do not hesitate to contact me with questions or concerns in the interim.  # DISPOSITION:  # No labs today # follow up TBD- Dr.B  Above plan of care was discussed with patient/family in detail.  My contact information was given to the patient/family.     Earna Coder, MD 07/29/2023 11:53 AM

## 2023-07-29 NOTE — Assessment & Plan Note (Addendum)
#   Mild nonspecific intra-abdominal lymphadenopathy  [MRI dec 2024; GI-Dr.Anna] I had a long discussion with the patient regarding the multiple causes of lymphadenopathy includes: proliferation [malignant vs-reactive- infective/inflammatory]. However, clinically suggestive of reactive rather than any malignant appearing lymph nodes.  # I clinically doubt if the small lymph nodes is causing patient abdominal pain. Will review imaging/discuss at tumor conference.   # Abdominal pain-unclear etiology defer to GI   Thank you Dr.Anna for allowing me to participate in the care of your pleasant patient. Please do not hesitate to contact me with questions or concerns in the interim.  # DISPOSITION:  # No labs today # follow up TBD- Dr.B

## 2023-07-29 NOTE — Progress Notes (Signed)
Has been having right sided abdominal pain for about 1 year. Pain is worse at night lying in bed. Constant sharp pain Had a recent MRI scan. Dr Tobi Bastos said there might be lymph nodes noted on MRI. Has recent banding of hemorrhoids. Had precancerous abnormal pap and surgery in past year. No blood in stool. Has a lot of flatus. Appetite is good. Vegetarian. Denies nausea.

## 2023-08-07 ENCOUNTER — Other Ambulatory Visit: Payer: 59

## 2023-08-07 ENCOUNTER — Other Ambulatory Visit: Payer: Self-pay

## 2023-08-07 ENCOUNTER — Other Ambulatory Visit: Payer: Self-pay | Admitting: Internal Medicine

## 2023-08-07 DIAGNOSIS — R1084 Generalized abdominal pain: Secondary | ICD-10-CM

## 2023-08-07 DIAGNOSIS — R590 Localized enlarged lymph nodes: Secondary | ICD-10-CM

## 2023-08-07 NOTE — Progress Notes (Signed)
 Labs entered.

## 2023-08-07 NOTE — Progress Notes (Signed)
 Reviewed the imaging at the cancer conference- clinically suspicious for non-malignant LN- [dec 2024- to 2022- comparison]. However, reasonable to do follow up imaging in 1 year.   Discussed above with pt- pt in agreement  In 12 months- MD: labs- cbc/cmp; LDH; CT AP prior-GB  Dr.Anna- FYI

## 2023-10-07 ENCOUNTER — Other Ambulatory Visit: Payer: Self-pay | Admitting: Obstetrics and Gynecology

## 2023-10-07 DIAGNOSIS — M8580 Other specified disorders of bone density and structure, unspecified site: Secondary | ICD-10-CM

## 2023-10-07 NOTE — Progress Notes (Signed)
 Order for Dexa at [facility].

## 2023-10-14 ENCOUNTER — Telehealth: Payer: Self-pay | Admitting: Gastroenterology

## 2023-10-14 NOTE — Telephone Encounter (Signed)
 I called the patient to reschedule her appointment due to Dr. Antony Baumgartner being unavailable in the office. The call was answered by someone who informed me that the patient is not available at this time and that they will have her call us  back.

## 2023-10-20 ENCOUNTER — Ambulatory Visit: Payer: 59 | Admitting: Gastroenterology

## 2023-10-20 ENCOUNTER — Ambulatory Visit (HOSPITAL_BASED_OUTPATIENT_CLINIC_OR_DEPARTMENT_OTHER)
Admission: RE | Admit: 2023-10-20 | Discharge: 2023-10-20 | Disposition: A | Discharge status: Home / Self Care | Source: Ambulatory Visit | Attending: Obstetrics and Gynecology | Admitting: Obstetrics and Gynecology

## 2023-10-20 ENCOUNTER — Encounter: Payer: Self-pay | Admitting: Obstetrics and Gynecology

## 2023-10-20 DIAGNOSIS — M8580 Other specified disorders of bone density and structure, unspecified site: Secondary | ICD-10-CM | POA: Insufficient documentation

## 2023-10-22 ENCOUNTER — Other Ambulatory Visit (HOSPITAL_BASED_OUTPATIENT_CLINIC_OR_DEPARTMENT_OTHER)

## 2023-12-12 ENCOUNTER — Encounter: Payer: Self-pay | Admitting: Gynecologic Oncology

## 2023-12-15 ENCOUNTER — Inpatient Hospital Stay: Payer: 59 | Attending: Psychiatry | Admitting: Psychiatry

## 2023-12-15 ENCOUNTER — Encounter: Payer: Self-pay | Admitting: Psychiatry

## 2023-12-15 VITALS — BP 103/51 | HR 67 | Temp 98.4°F | Resp 16 | Ht 67.0 in | Wt 191.2 lb

## 2023-12-15 DIAGNOSIS — N94819 Vulvodynia, unspecified: Secondary | ICD-10-CM | POA: Insufficient documentation

## 2023-12-15 DIAGNOSIS — Z9079 Acquired absence of other genital organ(s): Secondary | ICD-10-CM | POA: Insufficient documentation

## 2023-12-15 DIAGNOSIS — Z86002 Personal history of in-situ neoplasm of other and unspecified genital organs: Secondary | ICD-10-CM | POA: Diagnosis not present

## 2023-12-15 DIAGNOSIS — D071 Carcinoma in situ of vulva: Secondary | ICD-10-CM

## 2023-12-15 NOTE — Patient Instructions (Signed)
 It was a pleasure to see you in clinic today. - normal exam today - Return visit planned for 6 months  Thank you very much for allowing me to provide care for you today.  I appreciate your confidence in choosing our Gynecologic Oncology team at [facility].  If you have any questions about your visit today please call our office or send [place] a MyChart message and we will get back to you as soon as possible.

## 2023-12-15 NOTE — Progress Notes (Signed)
 Gynecologic Oncology Return Clinic Visit  Date of Service: 12/15/2023 Referring Provider: Greta Leatherwood, MD   Assessment & Plan: Jennifer Velez is a 58 y.o. 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 S/p wound incision and debridement on 07/30/22, now with complete healing by secondary intention.  VIN3: - Recommend close surveillance with q92mo exams x 6yrs then annually. Recent pap from Sept 2024 negative, HR HPV neg - Exam NED today. - Signs/symptoms of recurrence reviewed  Vulvodynia: -Topical lidocaine  prn - Plans to resume vaginal estrogen more frequently (recommend 2-3x/wk) as she feels this helps.   RTC in 6 mo.   Jennifer Flies, MD Gynecologic Oncology  Medical Decision Making I personally spent  TOTAL 20 minutes face-to-face and non-face-to-face in the care of this patient, which includes all pre, intra, and post visit time on the date of service.  ----------------------- Reason for Visit: Follow-up VIN  Treatment history: Patient noted to have 4 x 2 mm raised area on right vulva at time of annual exam on 03/13/2022. 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 A colposcopy was performed on 04/04/2022 and noted a raised area on the right perineum and a new biopsy obtained which showed VIN 3. She underwent simple partial vulvectomy with rhomboid rotational flap and CO2 laser ablation on 06/04/2022.  Final pathology showing VIN 3, negative margins. Postop course complicated by wound cellulitis requiring antibiotics x2 followed by incision and debridement of left gluteal incision from flap (07/30/22), packed WTD to close by secondary intention.  Interval History: Uses lidocaine  prior to estrogen application and that seems to help.  They have brought her mom home since march from memory care. And her husband is helping  with his parents. Making things harder to coordinate. Using the estrogen once every 3 weeks. No new lesions, itching or bleeding.   Past Medical/Surgical History: Past Medical History:  Diagnosis Date   CIN III (cervical intraepithelial neoplasia III)    Endometriosis    Fibroid    GERD (gastroesophageal reflux disease)    IBS (irritable bowel syndrome)    Iron excess    Osteoporosis 05/2017   T score -2.0 prior DEXA 2016 T score -2.8 AP spine    Past Surgical History:  Procedure Laterality Date   CERVICAL BIOPSY  W/ LOOP ELECTRODE EXCISION  1999   CO2 LASER APPLICATION N/A 06/04/2022   Procedure: CO2 LASER APPLICATION;  Surgeon: Jennifer Flies, MD;  Location: [facility];  Service: Gynecology;  Laterality: N/A;   COLPOSCOPY     EXPLORATORY LAPAROTOMY  2004   myomectomy and excision endometriosis   INCISION AND DRAINAGE ABSCESS N/A 07/30/2022   Procedure: INCISION AND DRAINAGE HEMATOMA OF VULVA WITH PACKING;  Surgeon: Jennifer Flies, MD;  Location: [facility];  Service: Gynecology;  Laterality: N/A;   PELVIC LAPAROSCOPY  1992   x2, for endometriosis   SKIN CANCER EXCISION     Squamous cell   VULVECTOMY N/A 06/04/2022   Procedure: WIDE EXCISION VULVECTOMY;  Surgeon: Jennifer Flies, MD;  Location: [facility];  Service: Gynecology;  Laterality: N/A;    Family History  Problem Relation Age of Onset   Diverticulitis Mother    Alzheimer's disease Mother    Alzheimer's disease Maternal Grandmother    Alzheimer's disease Maternal Aunt    Alzheimer's disease Maternal Aunt    Ulcerative colitis Maternal Aunt  Ulcerative colitis Cousin    Colon cancer Neg Hx    Esophageal cancer Neg Hx    Rectal cancer Neg Hx    Stomach cancer Neg Hx    Colon polyps Neg Hx    Breast cancer Neg Hx    Ovarian cancer Neg Hx    Endometrial cancer Neg Hx     Social History   Socioeconomic History   Marital status: Married    Spouse name: Not on file   Number of children: Not on  file   Years of education: Not on file   Highest education level: Not on file  Occupational History   Occupation: Home maker  Tobacco Use   Smoking status: Never   Smokeless tobacco: Never  Vaping Use   Vaping status: Never Used  Substance and Sexual Activity   Alcohol use: No    Alcohol/week: 0.0 standard drinks of alcohol   Drug use: No   Sexual activity: Not Currently    Birth control/protection: Post-menopausal    Comment: 1st intercourse 16 yo-5 partners  Other Topics Concern   Not on file  Social History Narrative   3 adopted children, 2 boys and 1 girl   Social Drivers of Corporate investment banker Strain: Not on file  Food Insecurity: Not on file  Transportation Needs: Not on file  Physical Activity: Not on file  Stress: Not on file  Social Connections: Not on file    Current Medications:  Current Outpatient Medications:    Ascorbic Acid (VITAMIN C PO), Take 1,000 mg by mouth in the morning., Disp: , Rfl:    Cholecalciferol (VITAMIN D ) 50 MCG (2000 UT) tablet, Take 2,000 Units by mouth in the morning., Disp: , Rfl:    estradiol  (ESTRACE  VAGINAL) 0.1 MG/GM vaginal cream, Place a pea size on your fingertip and apply to the opening of the vagina. Start with nightly for 2 weeks and then twice a week at night there after., Disp: 42.5 g, Rfl: 3   fluorouracil (EFUDEX) 5 % cream, Apply 1 Application topically daily as needed (skin spots)., Disp: , Rfl:    ibuprofen  (ADVIL ) 800 MG tablet, Take 1 tablet (800 mg total) by mouth every 8 (eight) hours as needed for moderate pain. (Patient not taking: Reported on 07/29/2023), Disp: 30 tablet, Rfl: 1   lidocaine  (XYLOCAINE ) 5 % ointment, Apply 1 Application topically 3 (three) times daily as needed for moderate pain (to the vulva)., Disp: 35.44 g, Rfl: 1   loperamide  (IMODIUM ) 2 MG capsule, Take 1 capsule (2 mg total) by mouth as needed for diarrhea or loose stools. (Patient not taking: Reported on 07/29/2023), Disp: 30 capsule,  Rfl: 3  Review of Symptoms: ROS positive for: vulvar soreness  Physical Exam: BP (!) 103/51 (BP Location: Left Arm, Patient Position: Sitting)   Pulse 67   Temp 98.4 F (36.9 C) (Oral)   Resp 16   Ht 5' 7 (1.702 m)   Wt 191 lb 3.2 oz (86.7 kg)   LMP 11/02/2012   SpO2 99%   BMI 29.95 kg/m  General: Alert, oriented, no acute distress. HEENT: Normocephalic, atraumatic. Neck symmetric without masses. Sclera anicteric.  Chest: Normal work of breathing. Clear to auscultation bilaterally.   Cardiovascular: Regular rate and rhythm, no murmurs. Abdomen: Soft, nontender.  Normoactive bowel sounds. Extremities: Grossly normal range of motion.  Warm, well perfused.  No edema bilaterally. Skin: No rashes or lesions noted. Lymphatics: No cervical, supraclavicular, or inguinal adenopathy. GU: External genitalia with  perineal body in midline completely healed. Gluteal incision with fold of scar but complete healing. No areas concerning for dysplasia on the vulva. Small band like adhesion noted at the introitus. Mild tenderness in this area. Speculum exam with normal vaginal mucosa and cervix. Bimanual exam with normal cervix and uterus. Exam chaperoned by Kimberly [place], CMA   Laboratory & Radiologic Studies: none

## 2024-02-09 ENCOUNTER — Telehealth: Payer: Self-pay

## 2024-02-09 NOTE — Telephone Encounter (Signed)
 Office received a refill request from pt's pharmacy for Estradiol  cream  0.1mg .  Ms.Eastwood verified she is out and needs this sent to her pharmacy.   Message sent to Dr. Eldonna

## 2024-02-10 ENCOUNTER — Other Ambulatory Visit: Payer: Self-pay | Admitting: Psychiatry

## 2024-02-10 DIAGNOSIS — D071 Carcinoma in situ of vulva: Secondary | ICD-10-CM

## 2024-02-10 MED ORDER — ESTRADIOL 0.1 MG/GM VA CREA: TOPICAL_CREAM | VAGINAL | 11 refills | Status: DC

## 2024-02-10 NOTE — Telephone Encounter (Signed)
 Refill request came across the fax machine over the weekend. I can give it to [facility] tomorrow to fill out or Rx can be sent electronically.

## 2024-02-20 ENCOUNTER — Telehealth: Payer: Self-pay | Admitting: Physician Assistant

## 2024-02-20 NOTE — Telephone Encounter (Signed)
 I spoke with patient.  I previously saw her at [facility] in [place].  She is requesting follow-up appointment with me at [facility] for persistent abdominal pain.  Please schedule follow-up OV with me next available at [facility]. Ellouise Console, PA-C

## 2024-03-03 NOTE — Progress Notes (Unsigned)
 Jennifer Console, PA-C [address] [place], [place]  [address] Phone: [phone]   Primary Care Physician: Watt Mirza, MD  Primary Gastroenterologist:  Jennifer Console, PA-C / Elspeth Naval, MD   Chief Complaint: Follow-up RUQ abdominal pain       HPI:   Jennifer Velez is a 58 y.o. female presents for follow-up of chronic RUQ abdominal pain with possible IBS.  I previously saw pt. At [facility].  Before that she was followed by Dr. Teressa at [facility].  She underwent internal hemorrhoid banding x 3 by Dr. Therisa between 05/2023 and 07/2023.  She has no more rectal bleeding or hemorrhoid symptoms.  She has occasional episode of mild fecal incontinence and mild fecal leakage.  She denies diarrhea or constipation.  She has normal bowel movement once daily.    07/2023 patient saw Dr. Rennie oncologist to follow-up with enlarged lymph nodes on MRI.  Her results were discussed at the tumor board.  It was thought that the mild nonspecific intra-abdominal lymphadenopathy was reactive rather than malignant.  It was doubtful that the lymph nodes were causing abdominal pain.  It was recommended for patient to have follow-up abdominal CT in 1 year.  She was advised to follow-up with GI for persistent intermittent abdominal pain.  Current symptoms:  Chronic, intermittent Right Upper Quadrant abdominal pain for about 3 years.  She reports worsening RUQ pain in the past several months.  No RUQ or abdominal pain today.  She describes it as a poking sharp sensation.  Occasional itching over the area.  No previous history of shingles and she has had shingles vaccine.  RUQ pain can last all day and into the night when it flares up.  It is not affected by eating or drinking.  Does not seem to be affected by movement.  She has no nausea or vomiting.  Admits to increased abdominal bloating and gas.  Denies diarrhea or constipation.  Seems to be random.  She is having normal formed stool daily.   Diagnosed with IBS many years ago and had diarrhea back then, but none recently.  She admits to increased stress in the past few years.  Denies unintentional weight loss.  Appetite is good. Vegetarian. Patient had extensive GI workup for the last 3 years-including CT scan-s; MRI abdomen endoscopies without any obvious causes.  Also treated for mesenteritis with antibiotics in the past by PCP.   06/2023 abdominal MRI/MRCP with and without contrast: 1. Normal appearance of the pancreas on today's examination. No pancreatic ductal dilatation or surrounding inflammatory changes. 2. Unchanged chronic appearance of minimal fat stranding in the small bowel mesentery and prominent lymph nodes, consistent with chronic sequelae of mesenteritis (misty mesentery). No specific evidence of active or ongoing inflammation at this time. 3. Colonic diverticulosis. 4. Small hiatal hernia.  04/2023: Upper GI series: *Small sliding hiatal hernia *Several jejunal diverticula.  04/2023 labs: Celiac labs negative.  IgG4 normal.  No evidence of autoimmune pancreatitis.  Normal lipase, CRP, and hepatic panel.  Of note, Her Iron has been elevated.  She has never taken an Iron supplement.  She donated blood to [employer] many months ago (once per year).  Has only donated blood 4 times in her life.  She does not eat Red meat or pork.   GI Work Up by Dr. Teressa at [facility]:  1.  History of precancerous colon polyps.   -Colonoscopy 2016 2 subcentimeter adenomas.   -Repeat Colonoscopy  February 2022 done for minor rectal bleeding.  2 small adenomatous polyps removed. PanDiverticulosis. 7-year repeat (due 08/2027).   2.  Right upper quadrant pain work-up 2022 by Dr. Teressa:   -Lab 11/2020 lipase is slightly elevated 133 and then normalized 1 month later.  CBC normal, complete metabolic profile normal, amylase normal.   -EGD August 2022 minimal distal gastritis.  Biopsies were negative for H. Pylori.  -CT scan abdomen  pelvis WITHOUT IV CONTRAST May 2022 small focal region of geographic mesenteric stranding in the mid abdomen, associated reactive lymph nodes, appearance compatible with mesenteritis.   -Abdominal ultrasound June 2022 cysts in the liver.  Otherwise examination was normal including a normal gallbladder.   -HIDA scan July 2022 was normal.  -CT scan abd/pelvis WITH IV CONTRAST September 2022 did not explain her symptoms.   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  Previous history of endometriosis and exploratory laparotomy, myomectomy, and excision of endometriosis in 2004. Pelvic transvaginal ultrasound 11/07/2022 showed a stable uterine fibroid, otherwise normal.    Current Outpatient Medications  Medication Sig Dispense Refill   Ascorbic Acid (VITAMIN C PO) Take 1,000 mg by mouth in the morning.     Cholecalciferol (VITAMIN D ) 50 MCG (2000 UT) tablet Take 2,000 Units by mouth in the morning.     dicyclomine  (BENTYL ) 10 MG capsule Take 1 capsule (10 mg total) by mouth 3 (three) times daily before meals. 90 capsule 2   estradiol  (ESTRACE  VAGINAL) 0.1 MG/GM vaginal cream Place a pea size on your fingertip and apply to the opening of the vagina twice a week at night. 42.5 g 11   fluorouracil (EFUDEX) 5 % cream Apply 1 Application topically daily as needed (skin spots).     ibuprofen  (ADVIL ) 800 MG tablet Take 1 tablet (800 mg total) by mouth every 8 (eight) hours as needed for moderate pain. 30 tablet 1   lidocaine  (XYLOCAINE ) 5 % ointment Apply 1 Application topically 3 (three) times daily as needed for moderate pain (to the vulva). 35.44 g 1   loperamide  (IMODIUM ) 2 MG capsule Take 1 capsule (2 mg total) by mouth as needed for diarrhea or loose stools. 30 capsule 3   No current facility-administered medications for this visit.    Allergies as of 03/04/2024 - Review Complete 03/04/2024  Allergen Reaction Noted    Sulfonamide derivatives Hives 12/19/2008    Past Medical History:  Diagnosis Date   CIN III (cervical intraepithelial neoplasia III)    Endometriosis    Fibroid    GERD (gastroesophageal reflux disease)    IBS (irritable bowel syndrome)    Iron excess    Osteoporosis 05/2017   T score -2.0 prior DEXA 2016 T score -2.8 AP spine    Past Surgical History:  Procedure Laterality Date   CERVICAL BIOPSY  W/ LOOP ELECTRODE EXCISION  1999   CO2 LASER APPLICATION N/A 06/04/2022   Procedure: CO2 LASER APPLICATION;  Surgeon: Eldonna Mays, MD;  Location: [facility];  Service: Gynecology;  Laterality: N/A;   COLPOSCOPY     EXPLORATORY LAPAROTOMY  2004   myomectomy and excision endometriosis   INCISION AND DRAINAGE ABSCESS N/A 07/30/2022   Procedure: INCISION AND DRAINAGE HEMATOMA OF VULVA WITH PACKING;  Surgeon: Eldonna Mays, MD;  Location: [facility];  Service: Gynecology;  Laterality: N/A;   PELVIC LAPAROSCOPY  1992   x2, for endometriosis   SKIN CANCER EXCISION     Squamous  cell   VULVECTOMY N/A 06/04/2022   Procedure: WIDE EXCISION VULVECTOMY;  Surgeon: Eldonna Mays, MD;  Location: [facility];  Service: Gynecology;  Laterality: N/A;    Review of Systems:    All systems reviewed and negative except where noted in HPI.    Physical Exam:  BP 110/60   Pulse 67   Ht 5' 7 (1.702 m)   Wt 194 lb (88 kg)   LMP 11/02/2012   SpO2 97%   BMI 30.38 kg/m  Patient's last menstrual period was 11/02/2012.  General: Well-nourished, well-developed in no acute distress.  Lungs: Clear to auscultation bilaterally. Non-labored. Heart: Regular rate and rhythm, no murmurs rubs or gallops.  Abdomen: Bowel sounds are normal; Abdomen is Soft; No hepatosplenomegaly, masses or hernias;  No Abdominal Tenderness; There is NO RUQ tenderness on exam today.  No guarding or rebound tenderness.  No skin rashes on the abdomen. Neuro: Alert and oriented x 3.  Grossly intact.  Psych: Alert and  cooperative, normal mood and affect.   Imaging Studies: No results found.  Labs: CBC    Component Value Date/Time   WBC 6.3 04/28/2023 0932   RBC 4.39 04/28/2023 0932   HGB 14.5 04/28/2023 0932   HCT 42.8 04/28/2023 0932   PLT 296 04/28/2023 0932   MCV 97.5 04/28/2023 0932   MCH 33.0 04/28/2023 0932   MCHC 33.9 04/28/2023 0932   RDW 11.7 04/28/2023 0932   LYMPHSABS 2.4 12/21/2020 0930   MONOABS 0.3 12/21/2020 0930   EOSABS 0.1 12/21/2020 0930   BASOSABS 0.0 12/21/2020 0930    CMP     Component Value Date/Time   NA 139 07/30/2022 1205   K 3.7 07/30/2022 1205   CL 103 07/30/2022 1205   CO2 27 07/30/2022 1205   GLUCOSE 100 (H) 07/30/2022 1205   BUN 19 07/30/2022 1205   CREATININE 0.73 07/30/2022 1205   CREATININE 0.71 05/15/2020 1001   CALCIUM 8.8 (L) 07/30/2022 1205   PROT 6.4 04/03/2023 1307   ALBUMIN 4.1 04/03/2023 1307   AST 17 04/03/2023 1307   ALT 18 04/03/2023 1307   ALKPHOS 84 04/03/2023 1307   BILITOT 0.3 04/03/2023 1307   GFRNONAA >60 07/30/2022 1205   GFRAA 118 05/26/2007 0939     Assessment and Plan:   Jennifer Velez is a 58 y.o. y/o female returns for follow-up of chronic RUQ pain.  Extensive GI evaluation unrevealing including labs, EGD, ultrasound, HIDA scan, CTs and MRIs.  Query IBS or colon spasm.  She was diagnosed with IBS many years ago.  1.  Chronic Intermittent RUQ pain since 2022 (for 3 years); uncertain etiology; extensive GI evaluation unrevealing; Suspect possible IBS / colon spasm. - Lab CMP, lipase. - Try dicyclomine  10 mg 3 times daily as needed, #90, 2 refills. - I warned patient about anticholinergic side effects of dicyclomine . - I gave copy of low FODMAP diet to help with gas and bloating.  2.  Mild nonspecific intra-abdominal lymphadenopathy:  07/2023 patient saw Dr. Rennie oncologist to follow-up with mildly enlarged lymph nodes on MRI.  Her results were discussed at the tumor board.  It was thought that the mild  nonspecific intra-abdominal lymphadenopathy was reactive rather than malignant.  It was doubtful that the lymph nodes were causing abdominal pain.  It was recommended for patient to have follow-up abdominal CT in 1 year.  - Plan for CT abdomen pelvis with contrast 07/2024.  3.  Elevated iron, and patient has never taken iron supplement.  Rule out hemochromatosis. - Labs CBC, iron panel, ferritin, hemochromatosis PCR  4.  Mild fecal incontinence; suspect weak anal sphincter or pelvic floor muscles. - Start Benefiber powder 1 or 2 tablespoons in a drink once daily.  5.  History of adenomatous colon polyps -  7-year repeat (due 08/2027).  6.  Internal hemorrhoids resolved s/p banding x 3.    Jennifer Console, PA-C  Follow up in 2 months.

## 2024-03-04 ENCOUNTER — Ambulatory Visit: Admitting: Physician Assistant

## 2024-03-04 ENCOUNTER — Other Ambulatory Visit

## 2024-03-04 ENCOUNTER — Encounter: Payer: Self-pay | Admitting: Physician Assistant

## 2024-03-04 VITALS — BP 110/60 | HR 67 | Ht 67.0 in | Wt 194.0 lb

## 2024-03-04 DIAGNOSIS — R159 Full incontinence of feces: Secondary | ICD-10-CM

## 2024-03-04 DIAGNOSIS — K589 Irritable bowel syndrome without diarrhea: Secondary | ICD-10-CM

## 2024-03-04 DIAGNOSIS — R143 Flatulence: Secondary | ICD-10-CM

## 2024-03-04 DIAGNOSIS — R140 Abdominal distension (gaseous): Secondary | ICD-10-CM

## 2024-03-04 DIAGNOSIS — R1011 Right upper quadrant pain: Secondary | ICD-10-CM

## 2024-03-04 DIAGNOSIS — Z860101 Personal history of adenomatous and serrated colon polyps: Secondary | ICD-10-CM

## 2024-03-04 LAB — CBC WITH DIFFERENTIAL/PLATELET
Basophils Absolute: 0 K/uL (ref 0.0–0.1)
Basophils Relative: 0.6 % (ref 0.0–3.0)
Eosinophils Absolute: 0.1 K/uL (ref 0.0–0.7)
Eosinophils Relative: 1.3 % (ref 0.0–5.0)
HCT: 41.3 % (ref 36.0–46.0)
Hemoglobin: 14.2 g/dL (ref 12.0–15.0)
Lymphocytes Relative: 39.4 % (ref 12.0–46.0)
Lymphs Abs: 2.5 K/uL (ref 0.7–4.0)
MCHC: 34.5 g/dL (ref 30.0–36.0)
MCV: 95.9 fl (ref 78.0–100.0)
Monocytes Absolute: 0.4 K/uL (ref 0.1–1.0)
Monocytes Relative: 7.1 % (ref 3.0–12.0)
Neutro Abs: 3.2 K/uL (ref 1.4–7.7)
Neutrophils Relative %: 51.6 % (ref 43.0–77.0)
Platelets: 277 K/uL (ref 150.0–400.0)
RBC: 4.3 Mil/uL (ref 3.87–5.11)
RDW: 12.7 % (ref 11.5–15.5)
WBC: 6.3 K/uL (ref 4.0–10.5)

## 2024-03-04 LAB — COMPREHENSIVE METABOLIC PANEL WITH GFR
ALT: 19 U/L (ref 0–35)
AST: 17 U/L (ref 0–37)
Albumin: 4.3 g/dL (ref 3.5–5.2)
Alkaline Phosphatase: 69 U/L (ref 39–117)
BUN: 15 mg/dL (ref 6–23)
CO2: 33 meq/L — ABNORMAL HIGH (ref 19–32)
Calcium: 9.1 mg/dL (ref 8.4–10.5)
Chloride: 101 meq/L (ref 96–112)
Creatinine, Ser: 0.75 mg/dL (ref 0.40–1.20)
GFR: 86.96 mL/min (ref >60.00)
Glucose, Bld: 94 mg/dL (ref 70–99)
Potassium: 3.9 meq/L (ref 3.5–5.1)
Sodium: 141 meq/L (ref 135–145)
Total Bilirubin: 0.4 mg/dL (ref 0.2–1.2)
Total Protein: 7.3 g/dL (ref 6.0–8.3)

## 2024-03-04 LAB — C-REACTIVE PROTEIN: CRP: <1 mg/dL (ref 0.5–20.0)

## 2024-03-04 LAB — LIPASE: Lipase: 48 U/L (ref 11.0–59.0)

## 2024-03-04 MED ORDER — DICYCLOMINE HCL 10 MG PO CAPS: 10.0000 mg | ORAL_CAPSULE | Freq: Three times a day (TID) | ORAL | 2 refills | Status: DC

## 2024-03-04 NOTE — Patient Instructions (Addendum)
 Your provider has requested that you go to the basement level for lab work before leaving today. Press B on the elevator. The lab is located at the first door on the left as you exit the elevator.    Start OTC Benefiber Powder. Mix 1 - 2 Tablespoons in 6 - 8 ounces of a Drink Once Daily. Drink 64 ounces of water / fluids Daily.   I have sent a prescription for Dicyclomine  10mg  1 tablet 3 times daily as needed for abdominal pain.   Please try the Dicyclomine  and let me know if it helps your right upper quadrant pain.  Also try Low FODMAP diet for Gas / Bloating / IBS.  Please follow up sooner if symptoms increase or worsen  Due to recent changes in healthcare laws, you may see the results of your imaging and laboratory studies on MyChart before your provider has had a chance to review them.  We understand that in some cases there may be results that are confusing or concerning to you. Not all laboratory results come back in the same time frame and the provider may be waiting for multiple results in order to interpret others.  Please give us  48 hours in order for your provider to thoroughly review all the results before contacting the office for clarification of your results.   Thank you for trusting me with your gastrointestinal care!   Ellouise Console, PA-C _______________________________________________________  If your blood pressure at your visit was 140/90 or greater, please contact your primary care physician to follow up on this.  _______________________________________________________  If you are age 55 or older, your body mass index should be between 23-30. Your Body mass index is 30.38 kg/m. If this is out of the aforementioned range listed, please consider follow up with your Primary Care Provider.  If you are age 74 or younger, your body mass index should be between 19-25. Your Body mass index is 30.38 kg/m. If this is out of the aformentioned range listed, please consider  follow up with your Primary Care Provider.   ________________________________________________________  The [facility] providers would like to encourage you to use MYCHART to communicate with providers for non-urgent requests or questions.  Due to long hold times on the telephone, sending your provider a message by [facility] may be a faster and more efficient way to get a response.  Please allow 48 business hours for a response.  Please remember that this is for non-urgent requests.  _______________________________________________________

## 2024-03-04 NOTE — Progress Notes (Signed)
 Agree with assessment plan as outlined.  If symptoms persist over time, could otherwise be musculoskeletal.  Will await her course with her recommendations as outlined.  Agree with repeat CT 1 year from her last exam.

## 2024-03-09 ENCOUNTER — Ambulatory Visit: Payer: Self-pay | Admitting: Physician Assistant

## 2024-03-12 LAB — IRON,TIBC AND FERRITIN PANEL
%SAT: 65 % — ABNORMAL HIGH (ref 16–45)
Ferritin: 174 ng/mL (ref 16–232)
Iron: 163 ug/dL — ABNORMAL HIGH (ref 45–160)
TIBC: 251 ug/dL (ref 250–450)

## 2024-03-12 LAB — HEMOCHROMATOSIS DNA-PCR(C282Y,H63D)

## 2024-03-23 ENCOUNTER — Inpatient Hospital Stay: Admitting: Nurse Practitioner

## 2024-03-23 ENCOUNTER — Encounter: Payer: Self-pay | Admitting: Nurse Practitioner

## 2024-03-23 ENCOUNTER — Other Ambulatory Visit: Payer: Self-pay

## 2024-03-23 ENCOUNTER — Inpatient Hospital Stay: Attending: Nurse Practitioner

## 2024-03-23 DIAGNOSIS — M810 Age-related osteoporosis without current pathological fracture: Secondary | ICD-10-CM | POA: Diagnosis not present

## 2024-03-23 DIAGNOSIS — K5730 Diverticulosis of large intestine without perforation or abscess without bleeding: Secondary | ICD-10-CM | POA: Diagnosis not present

## 2024-03-23 DIAGNOSIS — R591 Generalized enlarged lymph nodes: Secondary | ICD-10-CM | POA: Insufficient documentation

## 2024-03-23 DIAGNOSIS — R5383 Other fatigue: Secondary | ICD-10-CM | POA: Diagnosis not present

## 2024-03-23 DIAGNOSIS — K449 Diaphragmatic hernia without obstruction or gangrene: Secondary | ICD-10-CM | POA: Diagnosis not present

## 2024-03-23 NOTE — Progress Notes (Signed)
 [facility] CONSULT NOTE  Patient Care Team: Watt Mirza, MD as PCP - General (Family Medicine) [name] Bobie FORBES, MD as Consulting Physician (Obstetrics and Gynecology) Rennie Cindy SAUNDERS, MD as Consulting Physician (Oncology)  CHIEF COMPLAINTS/PURPOSE OF CONSULTATION: Lymphadenopathy  Oncology History   No history exists.   1. Encapsulated misty appearance of the jejunal mesentery with prominent mesenteric lymph nodes similar prior examination, findings are nonspecific but can be seen in the setting of mesenteric panniculitis and may be a cause of patient's abdominal pain. 2. Somewhat encapsulated appearance of the pancreas with loss of arborization of the pancreas, findings are nonspecific and possibly within normal limits for this patient however the finding can be seen with autoimmune pancreatopathy, consider GI consultation.  3. Small hiatal hernia.   Electronically Signed   By: Reyes Holder M.D.   On: 03/28/2023 16:08 Jun 2023- 1. Normal appearance of the pancreas on today's examination. No pancreatic ductal dilatation or surrounding inflammatory changes. 2. Unchanged chronic appearance of minimal fat stranding in the small bowel mesentery and prominent lymph nodes, consistent with chronic sequelae of  mesenteritis (misty mesentery). No specific evidence of active or ongoing inflammation at this time. 3. Colonic diverticulosis. 4. Small hiatal hernia.  HISTORY OF PRESENTING ILLNESS: Patient ambulating-independently. Alone.  Jennifer Velez 58 y.o. female referred for lymphadenopathy.  Saw Dr. Rennie on 08/07/2023.  In interim, imaging was reviewed at case conference and felt to be clinically suspicious for non-maglignant LN (Dec 2024 compared to 2022). Recommendation to repeat in one year for stability.  Unlikely that lymph nodes were causing her abdominal pain.  As part of the workup for her abdominal pain she underwent iron testing  which showed an elevated iron saturation and upper limit of normal ferritin.  This prompted hemochromatosis testing which was found to be positive.  Patient is unaware of any family history of hemochromatosis.  She does not have any biological children.  Her mother has advanced Alzheimer's.  Father is deceased.  She has a maternal aunt and paternal aunt but is unaware of any conditions.  No family history of cirrhosis or liver cancer.  She eats a vegetarian diet.  Reportedly has high iron in her water at home, well water.  She does not take any iron supplements.  Has donated blood in the past, last in June.  Her abdominal pain has improved with any medication from GI.  Does not routinely take Tylenol  or drink alcohol.   Review of Systems  Constitutional:  Positive for malaise/fatigue. Negative for chills, diaphoresis, fever and weight loss.  HENT:  Negative for nosebleeds and sore throat.   Eyes:  Negative for double vision.  Respiratory:  Negative for cough, hemoptysis, sputum production, shortness of breath and wheezing.   Cardiovascular:  Negative for chest pain, palpitations, orthopnea and leg swelling.  Gastrointestinal:  Positive for abdominal pain. Negative for blood in stool, constipation, diarrhea, heartburn, melena, nausea and vomiting.  Genitourinary:  Negative for dysuria, frequency and urgency.  Musculoskeletal:  Negative for back pain and joint pain.  Skin: Negative.  Negative for itching and rash.  Neurological:  Negative for dizziness, tingling, focal weakness, weakness and headaches.  Endo/Heme/Allergies:  Does not bruise/bleed easily.  Psychiatric/Behavioral:  Negative for depression. The patient is not nervous/anxious and does not have insomnia.     MEDICAL HISTORY:  Past Medical History:  Diagnosis Date   CIN III (cervical intraepithelial neoplasia III)    Endometriosis  Fibroid    GERD (gastroesophageal reflux disease)    IBS (irritable bowel syndrome)    Iron  excess    Osteoporosis 05/2017   T score -2.0 prior DEXA 2016 T score -2.8 AP spine    SURGICAL HISTORY: Past Surgical History:  Procedure Laterality Date   CERVICAL BIOPSY  W/ LOOP ELECTRODE EXCISION  1999   CO2 LASER APPLICATION N/A 06/04/2022   Procedure: CO2 LASER APPLICATION;  Surgeon: Eldonna Mays, MD;  Location: [facility];  Service: Gynecology;  Laterality: N/A;   COLPOSCOPY     EXPLORATORY LAPAROTOMY  2004   myomectomy and excision endometriosis   INCISION AND DRAINAGE ABSCESS N/A 07/30/2022   Procedure: INCISION AND DRAINAGE HEMATOMA OF VULVA WITH PACKING;  Surgeon: Eldonna Mays, MD;  Location: [facility];  Service: Gynecology;  Laterality: N/A;   PELVIC LAPAROSCOPY  1992   x2, for endometriosis   SKIN CANCER EXCISION     Squamous cell   VULVECTOMY N/A 06/04/2022   Procedure: WIDE EXCISION VULVECTOMY;  Surgeon: Eldonna Mays, MD;  Location: [facility];  Service: Gynecology;  Laterality: N/A;    SOCIAL HISTORY: Social History   Socioeconomic History   Marital status: Married    Spouse name: Not on file   Number of children: Not on file   Years of education: Not on file   Highest education level: Not on file  Occupational History   Occupation: Home maker  Tobacco Use   Smoking status: Never   Smokeless tobacco: Never  Vaping Use   Vaping status: Never Used  Substance and Sexual Activity   Alcohol use: No    Alcohol/week: 0.0 standard drinks of alcohol   Drug use: No   Sexual activity: Not Currently    Birth control/protection: Post-menopausal    Comment: 1st intercourse 5 yo-5 partners  Other Topics Concern   Not on file  Social History Narrative   3 adopted children, 2 boys and 1 girl   Social Drivers of Corporate investment banker Strain: Not on file  Food Insecurity: Not on file  Transportation Needs: Not on file  Physical Activity: Not on file  Stress: Not on file  Social Connections: Not on file  Intimate Partner Violence: Not  on file    FAMILY HISTORY: Family History  Problem Relation Age of Onset   Diverticulitis Mother    Alzheimer's disease Mother    Alzheimer's disease Maternal Grandmother    Alzheimer's disease Maternal Aunt    Alzheimer's disease Maternal Aunt    Ulcerative colitis Maternal Aunt    Ulcerative colitis Cousin    Colon cancer Neg Hx    Esophageal cancer Neg Hx    Rectal cancer Neg Hx    Stomach cancer Neg Hx    Colon polyps Neg Hx    Breast cancer Neg Hx    Ovarian cancer Neg Hx    Endometrial cancer Neg Hx     ALLERGIES:  is allergic to sulfonamide derivatives.  MEDICATIONS:  Current Outpatient Medications  Medication Sig Dispense Refill   Ascorbic Acid (VITAMIN C PO) Take 1,000 mg by mouth in the morning.     Cholecalciferol (VITAMIN D ) 50 MCG (2000 UT) tablet Take 2,000 Units by mouth in the morning.     dicyclomine  (BENTYL ) 10 MG capsule Take 1 capsule (10 mg total) by mouth 3 (three) times daily before meals. 90 capsule 2   estradiol  (ESTRACE  VAGINAL) 0.1 MG/GM vaginal cream Place a pea size on your fingertip  and apply to the opening of the vagina twice a week at night. 42.5 g 11   fluorouracil (EFUDEX) 5 % cream Apply 1 Application topically daily as needed (skin spots).     ibuprofen  (ADVIL ) 800 MG tablet Take 1 tablet (800 mg total) by mouth every 8 (eight) hours as needed for moderate pain. 30 tablet 1   lidocaine  (XYLOCAINE ) 5 % ointment Apply 1 Application topically 3 (three) times daily as needed for moderate pain (to the vulva). 35.44 g 1   loperamide  (IMODIUM ) 2 MG capsule Take 1 capsule (2 mg total) by mouth as needed for diarrhea or loose stools. 30 capsule 3   No current facility-administered medications for this visit.    PHYSICAL EXAMINATION: ECOG PERFORMANCE STATUS: 0 - Asymptomatic  Vitals:   03/23/24 0950  BP: 109/63  Pulse: 79  Resp: 16  Temp: (!) 97.2 F (36.2 C)  SpO2: 99%   Filed Weights   03/23/24 0950  Weight: 195 lb (88.5 kg)     Physical Exam Vitals and nursing note reviewed.  HENT:     Head: Normocephalic and atraumatic.     Mouth/Throat:     Pharynx: Oropharynx is clear.  Eyes:     Extraocular Movements: Extraocular movements intact.     Pupils: Pupils are equal, round, and reactive to light.  Cardiovascular:     Rate and Rhythm: Normal rate and regular rhythm.  Pulmonary:     Comments: Decreased breath sounds bilaterally.  Abdominal:     Palpations: Abdomen is soft.  Musculoskeletal:        General: Normal range of motion.     Cervical back: Normal range of motion.  Skin:    General: Skin is warm.  Neurological:     General: No focal deficit present.     Mental Status: She is alert and oriented to person, place, and time.  Psychiatric:        Behavior: Behavior normal.        Judgment: Judgment normal.     LABORATORY DATA:  I have reviewed the data as listed Lab Results  Component Value Date   WBC 6.3 03/04/2024   HGB 14.2 03/04/2024   HCT 41.3 03/04/2024   MCV 95.9 03/04/2024   PLT 277.0 03/04/2024   Recent Labs    04/03/23 1307 03/04/24 1130  NA  --  141  K  --  3.9  CL  --  101  CO2  --  33*  GLUCOSE  --  94  BUN  --  15  CREATININE  --  0.75  CALCIUM  --  9.1  PROT 6.4 7.3  ALBUMIN 4.1 4.3  AST 17 17  ALT 18 19  ALKPHOS 84 69  BILITOT 0.3 0.4  BILIDIR <0.10  --    Iron/TIBC/Ferritin/ %Sat    Component Value Date/Time   IRON 163 (H) 03/04/2024 1130   TIBC 251 03/04/2024 1130   FERRITIN 174 03/04/2024 1130   IRONPCTSAT 65 (H) 03/04/2024 1130   Component Ref Range & Units (hover) 2 wk ago  DNA MUTATION ANALYSIS See Below  Comment: RESULT: POSITIVE FOR TWO COPIES OF THE HFE GENE PATHOGENIC VARIANT: C282Y/C282Y (HOMOZYGOTE)    RADIOGRAPHIC STUDIES: I have personally reviewed the radiological images as listed and agreed with the findings in the report. 06/16/23- MR Abdomen FINDINGS: Lower chest: No acute abnormality.  Small hiatal hernia.   Hepatobiliary:  No solid liver abnormality is seen. Multiple simple, benign fluid signal liver cysts,  for which no further follow-up or characterization is required. No gallstones, gallbladder wall thickening, or biliary dilatation.   Pancreas: Normal appearance of the pancreas on today's examination. No pancreatic ductal dilatation or surrounding inflammatory changes.   Spleen: Normal in size without significant abnormality.   Adrenals/Urinary Tract: Adrenal glands are unremarkable. Kidneys are normal, without renal calculi, solid lesion, or hydronephrosis.   Stomach/Bowel: Stomach is within normal limits. No evidence of bowel wall thickening, distention, or inflammatory changes. Ascending and descending colonic diverticulosis.   Vascular/Lymphatic: No significant vascular findings are present. Unchanged chronic appearance of minimal fat stranding in the small bowel mesentery and prominent lymph nodes (series 4, image 26).   Other: No abdominal wall hernia or abnormality. No ascites.   Musculoskeletal: No acute or significant osseous findings.   IMPRESSION: 1. Normal appearance of the pancreas on today's examination. No pancreatic ductal dilatation or surrounding inflammatory changes.  2. Unchanged chronic appearance of minimal fat stranding in the small bowel mesentery and prominent lymph nodes, consistent with chronic sequelae of mesenteritis (misty mesentery). No specific evidence of active or ongoing inflammation at this time. 3. Colonic diverticulosis. 4. Small hiatal hernia.    Electronically Signed   By: Marolyn JONETTA Jaksch M.D.   On: 06/20/2023 07:58   Assessment & Plan:   Hereditary hemochromatosis-patient underwent workup with her GI doctor for right upper quadrant abdominal pain and was found to have elevated iron sat of 65%, upper limit of normal ferritin of 174, serum iron 163.  She underwent genetic testing for hemochromatosis and was found to be positive for 2 copies of the HFE gene  pathogenic variant: C282Y/C282Y (homozygous).  Today, we discussed the inherited nature of hereditary hemochromatosis and I discussed that she received 1 copy from her father and 1 copy from her mother.  HLH/hereditary hemochromatosis is characterized by increased intestinal iron absorption that can cause total body iron overload.  The HFE C282Y variant is quite common however, not all individuals with this variant will develop iron overload.  She does not have biological children parents are deceased or with advanced illness and do not require cascade testing.  We discussed that management includes reducing iron intake to avoid excess levels.  Often HH does not clinically manifest until later in adulthood when significant total body iron accumulation has occurred.  She is no longer menstruating.  She does eat a vegetarian diet which is high in iron.  She has previously donated blood which may have been controlling some of her levels as well.  She does not take oral iron supplements.  Does have well water that is high in iron.  We discussed that phlebotomy is often recommended to maintain normal iron levels.  Recommend ferritin < 100.  Will plan to undergo phlebotomy sometime next week and then again in 3 months.  Since her ferritin levels are not exceedingly high we may be able to manage with community donation which she is interested in.  She has tolerated blood donation well in the past.  Therefore, we will plan for 500 cc phlebotomy.  Discussed that we could add add back fluids if needed.  Since her ferritin is in the 170s now we will not check prior to her next planned phlebotomy.  Plan to recheck levels at her appointment in February when she follows up with Dr. Rennie for lymphadenopathy.  I also discussed the potential risk for development of HCC and recommendation for annual screening.  Plan to check an AFP today and will get  a right upper quadrant abdominal ultrasound.  She had an MRI in December 2024  that did not show any evidence of masses or liver damage.  Disposition:  Abdominal ultrasound Lab today  Phlebotomy next available or patient convenience 3 mo- phlebotomy Pt has appt on 2/6 - change labs to day to week before and add phlebotomy to 2/6 appt- la   No problem-specific Assessment & Plan notes found for this encounter.  Above plan of care was discussed with patient/family in detail.  My contact information was given to the patient/family.     Tinnie KANDICE Dawn, NP 03/23/2024

## 2024-03-24 LAB — AFP TUMOR MARKER: AFP, Serum, Tumor Marker: <1.8 ng/mL (ref 0.0–9.2)

## 2024-03-30 ENCOUNTER — Inpatient Hospital Stay

## 2024-03-30 DIAGNOSIS — R591 Generalized enlarged lymph nodes: Secondary | ICD-10-CM | POA: Diagnosis not present

## 2024-03-30 MED ORDER — SODIUM CHLORIDE 0.9 % IV SOLN
Freq: Once | INTRAVENOUS | Status: DC
  Filled 2024-03-30: qty 250

## 2024-03-30 NOTE — Progress Notes (Signed)
 Jennifer Velez presents today for phlebotomy per MD orders. Phlebotomy procedure started at 0845 and ended at 0905. 500 mls removed. Patient tolerated procedure well. IV needle removed intact.

## 2024-03-30 NOTE — Patient Instructions (Signed)

## 2024-03-31 ENCOUNTER — Ambulatory Visit
Admission: RE | Admit: 2024-03-31 | Discharge: 2024-03-31 | Disposition: A | Discharge status: Home / Self Care | Source: Ambulatory Visit | Attending: Nurse Practitioner | Admitting: Nurse Practitioner

## 2024-04-02 ENCOUNTER — Telehealth: Payer: Self-pay | Admitting: *Deleted

## 2024-04-02 NOTE — Telephone Encounter (Signed)
 Attempted to reach patient to review u/s results with her. Left vm requesting a return ph call and also sent mychart msg.

## 2024-04-13 ENCOUNTER — Ambulatory Visit (INDEPENDENT_AMBULATORY_CARE_PROVIDER_SITE_OTHER)

## 2024-04-13 DIAGNOSIS — Z23 Encounter for immunization: Secondary | ICD-10-CM

## 2024-05-05 ENCOUNTER — Ambulatory Visit: Admitting: Physician Assistant

## 2024-05-17 ENCOUNTER — Other Ambulatory Visit: Payer: Self-pay | Admitting: Obstetrics and Gynecology

## 2024-05-17 DIAGNOSIS — Z1231 Encounter for screening mammogram for malignant neoplasm of breast: Secondary | ICD-10-CM

## 2024-06-14 ENCOUNTER — Ambulatory Visit
Admission: RE | Admit: 2024-06-14 | Discharge: 2024-06-14 | Disposition: A | Source: Ambulatory Visit | Attending: Obstetrics and Gynecology | Admitting: Obstetrics and Gynecology

## 2024-06-14 ENCOUNTER — Inpatient Hospital Stay: Attending: Nurse Practitioner | Admitting: Gynecologic Oncology

## 2024-06-14 VITALS — BP 106/55 | HR 67 | Temp 98.4°F | Resp 19 | Wt 195.4 lb

## 2024-06-14 DIAGNOSIS — N94819 Vulvodynia, unspecified: Secondary | ICD-10-CM | POA: Insufficient documentation

## 2024-06-14 DIAGNOSIS — R159 Full incontinence of feces: Secondary | ICD-10-CM | POA: Diagnosis not present

## 2024-06-14 DIAGNOSIS — D071 Carcinoma in situ of vulva: Secondary | ICD-10-CM

## 2024-06-14 DIAGNOSIS — Z86002 Personal history of in-situ neoplasm of other and unspecified genital organs: Secondary | ICD-10-CM | POA: Insufficient documentation

## 2024-06-14 DIAGNOSIS — Z1231 Encounter for screening mammogram for malignant neoplasm of breast: Secondary | ICD-10-CM

## 2024-06-14 NOTE — Patient Instructions (Addendum)
 It was a pleasure to see you in clinic today. - Normal exam today. There is a bridge of scarring below the entrance of the vagina. When you apply your cream at bedtime, you can lightly massage this area.  - Return visit planned for 6 months - Please let me know if you would like to meet with a pelvic floor physical therapist about the rectal incontinence.    Thank you very much for allowing me to provide care for you today.  I appreciate your confidence in choosing our Gynecologic Oncology team at [facility].  If you have any questions about your visit today please call our office or send us  a MyChart message and we will get back to you as soon as possible.

## 2024-06-14 NOTE — Progress Notes (Unsigned)
 Gynecologic Oncology Return Clinic Visit  Date of Service: 06/14/2024 Referring Provider: Bobie Velez Jennifer JAYSON Nikki, MD   Assessment & Plan: Jennifer Velez is a 58 y.o. 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 S/p wound incision and debridement on 07/30/22, now with complete healing by secondary intention.  VIN3: - Recommend close surveillance with q27mo exams x 57yrs then annually. Recent pap from Sept 2024 negative, HR HPV neg - Exam NED today. - Signs/symptoms of recurrence reviewed  Vulvodynia: -Topical lidocaine  prn - Plans to resume vaginal estrogen more frequently (recommend 2-3x/wk) as she feels this helps.   RTC in 6 mo.   Jennifer Masters, MD Gynecologic Oncology  Medical Decision Making I personally spent  TOTAL 20 minutes face-to-face and non-face-to-face in the care of this patient, which includes all pre, intra, and post visit time on the date of service.  ----------------------- Reason for Visit: Follow-up VIN  Treatment history: Patient noted to have 4 x 2 mm raised area on right vulva at time of annual exam on 03/13/2022. 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 A colposcopy was performed on 04/04/2022 and noted a raised area on the right perineum and a new biopsy obtained which showed VIN 3. She underwent simple partial vulvectomy with rhomboid rotational flap and CO2 laser ablation on 06/04/2022.  Final pathology showing VIN 3, negative margins. Postop course complicated by wound cellulitis requiring antibiotics x2 followed by incision and debridement of left gluteal incision from flap (07/30/22), packed WTD to close by secondary intention.  Interval History: She presents today to the office for continued follow-up.  She reports overall doing well.  She has been dealing with rectal incontinence.  She has seen Jennifer Console, NP with GI.  She was recently diagnosed with hemochromatosis.  She is tolerating her diet with no nausea or emesis.  No change in bladder habits.  No vaginal bleeding or discharge.  No vulvar itching.  She does continue to have vulvar soreness and uses topical lidocaine  and her doughnut pillow for relief.  She has had multiple skin lesions frozen and biopsied by dermatology recently.  He also continues use of topical estrogen.  Uses lidocaine  prior to estrogen application and that seems to help.  They have brought her mom home since march from memory care. And her husband is helping with his parents. Making things harder to coordinate. Using the estrogen once every 3 weeks. No new lesions, itching or bleeding.   Past Medical/Surgical History: Past Medical History:  Diagnosis Date   CIN III (cervical intraepithelial neoplasia III)    Endometriosis    Fibroid    GERD (gastroesophageal reflux disease)    IBS (irritable bowel syndrome)    Iron excess    Osteoporosis 05/2017   T score -2.0 prior DEXA 2016 T score -2.8 AP spine    Past Surgical History:  Procedure Laterality Date   CERVICAL BIOPSY  W/ LOOP ELECTRODE EXCISION  1999   CO2 LASER APPLICATION N/A 06/04/2022   Procedure: CO2 LASER APPLICATION;  Surgeon: Velez Hoy, MD;  Location: [facility];  Service: Gynecology;  Laterality: N/A;   COLPOSCOPY     EXPLORATORY LAPAROTOMY  2004   myomectomy and excision endometriosis   INCISION AND DRAINAGE ABSCESS N/A 07/30/2022   Procedure: INCISION AND DRAINAGE HEMATOMA OF VULVA WITH PACKING;  Surgeon: Velez Hoy, MD;  [facility];  Service: Gynecology;  Laterality: N/A;   PELVIC LAPAROSCOPY  1992   x2, for endometriosis   SKIN CANCER EXCISION     Squamous cell   VULVECTOMY N/A 06/04/2022   Procedure: WIDE EXCISION VULVECTOMY;  Surgeon: Eldonna Mays, MD;  Location: [facility];  Service: Gynecology;  Laterality: N/A;    Family History  Problem  Relation Age of Onset   Diverticulitis Mother    Alzheimer's disease Mother    Alzheimer's disease Maternal Grandmother    Alzheimer's disease Maternal Aunt    Alzheimer's disease Maternal Aunt    Ulcerative colitis Maternal Aunt    Ulcerative colitis Cousin    Colon cancer Neg Hx    Esophageal cancer Neg Hx    Rectal cancer Neg Hx    Stomach cancer Neg Hx    Colon polyps Neg Hx    Breast cancer Neg Hx    Ovarian cancer Neg Hx    Endometrial cancer Neg Hx     Social History   Socioeconomic History   Marital status: Married    Spouse name: Not on file   Number of children: Not on file   Years of education: Not on file   Highest education level: Not on file  Occupational History   Occupation: Home maker  Tobacco Use   Smoking status: Never   Smokeless tobacco: Never  Vaping Use   Vaping status: Never Used  Substance and Sexual Activity   Alcohol use: No    Alcohol/week: 0.0 standard drinks of alcohol   Drug use: No   Sexual activity: Not Currently    Birth control/protection: Post-menopausal    Comment: 1st intercourse 61 yo-5 partners  Other Topics Concern   Not on file  Social History Narrative   3 adopted children, 2 boys and 1 girl   Social Drivers of Health   Tobacco Use: Low Risk (03/23/2024)   Patient History    Smoking Tobacco Use: Never    Smokeless Tobacco Use: Never    Passive Exposure: Not on file  Financial Resource Strain: Not on file  Food Insecurity: Not on file  Transportation Needs: Not on file  Physical Activity: Not on file  Stress: Not on file  Social Connections: Not on file  Depression (PHQ2-9): Low Risk (03/23/2024)   Depression (PHQ2-9)    PHQ-2 Score: 0  Alcohol Screen: Not on file  Housing: Not on file  Utilities: Not on file  Health Literacy: Not on file    Current Medications:  Current Outpatient Medications:    Ascorbic Acid (VITAMIN C PO), Take 1,000 mg by mouth in the morning., Disp: , Rfl:    Cholecalciferol (VITAMIN  D) 50 MCG (2000 UT) tablet, Take 2,000 Units by mouth in the morning., Disp: , Rfl:    dicyclomine  (BENTYL ) 10 MG capsule, Take 1 capsule (10 mg total) by mouth 3 (three) times daily before meals., Disp: 90 capsule, Rfl: 2   estradiol  (ESTRACE  VAGINAL) 0.1 MG/GM vaginal cream, Place a pea size on your fingertip and apply to the opening of the vagina twice a week at night., Disp: 42.5 g, Rfl: 11   fluorouracil (EFUDEX) 5 % cream, Apply 1 Application topically daily as needed (skin spots)., Disp: , Rfl:    ibuprofen  (ADVIL ) 800 MG tablet, Take 1 tablet (800 mg total) by mouth every 8 (eight) hours as needed for moderate pain., Disp: 30 tablet, Rfl: 1   lidocaine  (XYLOCAINE ) 5 % ointment, Apply 1 Application topically 3 (three)  times daily as needed for moderate pain (to the vulva)., Disp: 35.44 g, Rfl: 1   loperamide  (IMODIUM ) 2 MG capsule, Take 1 capsule (2 mg total) by mouth as needed for diarrhea or loose stools., Disp: 30 capsule, Rfl: 3  Review of Symptoms: ROS positive for: vulvar soreness  Physical Exam: LMP 11/02/2012  General: Alert, oriented, no acute distress. HEENT: Normocephalic, atraumatic. Neck symmetric without masses. Sclera anicteric.  Chest: Normal work of breathing. Clear to auscultation bilaterally.   Cardiovascular: Regular rate and rhythm, no murmurs. Abdomen: Soft, nontender.  Normoactive bowel sounds. Extremities: Grossly normal range of motion.  Warm, well perfused.  No edema bilaterally. Skin: No rashes or lesions noted. Lymphatics: No cervical, supraclavicular, or inguinal adenopathy. GU: External genitalia with perineal body in midline completely healed. Gluteal incision with fold of scar but complete healing. No areas concerning for dysplasia on the vulva. Small band like adhesion noted at the introitus. Mild tenderness in this area. Speculum exam with normal vaginal mucosa and cervix. Bimanual exam with normal cervix and uterus. Exam chaperoned by Kimberly Jordan,  CMA   Laboratory & Radiologic Studies: none

## 2024-06-15 ENCOUNTER — Encounter: Payer: Self-pay | Admitting: Nurse Practitioner

## 2024-06-15 ENCOUNTER — Ambulatory Visit

## 2024-06-15 DIAGNOSIS — R159 Full incontinence of feces: Secondary | ICD-10-CM | POA: Insufficient documentation

## 2024-06-18 ENCOUNTER — Ambulatory Visit: Payer: Self-pay | Admitting: Obstetrics and Gynecology

## 2024-06-22 ENCOUNTER — Inpatient Hospital Stay

## 2024-06-22 ENCOUNTER — Encounter

## 2024-06-22 ENCOUNTER — Encounter: Payer: Self-pay | Admitting: Family Medicine

## 2024-06-22 ENCOUNTER — Other Ambulatory Visit: Payer: Self-pay | Admitting: Primary Care

## 2024-06-22 DIAGNOSIS — Z20828 Contact with and (suspected) exposure to other viral communicable diseases: Secondary | ICD-10-CM

## 2024-06-22 MED ORDER — OSELTAMIVIR PHOSPHATE 75 MG PO CAPS: 75.0000 mg | ORAL_CAPSULE | Freq: Every day | ORAL | 0 refills | Status: AC

## 2024-06-22 NOTE — Progress Notes (Signed)
 Evaluated patient's husband to tested positive for influenza A.  She has no symptoms.  Given her exposure we will treat her for prophylaxis.  She agrees.   Prescription for Tamiflu  sent to pharmacy to take once daily x 10 days

## 2024-07-06 ENCOUNTER — Inpatient Hospital Stay: Attending: Nurse Practitioner

## 2024-07-06 DIAGNOSIS — Z86002 Personal history of in-situ neoplasm of other and unspecified genital organs: Secondary | ICD-10-CM | POA: Insufficient documentation

## 2024-07-06 DIAGNOSIS — M810 Age-related osteoporosis without current pathological fracture: Secondary | ICD-10-CM | POA: Insufficient documentation

## 2024-07-06 MED ORDER — SODIUM CHLORIDE 0.9 % IV SOLN
Freq: Once | INTRAVENOUS | Status: AC
  Filled 2024-07-06: qty 250

## 2024-07-06 NOTE — Progress Notes (Signed)
 Patient started to get hot and queasy towards end of phlebotomy. Paused it. She said she wanted to finish it and felt better. Took the last 100, patient states she feels better. BP was a little low (82/49) so I started fluids. Patient has water and ginger ale. Patient states she feels much better and she is ready to go now. BP now 92/51. Patient discharged home. Husband is waiting on her downstairs.

## 2024-07-13 ENCOUNTER — Encounter: Payer: Self-pay | Admitting: Obstetrics and Gynecology

## 2024-07-13 ENCOUNTER — Ambulatory Visit: Payer: Self-pay | Admitting: Obstetrics and Gynecology

## 2024-07-13 ENCOUNTER — Other Ambulatory Visit (HOSPITAL_COMMUNITY)
Admission: RE | Admit: 2024-07-13 | Discharge: 2024-07-13 | Disposition: A | Source: Ambulatory Visit | Attending: Obstetrics and Gynecology | Admitting: Obstetrics and Gynecology

## 2024-07-13 ENCOUNTER — Ambulatory Visit (INDEPENDENT_AMBULATORY_CARE_PROVIDER_SITE_OTHER): Admitting: Obstetrics and Gynecology

## 2024-07-13 VITALS — BP 114/72 | HR 60 | Ht 68.25 in | Wt 193.0 lb

## 2024-07-13 DIAGNOSIS — Z1331 Encounter for screening for depression: Secondary | ICD-10-CM

## 2024-07-13 DIAGNOSIS — Z87410 Personal history of cervical dysplasia: Secondary | ICD-10-CM

## 2024-07-13 DIAGNOSIS — Z01419 Encounter for gynecological examination (general) (routine) without abnormal findings: Secondary | ICD-10-CM | POA: Diagnosis not present

## 2024-07-13 DIAGNOSIS — Z124 Encounter for screening for malignant neoplasm of cervix: Secondary | ICD-10-CM | POA: Insufficient documentation

## 2024-07-13 DIAGNOSIS — Z0000 Encounter for general adult medical examination without abnormal findings: Secondary | ICD-10-CM

## 2024-07-13 DIAGNOSIS — Z87412 Personal history of vulvar dysplasia: Secondary | ICD-10-CM

## 2024-07-13 LAB — LIPID PANEL
Cholesterol: 195 mg/dL
HDL: 43 mg/dL — ABNORMAL LOW
LDL Cholesterol (Calc): 125 mg/dL — ABNORMAL HIGH
Non-HDL Cholesterol (Calc): 152 mg/dL — ABNORMAL HIGH
Total CHOL/HDL Ratio: 4.5 (calc)
Triglycerides: 157 mg/dL — ABNORMAL HIGH

## 2024-07-13 NOTE — Progress Notes (Signed)
 "  59 y.o. [id number] Married Caucasian female here for annual exam.  Wants a pap.   Followed by [facility] for follow up of vulvar dysplasia. Has appointments every 6 months.  Using vaginal estrogen cream 2 times per week.  Uses Lidocaine  and Cerave cream as needed.    Also has accidental leakage of stool.  Seeing GI.    Patient will schedule pelvic floor therapy.  Has hemochromatosis.    Wants cholesterol check.   Mother is in memory care.   PCP: Watt Mirza, MD   Patient's last menstrual period was 11/02/2012.           Sexually active: No.  The current method of family planning is post menopausal status.    Menopausal hormone therapy:  Estrace  Exercising: Yes.    Walking and outside work  Smoker:  no  OB History  Gravida Para Term Preterm AB Living  1 0 0 0 1 0  SAB IAB Ectopic Multiple Live Births  1        # Outcome Date GA Lbr Len/2nd Weight Sex Type Anes PTL Lv  1 SAB              HEALTH MAINTENANCE: Last 2 paps:  03/17/23 neg, HR HPV neg, 04/04/22 neg, HR HPV neg History of abnormal Pap or positive HPV:  no Mammogram:   06/14/24 Breast Density Cat B, BIRADS Cat 1 neg  Colonoscopy:  08/07/20  Bone Density:  10/20/23  Result  osteopenia    Immunization History  Administered Date(s) Administered   Influenza Split 07/13/2011   Influenza, Seasonal, Injecte, Preservative Fre 03/25/2023, 04/13/2024   Influenza,inj,Quad PF,6+ Mos 03/03/2015, 03/14/2018, 03/13/2022   Influenza-Unspecified 03/25/2019   PFIZER(Purple Top)SARS-COV-2 Vaccination 08/05/2019, 08/26/2019, 06/20/2020   Tdap 08/31/2008   Zoster Recombinant(Shingrix) 07/13/2019      reports that she has never smoked. She has never used smokeless tobacco. She reports that she does not drink alcohol and does not use drugs.  Past Medical History:  Diagnosis Date   CIN III (cervical intraepithelial neoplasia III)    Endometriosis    Fibroid    GERD (gastroesophageal reflux disease)    IBS (irritable  bowel syndrome)    Iron excess    Osteoporosis 05/2017   T score -2.0 prior DEXA 2016 T score -2.8 AP spine    Past Surgical History:  Procedure Laterality Date   CERVICAL BIOPSY  W/ LOOP ELECTRODE EXCISION  1999   CO2 LASER APPLICATION N/A 06/04/2022   Procedure: CO2 LASER APPLICATION;  Surgeon: Eldonna Mays, MD;  Location: [facility];  Service: Gynecology;  Laterality: N/A;   COLPOSCOPY     EXPLORATORY LAPAROTOMY  2004   myomectomy and excision endometriosis   INCISION AND DRAINAGE ABSCESS N/A 07/30/2022   Procedure: INCISION AND DRAINAGE HEMATOMA OF VULVA WITH PACKING;  Surgeon: Eldonna Mays, MD;  Location: [facility];  Service: Gynecology;  Laterality: N/A;   PELVIC LAPAROSCOPY  1992   x2, for endometriosis   SKIN CANCER EXCISION     Squamous cell   VULVECTOMY N/A 06/04/2022   Procedure: WIDE EXCISION VULVECTOMY;  Surgeon: Eldonna Mays, MD;  Location: [facility];  Service: Gynecology;  Laterality: N/A;    Current Outpatient Medications  Medication Sig Dispense Refill   Cholecalciferol (VITAMIN D ) 50 MCG (2000 UT) tablet Take 2,000 Units by mouth in the morning.     estradiol  (ESTRACE  VAGINAL) 0.1 MG/GM vaginal cream Place a pea size on your fingertip and apply to  the opening of the vagina twice a week at night. 42.5 g 11   fluorouracil (EFUDEX) 5 % cream Apply 1 Application topically daily as needed (skin spots).     ibuprofen  (ADVIL ) 800 MG tablet Take 1 tablet (800 mg total) by mouth every 8 (eight) hours as needed for moderate pain. 30 tablet 1   lidocaine  (XYLOCAINE ) 5 % ointment Apply 1 Application topically 3 (three) times daily as needed for moderate pain (to the vulva). 35.44 g 1   dicyclomine  (BENTYL ) 10 MG capsule Take 1 capsule (10 mg total) by mouth 3 (three) times daily before meals. (Patient not taking: Reported on 07/13/2024) 90 capsule 2   No current facility-administered medications for this visit.    ALLERGIES: Sulfonamide  derivatives  Family History  Problem Relation Age of Onset   Diverticulitis Mother    Alzheimer's disease Mother    Alzheimer's disease Maternal Grandmother    Alzheimer's disease Maternal Aunt    Alzheimer's disease Maternal Aunt    Ulcerative colitis Maternal Aunt    Ulcerative colitis Cousin    Colon cancer Neg Hx    Esophageal cancer Neg Hx    Rectal cancer Neg Hx    Stomach cancer Neg Hx    Colon polyps Neg Hx    Breast cancer Neg Hx    Ovarian cancer Neg Hx    Endometrial cancer Neg Hx     Review of Systems  All other systems reviewed and are negative.   PHYSICAL EXAM:  BP 114/72 (BP Location: Left Arm, Patient Position: Sitting)   Pulse 60   Ht 5' 8.25 (1.734 m)   Wt 193 lb (87.5 kg)   LMP 11/02/2012   SpO2 99%   BMI 29.13 kg/m     General appearance: alert, cooperative and appears stated age Head: normocephalic, without obvious abnormality, atraumatic Neck: no adenopathy, supple, symmetrical, trachea midline and thyroid normal to inspection and palpation Lungs: clear to auscultation bilaterally Breasts: normal appearance, no masses or tenderness, No nipple retraction or dimpling, No nipple discharge or bleeding, No axillary adenopathy Heart: regular rate and rhythm Abdomen: soft, non-tender; no masses, no organomegaly Extremities: extremities normal, atraumatic, no cyanosis or edema Skin: skin color, texture, turgor normal. No rashes or lesions Lymph nodes: cervical, supraclavicular, and axillary nodes normal. Neurologic: grossly normal  Pelvic: External genitalia:  scar noted.   No lesions.               No abnormal inguinal nodes palpated.              Urethra:  normal appearing urethra with no masses, tenderness or lesions              Bartholins and Skenes: normal                 Vagina: normal appearing vagina with normal color and discharge, no lesions              Cervix: no lesions              Pap taken: yes Bimanual Exam:  Uterus:  normal size,  contour, position, consistency, mobility, non-tender              Adnexa: no mass, fullness, tenderness              Rectal exam: yes.  Confirms.              Anus:  normal sphincter tone, no lesions  Chaperone was present  for exam:  Kari HERO, CMA  ASSESSMENT: Well woman visit with gynecologic exam. Hx CIN III.   Status post LEEP in 1999. Hx VIN III.  Status post simple partial vulvectomy with rhomboid flap and CO2 laser ablation.   Status post debridement with healing by secondary intention.  No sign of recurrence.  Hx uterine fibroid.   Hx prior myomectomy.  Osteopenia.  Off Fosamax  for about 3 years.  Hereditary hemochromatosis.   Routine lab.  PHQ-2-9: 0  PLAN: Mammogram screening discussed. Self breast awareness reviewed. Pap and HRV collected:  yes Guidelines for Calcium, Vitamin D , regular exercise program including cardiovascular and weight bearing exercise. Medication refills:  NA  Receives vaginal estradiol  cream from [facility]. Cholesterol panel.  Follow up:  yearly and prn.            "

## 2024-07-13 NOTE — Patient Instructions (Signed)

## 2024-07-16 LAB — CYTOLOGY - PAP
Comment: NEGATIVE
Diagnosis: NEGATIVE
High risk HPV: NEGATIVE

## 2024-07-29 ENCOUNTER — Inpatient Hospital Stay

## 2024-07-29 ENCOUNTER — Ambulatory Visit
Admission: RE | Admit: 2024-07-29 | Discharge: 2024-07-29 | Disposition: A | Discharge status: Home / Self Care | Payer: 59 | Source: Ambulatory Visit | Attending: Internal Medicine | Admitting: Internal Medicine

## 2024-07-29 DIAGNOSIS — R1084 Generalized abdominal pain: Secondary | ICD-10-CM | POA: Insufficient documentation

## 2024-07-29 DIAGNOSIS — R590 Localized enlarged lymph nodes: Secondary | ICD-10-CM | POA: Diagnosis present

## 2024-07-29 LAB — CBC WITH DIFFERENTIAL (CANCER CENTER ONLY)
Abs Immature Granulocytes: 0.02 10*3/uL (ref 0.00–0.07)
Basophils Absolute: 0 10*3/uL (ref 0.0–0.1)
Basophils Relative: 0 %
Eosinophils Absolute: 0.1 10*3/uL (ref 0.0–0.5)
Eosinophils Relative: 1 %
HCT: 40.2 % (ref 36.0–46.0)
Hemoglobin: 14.3 g/dL (ref 12.0–15.0)
Immature Granulocytes: 0 %
Lymphocytes Relative: 34 %
Lymphs Abs: 2.7 10*3/uL (ref 0.7–4.0)
MCH: 34 pg (ref 26.0–34.0)
MCHC: 35.6 g/dL (ref 30.0–36.0)
MCV: 95.7 fL (ref 80.0–100.0)
Monocytes Absolute: 0.4 10*3/uL (ref 0.1–1.0)
Monocytes Relative: 5 %
Neutro Abs: 4.6 10*3/uL (ref 1.7–7.7)
Neutrophils Relative %: 60 %
Platelet Count: 275 10*3/uL (ref 150–400)
RBC: 4.2 MIL/uL (ref 3.87–5.11)
RDW: 12.4 % (ref 11.5–15.5)
WBC Count: 7.8 10*3/uL (ref 4.0–10.5)
nRBC: 0 % (ref 0.0–0.2)

## 2024-07-29 LAB — IRON AND TIBC
Iron: 104 ug/dL (ref 28–170)
Saturation Ratios: 37 % — ABNORMAL HIGH (ref 10.4–31.8)
TIBC: 284 ug/dL (ref 250–450)
UIBC: 180 ug/dL

## 2024-07-29 LAB — CMP (CANCER CENTER ONLY)
ALT: 25 U/L (ref 0–44)
AST: 21 U/L (ref 15–41)
Albumin: 4.4 g/dL (ref 3.5–5.0)
Alkaline Phosphatase: 81 U/L (ref 38–126)
Anion gap: 11 (ref 5–15)
BUN: 17 mg/dL (ref 6–20)
CO2: 27 mmol/L (ref 22–32)
Calcium: 9.5 mg/dL (ref 8.9–10.3)
Chloride: 99 mmol/L (ref 98–111)
Creatinine: 0.74 mg/dL (ref 0.44–1.00)
GFR, Estimated: >60 mL/min
Glucose, Bld: 137 mg/dL — ABNORMAL HIGH (ref 70–99)
Potassium: 3.9 mmol/L (ref 3.5–5.1)
Sodium: 138 mmol/L (ref 135–145)
Total Bilirubin: 0.4 mg/dL (ref 0.0–1.2)
Total Protein: 7.2 g/dL (ref 6.5–8.1)

## 2024-07-29 LAB — FERRITIN: Ferritin: 194 ng/mL (ref 11–307)

## 2024-07-29 LAB — LACTATE DEHYDROGENASE: LDH: 178 U/L (ref 105–235)

## 2024-07-29 MED ORDER — IOHEXOL 300 MG/ML  SOLN
100.0000 mL | Freq: Once | INTRAMUSCULAR | Status: AC | PRN
  Administered 2024-07-29: 100 mL via INTRAVENOUS

## 2024-08-02 ENCOUNTER — Other Ambulatory Visit

## 2024-08-05 ENCOUNTER — Other Ambulatory Visit: Payer: Self-pay | Admitting: *Deleted

## 2024-08-06 ENCOUNTER — Other Ambulatory Visit: Payer: 59

## 2024-08-06 ENCOUNTER — Encounter

## 2024-08-06 ENCOUNTER — Ambulatory Visit: Payer: 59 | Admitting: Internal Medicine

## 2024-08-06 ENCOUNTER — Ambulatory Visit

## 2024-08-06 ENCOUNTER — Encounter: Payer: Self-pay | Admitting: Internal Medicine

## 2024-08-06 ENCOUNTER — Ambulatory Visit: Attending: Nurse Practitioner | Admitting: Internal Medicine

## 2024-08-06 DIAGNOSIS — R590 Localized enlarged lymph nodes: Secondary | ICD-10-CM

## 2024-08-06 NOTE — Progress Notes (Signed)
No phlebotomy today.

## 2024-08-06 NOTE — Assessment & Plan Note (Signed)
#   Hereditary hemochromatosis- [workup with her GI doctor for right upper quadrant abdominal pain]- and was found to have elevated iron sat of 65%, upper limit of normal ferritin of 174, serum iron 163.  She underwent genetic testing for hemochromatosis and was found to be positive for 2 copies of the HFE gene pathogenic variant: C282Y/C282Y (homozygous).   # Patient had 2 phlebotomies since September September 2025-most recent about 4 weeks ago/in January 2026.  Iron saturation is 37%-ferritin 194.  # Patient has no obvious evidence of iron overload at this time.  Recommend phlebotomy about 3 times a year.  Will recheck labs again in 1 year and consider phlebotomy if significant abnormal.  # T scan-Mild nonspecific intra-abdominal lymphadenopathy  [MRI dec 2024; GI-Dr.Anna] I had a long discussion with the patient regarding the multiple causes of lymphadenopathy includes: proliferation [malignant vs-reactive- infective/inflammatory]. However, clinically suggestive of reactive rather than any malignant appearing lymph nodes. JAn 2026- CT AP-  No acute abdominopelvic abnormality. Stool throughout the colon;  Intramural uterine fibroid     # DISPOSITION:  # No Phlebotomy today # Follow up in 12 months- MD; 2-3 days prior labs- cbc/bmp; iron studies; ferritin- possible phlebotmy Dr.B

## 2024-08-06 NOTE — Assessment & Plan Note (Deleted)
#   Hereditary hemochromatosis-patient underwent workup with her GI doctor for right upper quadrant abdominal pain and was found to have elevated iron sat of 65%, upper limit of normal ferritin of 174, serum iron 163.  She underwent genetic testing for hemochromatosis and was found to be positive for 2 copies of the HFE gene pathogenic variant: C282Y/C282Y (homozygous).  Today, we discussed the inherited nature of hereditary hemochromatosis and I discussed that she received 1 copy from her father and 1 copy from her mother.  HLH/hereditary hemochromatosis is characterized by increased intestinal iron absorption that can cause total body iron overload.  The HFE C282Y variant is quite common however, not all individuals with this variant will develop iron overload.  She does not have biological children parents are deceased or with advanced illness and do not require cascade testing.  We discussed that management includes reducing iron intake to avoid excess levels.  Often HH does not clinically manifest until later in adulthood when significant total body iron accumulation has occurred.  She is no longer menstruating.  She does eat a vegetarian diet which is high in iron.   Mild nonspecific intra-abdominal lymphadenopathy  [MRI dec 2024; GI-Dr.Anna] I had a long discussion with the patient regarding the multiple causes of lymphadenopathy includes: proliferation [malignant vs-reactive- infective/inflammatory]. However, clinically suggestive of reactive rather than any malignant appearing lymph nodes.  IMPRESSION: 1. No acute abdominopelvic abnormality. 2. Stool throughout the colon. 3. Intramural uterine fibroid. 4. Other, non-acute and/or normal findings as above  # I clinically doubt if the small lymph nodes is causing patient abdominal pain. Will review imaging/discuss at tumor conference.   # Abdominal pain-unclear etiology defer to GI    # DISPOSITION:  # No Phlebotomy today # Follow up in 12  months- MD; 2-3 days prior labs- cbc/bmp; iron studies; ferritin- possible phlebotmy Dr.B

## 2024-08-06 NOTE — Progress Notes (Signed)
  [facility] CONSULT NOTE  Patient Care Team: Watt Mirza, MD as PCP - General (Family Medicine) [name] Bobie FORBES, MD as Consulting Physician (Obstetrics and Gynecology) Rennie Cindy SAUNDERS, MD as Consulting Physician (Oncology)  CHIEF COMPLAINTS/PURPOSE OF CONSULTATION: Lymphadenopathy  Oncology History   No problem history exists.   1. Encapsulated misty appearance of the jejunal mesentery with prominent mesenteric lymph nodes similar prior examination, findings are nonspecific but can be seen in the setting of mesenteric panniculitis and may be a cause of patient's abdominal pain. 2. Somewhat encapsulated appearance of the pancreas with loss of arborization of the pancreas, findings are nonspecific and possibly within normal limits for this patient however the finding can be seen with autoimmune pancreatopathy, consider GI consultation. 3. Small hiatal hernia.     Electronically Signed   By: Reyes Holder M.D.   On: 03/28/2023 16:08 Jun 2023- 1. Normal appearance of the pancreas on today's examination. No pancreatic ductal dilatation or surrounding inflammatory changes. 2. Unchanged chronic appearance of minimal fat stranding in the small bowel mesentery and prominent lymph nodes, consistent with chronic sequelae of mesenteritis (misty mesentery). No specific evidence of active or ongoing inflammation at this time. 3. Colonic diverticulosis. 4. Small hiatal hernia.  HISTORY OF PRESENTING ILLNESS: Patient ambulating-independently. Alone.  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  female history of mild mesenteric adenopathy and also diagnosis of hereditary hemochromatosis is here for follow-up  Discussed the use of AI scribe software for clinical note transcription with the patient, who gave verbal consent to proceed.  History of Present Illness   Mikaila Yessenia Maillet is a 59 year old female with hereditary hemochromatosis who presents for follow-up of  iron overload and recent abdominal imaging.  She underwent a recent CT scan to evaluate abdominal symptoms and monitor intra-abdominal pathology. Imaging demonstrated liver cysts and a small uterine fibroid. She recalls being told there was no evidence of persistent or concerning lymphadenopathy. Imaging did not identify new or suspicious lesions, and previously noted lymph nodes are no longer visualized, per her recollection of the report. She expresses relief regarding the stability of her liver cysts and uterine fibroid, and recalls her gynecologist monitoring the fibroid for changes in size.  She continues to experience intermittent, dull right-sided abdominal pain, which is less severe than previously but persists episodically. She is unsure if this pain is related to iron overload, liver cysts, or other abdominal findings. She also reports constipation, managed with Benefiber. She denies nausea but experienced feeling hot and hypotension during her most recent phlebotomy, requiring cessation of the procedure.  She has undergone therapeutic phlebotomy for iron overload, with the most recent session approximately three weeks ago and another in September of the previous year. Her current iron studies are mildly abnormal, and her blood counts remain stable. She previously donated blood at the [facility] two to three times per year and is interested in resuming this practice as an alternative to frequent therapeutic phlebotomy. She is mindful of dietary iron intake, avoids multivitamins containing iron, and currently takes only vitamin D . She has considered discontinuing vitamin C due to its effect on iron absorption but was reassured this is not necessary at this time. She is scheduled for routine follow-up with her primary care provider and plans to request iron studies at that visit.       Review of Systems  Constitutional:  Positive for malaise/fatigue. Negative for chills, diaphoresis, fever and  weight loss.  HENT:  Negative for  nosebleeds and sore throat.   Eyes:  Negative for double vision.  Respiratory:  Negative for cough, hemoptysis, sputum production, shortness of breath and wheezing.   Cardiovascular:  Negative for chest pain, palpitations, orthopnea and leg swelling.  Gastrointestinal:  Positive for abdominal pain. Negative for blood in stool, constipation, diarrhea, heartburn, melena, nausea and vomiting.  Genitourinary:  Negative for dysuria, frequency and urgency.  Musculoskeletal:  Negative for back pain and joint pain.  Skin: Negative.  Negative for itching and rash.  Neurological:  Negative for dizziness, tingling, focal weakness, weakness and headaches.  Endo/Heme/Allergies:  Does not bruise/bleed easily.  Psychiatric/Behavioral:  Negative for depression. The patient is not nervous/anxious and does not have insomnia.     MEDICAL HISTORY:  Past Medical History:  Diagnosis Date   CIN III (cervical intraepithelial neoplasia III)    Endometriosis    Fibroid    GERD (gastroesophageal reflux disease)    IBS (irritable bowel syndrome)    Iron excess    Osteoporosis 05/2017   T score -2.0 prior DEXA 2016 T score -2.8 AP spine    SURGICAL HISTORY: Past Surgical History:  Procedure Laterality Date   CERVICAL BIOPSY  W/ LOOP ELECTRODE EXCISION  1999   CO2 LASER APPLICATION N/A 06/04/2022   Procedure: CO2 LASER APPLICATION;  Surgeon: Eldonna Mays, MD;  Location: [facility];  Service: Gynecology;  Laterality: N/A;   COLPOSCOPY     EXPLORATORY LAPAROTOMY  2004   myomectomy and excision endometriosis   INCISION AND DRAINAGE ABSCESS N/A 07/30/2022   Procedure: INCISION AND DRAINAGE HEMATOMA OF VULVA WITH PACKING;  Surgeon: Eldonna Mays, MD;  Location: [facility] [place];  Service: Gynecology;  Laterality: N/A;   PELVIC LAPAROSCOPY  1992   x2, for endometriosis   SKIN CANCER EXCISION     Squamous cell   VULVECTOMY N/A 06/04/2022   Procedure: WIDE EXCISION  VULVECTOMY;  Surgeon: Eldonna Mays, MD;  Location: [facility];  Service: Gynecology;  Laterality: N/A;    SOCIAL HISTORY: Social History   Socioeconomic History   Marital status: Married    Spouse name: Not on file   Number of children: Not on file   Years of education: Not on file   Highest education level: Not on file  Occupational History   Occupation: Home maker  Tobacco Use   Smoking status: Never   Smokeless tobacco: Never  Vaping Use   Vaping status: Never Used  Substance and Sexual Activity   Alcohol use: No    Alcohol/week: 0.0 standard drinks of alcohol   Drug use: No   Sexual activity: Not Currently    Birth control/protection: Post-menopausal    Comment: 1st intercourse 31 yo-5 partners  Other Topics Concern   Not on file  Social History Narrative   3 adopted children, 2 boys and 1 girl   Social Drivers of Health   Tobacco Use: Low Risk (08/06/2024)   Patient History    Smoking Tobacco Use: Never    Smokeless Tobacco Use: Never    Passive Exposure: Not on file  Financial Resource Strain: Not on file  Food Insecurity: Not on file  Transportation Needs: Not on file  Physical Activity: Not on file  Stress: Not on file  Social Connections: Not on file  Intimate Partner Violence: Not on file  Depression (EYV7-0): Low Risk (08/06/2024)   Depression (PHQ2-9)    PHQ-2 Score: 0  Alcohol Screen: Not on file  Housing: Not on file  Utilities: Not on file  Health Literacy: Not on file    FAMILY HISTORY: Family History  Problem Relation Age of Onset   Diverticulitis Mother    Alzheimer's disease Mother    Alzheimer's disease Maternal Grandmother    Alzheimer's disease Maternal Aunt    Alzheimer's disease Maternal Aunt    Ulcerative colitis Maternal Aunt    Ulcerative colitis Cousin    Colon cancer Neg Hx    Esophageal cancer Neg Hx    Rectal cancer Neg Hx    Stomach cancer Neg Hx    Colon polyps Neg Hx    Breast cancer Neg Hx    Ovarian cancer Neg Hx     Endometrial cancer Neg Hx     ALLERGIES:  is allergic to sulfonamide derivatives.  MEDICATIONS:  Current Outpatient Medications  Medication Sig Dispense Refill   Cholecalciferol (VITAMIN D ) 50 MCG (2000 UT) tablet Take 2,000 Units by mouth in the morning.     No current facility-administered medications for this visit.    PHYSICAL EXAMINATION: ECOG PERFORMANCE STATUS: 0 - Asymptomatic  Vitals:   08/06/24 0953  BP: (!) 114/52  Pulse: 65  Resp: 18  Temp: (!) 97.3 F (36.3 C)  SpO2: 98%   Filed Weights   08/06/24 0953  Weight: 196 lb 8 oz (89.1 kg)    Physical Exam Vitals and nursing note reviewed.  HENT:     Head: Normocephalic and atraumatic.     Mouth/Throat:     Pharynx: Oropharynx is clear.  Eyes:     Extraocular Movements: Extraocular movements intact.     Pupils: Pupils are equal, round, and reactive to light.  Cardiovascular:     Rate and Rhythm: Normal rate and regular rhythm.  Pulmonary:     Comments: Decreased breath sounds bilaterally.  Abdominal:     Palpations: Abdomen is soft.  Musculoskeletal:        General: Normal range of motion.     Cervical back: Normal range of motion.  Skin:    General: Skin is warm.  Neurological:     General: No focal deficit present.     Mental Status: She is alert and oriented to person, place, and time.  Psychiatric:        Behavior: Behavior normal.        Judgment: Judgment normal.     LABORATORY DATA:  I have reviewed the data as listed Lab Results  Component Value Date   WBC 7.8 07/29/2024   HGB 14.3 07/29/2024   HCT 40.2 07/29/2024   MCV 95.7 07/29/2024   PLT 275 07/29/2024   Recent Labs    03/04/24 1130 07/29/24 1331  NA 141 138  K 3.9 3.9  CL 101 99  CO2 33* 27  GLUCOSE 94 137*  BUN 15 17  CREATININE 0.75 0.74  CALCIUM 9.1 9.5  GFRNONAA  --  >60  PROT 7.3 7.2  ALBUMIN 4.3 4.4  AST 17 21  ALT 19 25  ALKPHOS 69 81  BILITOT 0.4 0.4    RADIOGRAPHIC STUDIES: I have personally  reviewed the radiological images as listed and agreed with the findings in the report. CT ABDOMEN PELVIS W CONTRAST Result Date: 08/01/2024 EXAM: CT ABDOMEN AND PELVIS WITH CONTRAST 07/29/2024 12:37:42 PM TECHNIQUE: CT of the abdomen and pelvis was performed with the administration of 100 mL of iohexol  (OMNIPAQUE ) 300 MG/ML solution. Multiplanar reformatted images are provided for review. Automated exposure control, iterative reconstruction, and/or weight-based adjustment of the mA/kV was utilized to reduce the  radiation dose to as low as reasonably achievable. COMPARISON: 03/01/21 ct a/p, mri abd 06/16/23 CLINICAL HISTORY: RLQ abdominal pain; abdominal lympahdenopathy. Right lower quadrant abdominal pain; abdominal lymphadenopathy. FINDINGS: LOWER CHEST: No acute abnormality. LIVER: Similar fluid density lesions within the bilateral, left greater than right, hepatic lobe may represent simple cysts versus hepatic hemangiomas. GALLBLADDER AND BILE DUCTS: Gallbladder is unremarkable. No biliary ductal dilatation. SPLEEN: No acute abnormality. PANCREAS: No acute abnormality. ADRENAL GLANDS: No acute abnormality. KIDNEYS, URETERS AND BLADDER: No stones in the kidneys or ureters. No hydronephrosis. No perinephric or periureteral stranding. Urinary bladder is unremarkable. No filling defects of the partially visualized collecting systems on delayed imaging. GI AND BOWEL: Stomach demonstrates no acute abnormality. There is no bowel obstruction. No small or large bowel thickening or dilatation. The appendix is unremarkable. Colonic diverticulosis. Small hiatal hernia. Stool throughout the colon. PERITONEUM AND RETROPERITONEUM: No ascites. No free air. VASCULATURE: Aorta is normal in caliber. LYMPH NODES: No lymphadenopathy. REPRODUCTIVE ORGANS: A 4.6 mm lesion within the anterior wall of the uterus likely represents a uterine fibroid. The uterus is otherwise unremarkable. No adnexal mass. BONES AND SOFT TISSUES: No acute  osseous abnormality. No focal soft tissue abnormality. IMPRESSION: 1. No acute abdominopelvic abnormality. 2. Stool throughout the colon. 3. Intramural uterine fibroid. 4. Other, non-acute and/or normal findings as above. Electronically signed by: Morgane Naveau MD 08/01/2024 11:31 AM EST RP Workstation: [id number]     Hereditary hemochromatosis # Hereditary hemochromatosis- [workup with her GI doctor for right upper quadrant abdominal pain]- and was found to have elevated iron sat of 65%, upper limit of normal ferritin of 174, serum iron 163.  She underwent genetic testing for hemochromatosis and was found to be positive for 2 copies of the HFE gene pathogenic variant: C282Y/C282Y (homozygous).   # Patient had 2 phlebotomies since September September 2025-most recent about 4 weeks ago/in January 2026.  Iron saturation is 37%-ferritin 194.  # Patient has no obvious evidence of iron overload at this time.  Recommend phlebotomy about 3 times a year.  Will recheck labs again in 1 year and consider phlebotomy if significant abnormal.  # T scan-Mild nonspecific intra-abdominal lymphadenopathy  [MRI dec 2024; GI-Dr.Anna] I had a long discussion with the patient regarding the multiple causes of lymphadenopathy includes: proliferation [malignant vs-reactive- infective/inflammatory]. However, clinically suggestive of reactive rather than any malignant appearing lymph nodes. JAn 2026- CT AP-  No acute abdominopelvic abnormality. Stool throughout the colon;  Intramural uterine fibroid     # DISPOSITION:  # No Phlebotomy today # Follow up in 12 months- MD; 2-3 days prior labs- cbc/bmp; iron studies; ferritin- possible phlebotmy Dr.B   Above plan of care was discussed with patient/family in detail.  My contact information was given to the patient/family.     Scotty Pinder R Cloee Dunwoody, MD 08/06/2024 1:19 PM

## 2024-08-06 NOTE — Progress Notes (Signed)
 CT ABD/PEL 07/29/24.

## 2024-12-20 ENCOUNTER — Inpatient Hospital Stay: Attending: Nurse Practitioner | Admitting: Psychiatry

## 2025-07-14 ENCOUNTER — Ambulatory Visit: Admitting: Obstetrics and Gynecology

## 2025-08-03 ENCOUNTER — Inpatient Hospital Stay

## 2025-08-05 ENCOUNTER — Inpatient Hospital Stay: Admitting: Internal Medicine

## 2025-08-05 ENCOUNTER — Inpatient Hospital Stay
